# Patient Record
Sex: Male | Born: 1935 | Race: White | Hispanic: No | Smoking: Former smoker
Health system: Southern US, Community
[De-identification: ages and names within clinical notes are randomized; demographics above are authoritative.]

## PROBLEM LIST (undated history)

## (undated) DIAGNOSIS — C801 Malignant (primary) neoplasm, unspecified: Secondary | ICD-10-CM

## (undated) DIAGNOSIS — I00 Rheumatic fever without heart involvement: Secondary | ICD-10-CM

## (undated) DIAGNOSIS — H509 Unspecified strabismus: Secondary | ICD-10-CM

## (undated) HISTORY — PX: APPENDECTOMY: SHX54

## (undated) HISTORY — PX: CHOLECYSTECTOMY: SHX55

## (undated) HISTORY — DX: Rheumatic fever without heart involvement: I00

## (undated) HISTORY — PX: TOTAL HIP ARTHROPLASTY: SHX124

## (undated) HISTORY — PX: NASAL FRACTURE SURGERY: SHX718

## (undated) HISTORY — PX: LOBECTOMY: SHX5089

## (undated) HISTORY — PX: TONSILLECTOMY: SUR1361

## (undated) HISTORY — PX: OTHER SURGICAL HISTORY: SHX169

## (undated) HISTORY — DX: Unspecified strabismus: H50.9

---

## 1998-02-14 ENCOUNTER — Inpatient Hospital Stay (HOSPITAL_COMMUNITY): Admission: RE | Admit: 1998-02-14 | Discharge: 1998-02-17 | Payer: Self-pay

## 2003-03-23 ENCOUNTER — Ambulatory Visit (HOSPITAL_BASED_OUTPATIENT_CLINIC_OR_DEPARTMENT_OTHER): Admission: RE | Admit: 2003-03-23 | Discharge: 2003-03-23 | Payer: Self-pay | Admitting: *Deleted

## 2004-11-06 ENCOUNTER — Ambulatory Visit: Payer: Self-pay | Admitting: Internal Medicine

## 2004-11-22 ENCOUNTER — Ambulatory Visit: Payer: Self-pay | Admitting: Internal Medicine

## 2004-11-23 ENCOUNTER — Ambulatory Visit: Payer: Self-pay | Admitting: Internal Medicine

## 2005-02-19 ENCOUNTER — Ambulatory Visit: Payer: Self-pay | Admitting: Internal Medicine

## 2005-05-27 ENCOUNTER — Inpatient Hospital Stay (HOSPITAL_COMMUNITY): Admission: RE | Admit: 2005-05-27 | Discharge: 2005-05-30 | Payer: Self-pay | Admitting: Orthopaedic Surgery

## 2005-06-07 ENCOUNTER — Ambulatory Visit: Payer: Self-pay | Admitting: Internal Medicine

## 2005-06-21 ENCOUNTER — Ambulatory Visit: Payer: Self-pay | Admitting: Internal Medicine

## 2005-06-24 ENCOUNTER — Ambulatory Visit (HOSPITAL_COMMUNITY): Admission: RE | Admit: 2005-06-24 | Discharge: 2005-06-24 | Payer: Self-pay | Admitting: Internal Medicine

## 2005-06-27 ENCOUNTER — Ambulatory Visit (HOSPITAL_COMMUNITY): Admission: RE | Admit: 2005-06-27 | Discharge: 2005-06-27 | Payer: Self-pay | Admitting: Internal Medicine

## 2005-07-30 ENCOUNTER — Encounter (INDEPENDENT_AMBULATORY_CARE_PROVIDER_SITE_OTHER): Payer: Self-pay | Admitting: *Deleted

## 2005-07-30 ENCOUNTER — Inpatient Hospital Stay (HOSPITAL_COMMUNITY): Admission: RE | Admit: 2005-07-30 | Discharge: 2005-08-04 | Payer: Self-pay | Admitting: Thoracic Surgery

## 2005-08-01 ENCOUNTER — Ambulatory Visit: Payer: Self-pay | Admitting: Internal Medicine

## 2005-08-02 ENCOUNTER — Ambulatory Visit: Payer: Self-pay | Admitting: Internal Medicine

## 2005-08-13 ENCOUNTER — Encounter: Admission: RE | Admit: 2005-08-13 | Discharge: 2005-08-13 | Payer: Self-pay | Admitting: Thoracic Surgery

## 2005-08-28 ENCOUNTER — Encounter: Admission: RE | Admit: 2005-08-28 | Discharge: 2005-08-28 | Payer: Self-pay | Admitting: Thoracic Surgery

## 2005-09-25 ENCOUNTER — Ambulatory Visit: Payer: Self-pay | Admitting: Internal Medicine

## 2005-10-16 ENCOUNTER — Ambulatory Visit: Admission: RE | Admit: 2005-10-16 | Discharge: 2006-01-14 | Payer: Self-pay | Admitting: Radiation Oncology

## 2005-10-16 ENCOUNTER — Encounter: Admission: RE | Admit: 2005-10-16 | Discharge: 2005-10-16 | Payer: Self-pay | Admitting: Thoracic Surgery

## 2005-11-05 ENCOUNTER — Encounter (INDEPENDENT_AMBULATORY_CARE_PROVIDER_SITE_OTHER): Payer: Self-pay | Admitting: Specialist

## 2005-11-05 ENCOUNTER — Inpatient Hospital Stay (HOSPITAL_COMMUNITY): Admission: RE | Admit: 2005-11-05 | Discharge: 2005-11-09 | Payer: Self-pay | Admitting: Thoracic Surgery

## 2005-11-07 DIAGNOSIS — C3492 Malignant neoplasm of unspecified part of left bronchus or lung: Secondary | ICD-10-CM

## 2005-11-19 ENCOUNTER — Ambulatory Visit: Payer: Self-pay | Admitting: Internal Medicine

## 2005-11-19 ENCOUNTER — Encounter: Admission: RE | Admit: 2005-11-19 | Discharge: 2005-11-19 | Payer: Self-pay | Admitting: Thoracic Surgery

## 2005-12-11 ENCOUNTER — Encounter: Admission: RE | Admit: 2005-12-11 | Discharge: 2005-12-11 | Payer: Self-pay | Admitting: Family Medicine

## 2005-12-24 ENCOUNTER — Ambulatory Visit: Payer: Self-pay | Admitting: Internal Medicine

## 2006-02-19 ENCOUNTER — Encounter: Admission: RE | Admit: 2006-02-19 | Discharge: 2006-02-19 | Payer: Self-pay | Admitting: Thoracic Surgery

## 2006-02-27 ENCOUNTER — Ambulatory Visit: Payer: Self-pay | Admitting: Internal Medicine

## 2006-03-03 LAB — CBC WITH DIFFERENTIAL/PLATELET
BASO%: 0.1 % (ref 0.0–2.0)
Eosinophils Absolute: 0.3 10*3/uL (ref 0.0–0.5)
HCT: 40.7 % (ref 38.7–49.9)
HGB: 14.5 g/dL (ref 13.0–17.1)
LYMPH%: 6.8 % — ABNORMAL LOW (ref 14.0–48.0)
MCHC: 35.5 g/dL (ref 32.0–35.9)
MONO#: 1 10*3/uL — ABNORMAL HIGH (ref 0.1–0.9)
NEUT#: 9.2 10*3/uL — ABNORMAL HIGH (ref 1.5–6.5)
NEUT%: 81.3 % — ABNORMAL HIGH (ref 40.0–75.0)
Platelets: 323 10*3/uL (ref 145–400)
WBC: 11.3 10*3/uL — ABNORMAL HIGH (ref 4.0–10.0)
lymph#: 0.8 10*3/uL — ABNORMAL LOW (ref 0.9–3.3)

## 2006-03-03 LAB — COMPREHENSIVE METABOLIC PANEL
ALT: 15 U/L (ref 0–40)
CO2: 24 mEq/L (ref 19–32)
Calcium: 9.4 mg/dL (ref 8.4–10.5)
Chloride: 104 mEq/L (ref 96–112)
Creatinine, Ser: 1.04 mg/dL (ref 0.40–1.50)
Glucose, Bld: 108 mg/dL — ABNORMAL HIGH (ref 70–99)
Total Bilirubin: 0.6 mg/dL (ref 0.3–1.2)
Total Protein: 6.7 g/dL (ref 6.0–8.3)

## 2006-03-06 ENCOUNTER — Ambulatory Visit (HOSPITAL_COMMUNITY): Admission: RE | Admit: 2006-03-06 | Discharge: 2006-03-06 | Payer: Self-pay | Admitting: Internal Medicine

## 2006-03-26 ENCOUNTER — Ambulatory Visit: Payer: Self-pay | Admitting: Internal Medicine

## 2006-05-21 ENCOUNTER — Encounter: Admission: RE | Admit: 2006-05-21 | Discharge: 2006-05-21 | Payer: Self-pay | Admitting: Thoracic Surgery

## 2006-07-04 ENCOUNTER — Ambulatory Visit: Payer: Self-pay | Admitting: Internal Medicine

## 2006-07-31 ENCOUNTER — Ambulatory Visit: Payer: Self-pay | Admitting: Internal Medicine

## 2006-08-29 ENCOUNTER — Ambulatory Visit: Payer: Self-pay | Admitting: Internal Medicine

## 2006-09-03 LAB — CBC WITH DIFFERENTIAL/PLATELET
BASO%: 0.2 % (ref 0.0–2.0)
Basophils Absolute: 0 10*3/uL (ref 0.0–0.1)
HCT: 40.5 % (ref 38.7–49.9)
HGB: 14.2 g/dL (ref 13.0–17.1)
LYMPH%: 10.5 % — ABNORMAL LOW (ref 14.0–48.0)
MCHC: 35 g/dL (ref 32.0–35.9)
MONO#: 1.1 10*3/uL — ABNORMAL HIGH (ref 0.1–0.9)
NEUT%: 74.3 % (ref 40.0–75.0)
Platelets: 323 10*3/uL (ref 145–400)
WBC: 10.5 10*3/uL — ABNORMAL HIGH (ref 4.0–10.0)
lymph#: 1.1 10*3/uL (ref 0.9–3.3)

## 2006-09-03 LAB — COMPREHENSIVE METABOLIC PANEL
BUN: 19 mg/dL (ref 6–23)
CO2: 28 mEq/L (ref 19–32)
Calcium: 9.2 mg/dL (ref 8.4–10.5)
Chloride: 105 mEq/L (ref 96–112)
Creatinine, Ser: 1.06 mg/dL (ref 0.40–1.50)
Glucose, Bld: 95 mg/dL (ref 70–99)
Total Bilirubin: 0.6 mg/dL (ref 0.3–1.2)

## 2006-09-05 ENCOUNTER — Ambulatory Visit (HOSPITAL_COMMUNITY): Admission: RE | Admit: 2006-09-05 | Discharge: 2006-09-05 | Payer: Self-pay | Admitting: Internal Medicine

## 2006-10-22 ENCOUNTER — Ambulatory Visit: Payer: Self-pay | Admitting: Internal Medicine

## 2006-10-28 ENCOUNTER — Encounter: Admission: RE | Admit: 2006-10-28 | Discharge: 2006-10-28 | Payer: Self-pay | Admitting: Thoracic Surgery

## 2006-10-28 ENCOUNTER — Ambulatory Visit: Payer: Self-pay | Admitting: Thoracic Surgery

## 2007-01-23 ENCOUNTER — Ambulatory Visit: Payer: Self-pay | Admitting: Internal Medicine

## 2007-03-02 ENCOUNTER — Ambulatory Visit: Payer: Self-pay | Admitting: Internal Medicine

## 2007-03-04 LAB — CBC WITH DIFFERENTIAL/PLATELET
BASO%: 0.5 % (ref 0.0–2.0)
Eosinophils Absolute: 0.3 10*3/uL (ref 0.0–0.5)
HCT: 41.1 % (ref 38.7–49.9)
MCHC: 35.8 g/dL (ref 32.0–35.9)
MONO#: 0.9 10*3/uL (ref 0.1–0.9)
NEUT#: 8.6 10*3/uL — ABNORMAL HIGH (ref 1.5–6.5)
RBC: 4.56 10*6/uL (ref 4.20–5.71)
WBC: 11.2 10*3/uL — ABNORMAL HIGH (ref 4.0–10.0)
lymph#: 1.3 10*3/uL (ref 0.9–3.3)

## 2007-03-04 LAB — COMPREHENSIVE METABOLIC PANEL
Albumin: 4.2 g/dL (ref 3.5–5.2)
Alkaline Phosphatase: 103 U/L (ref 39–117)
BUN: 15 mg/dL (ref 6–23)
CO2: 26 mEq/L (ref 19–32)
Glucose, Bld: 114 mg/dL — ABNORMAL HIGH (ref 70–99)
Potassium: 4.2 mEq/L (ref 3.5–5.3)
Total Bilirubin: 0.5 mg/dL (ref 0.3–1.2)

## 2007-03-06 ENCOUNTER — Ambulatory Visit (HOSPITAL_COMMUNITY): Admission: RE | Admit: 2007-03-06 | Discharge: 2007-03-06 | Payer: Self-pay | Admitting: Internal Medicine

## 2007-04-22 ENCOUNTER — Ambulatory Visit: Payer: Self-pay | Admitting: Internal Medicine

## 2007-04-28 ENCOUNTER — Encounter: Admission: RE | Admit: 2007-04-28 | Discharge: 2007-04-28 | Payer: Self-pay | Admitting: Thoracic Surgery

## 2007-04-28 ENCOUNTER — Ambulatory Visit: Payer: Self-pay | Admitting: Thoracic Surgery

## 2007-08-03 ENCOUNTER — Ambulatory Visit: Payer: Self-pay | Admitting: Internal Medicine

## 2007-08-31 ENCOUNTER — Ambulatory Visit: Payer: Self-pay | Admitting: Internal Medicine

## 2007-09-02 LAB — COMPREHENSIVE METABOLIC PANEL
Albumin: 4.3 g/dL (ref 3.5–5.2)
BUN: 15 mg/dL (ref 6–23)
CO2: 25 mEq/L (ref 19–32)
Calcium: 9.5 mg/dL (ref 8.4–10.5)
Chloride: 104 mEq/L (ref 96–112)
Glucose, Bld: 97 mg/dL (ref 70–99)
Potassium: 4.7 mEq/L (ref 3.5–5.3)
Sodium: 140 mEq/L (ref 135–145)
Total Protein: 7.1 g/dL (ref 6.0–8.3)

## 2007-09-02 LAB — CBC WITH DIFFERENTIAL/PLATELET
Basophils Absolute: 0 10*3/uL (ref 0.0–0.1)
Eosinophils Absolute: 0.5 10*3/uL (ref 0.0–0.5)
HGB: 14.4 g/dL (ref 13.0–17.1)
MCV: 90.1 fL (ref 81.6–98.0)
MONO#: 0.7 10*3/uL (ref 0.1–0.9)
MONO%: 6.5 % (ref 0.0–13.0)
NEUT#: 8.6 10*3/uL — ABNORMAL HIGH (ref 1.5–6.5)
RBC: 4.52 10*6/uL (ref 4.20–5.71)
RDW: 12.8 % (ref 11.2–14.6)
WBC: 11.6 10*3/uL — ABNORMAL HIGH (ref 4.0–10.0)
lymph#: 1.7 10*3/uL (ref 0.9–3.3)

## 2007-09-04 ENCOUNTER — Ambulatory Visit (HOSPITAL_COMMUNITY): Admission: RE | Admit: 2007-09-04 | Discharge: 2007-09-04 | Payer: Self-pay | Admitting: Internal Medicine

## 2007-09-15 ENCOUNTER — Ambulatory Visit: Payer: Self-pay | Admitting: Thoracic Surgery

## 2007-09-19 DIAGNOSIS — J302 Other seasonal allergic rhinitis: Secondary | ICD-10-CM

## 2007-09-19 DIAGNOSIS — K439 Ventral hernia without obstruction or gangrene: Secondary | ICD-10-CM | POA: Insufficient documentation

## 2007-09-19 DIAGNOSIS — C3490 Malignant neoplasm of unspecified part of unspecified bronchus or lung: Secondary | ICD-10-CM | POA: Insufficient documentation

## 2007-09-19 DIAGNOSIS — K631 Perforation of intestine (nontraumatic): Secondary | ICD-10-CM

## 2007-09-19 DIAGNOSIS — H1045 Other chronic allergic conjunctivitis: Secondary | ICD-10-CM

## 2007-09-19 DIAGNOSIS — J3089 Other allergic rhinitis: Secondary | ICD-10-CM

## 2007-09-21 ENCOUNTER — Ambulatory Visit: Payer: Self-pay | Admitting: Internal Medicine

## 2007-11-11 ENCOUNTER — Ambulatory Visit: Payer: Self-pay | Admitting: Internal Medicine

## 2008-02-16 ENCOUNTER — Ambulatory Visit: Payer: Self-pay | Admitting: Internal Medicine

## 2008-03-01 ENCOUNTER — Ambulatory Visit: Payer: Self-pay | Admitting: Internal Medicine

## 2008-03-03 ENCOUNTER — Ambulatory Visit (HOSPITAL_COMMUNITY): Admission: RE | Admit: 2008-03-03 | Discharge: 2008-03-03 | Payer: Self-pay | Admitting: Internal Medicine

## 2008-03-03 LAB — COMPREHENSIVE METABOLIC PANEL
ALT: 22 U/L (ref 0–53)
AST: 30 U/L (ref 0–37)
BUN: 10 mg/dL (ref 6–23)
CO2: 27 mEq/L (ref 19–32)
Creatinine, Ser: 1.02 mg/dL (ref 0.40–1.50)
Total Bilirubin: 0.9 mg/dL (ref 0.3–1.2)

## 2008-03-03 LAB — CBC WITH DIFFERENTIAL/PLATELET
Basophils Absolute: 0 10*3/uL (ref 0.0–0.1)
EOS%: 4.8 % (ref 0.0–7.0)
HGB: 15.8 g/dL (ref 13.0–17.1)
MCH: 32.4 pg (ref 28.0–33.4)
MONO%: 9 % (ref 0.0–13.0)
NEUT#: 7.3 10*3/uL — ABNORMAL HIGH (ref 1.5–6.5)
RBC: 4.88 10*6/uL (ref 4.20–5.71)
RDW: 13.6 % (ref 11.2–14.6)
lymph#: 1.5 10*3/uL (ref 0.9–3.3)

## 2008-03-10 ENCOUNTER — Ambulatory Visit: Payer: Self-pay | Admitting: Thoracic Surgery

## 2008-06-01 ENCOUNTER — Ambulatory Visit: Payer: Self-pay | Admitting: Internal Medicine

## 2008-08-31 ENCOUNTER — Ambulatory Visit: Payer: Self-pay | Admitting: Internal Medicine

## 2008-09-02 ENCOUNTER — Ambulatory Visit (HOSPITAL_COMMUNITY): Admission: RE | Admit: 2008-09-02 | Discharge: 2008-09-02 | Payer: Self-pay | Admitting: Internal Medicine

## 2008-09-02 LAB — COMPREHENSIVE METABOLIC PANEL
AST: 33 U/L (ref 0–37)
Albumin: 4 g/dL (ref 3.5–5.2)
BUN: 14 mg/dL (ref 6–23)
Calcium: 9.3 mg/dL (ref 8.4–10.5)
Chloride: 103 mEq/L (ref 96–112)
Glucose, Bld: 115 mg/dL — ABNORMAL HIGH (ref 70–99)
Potassium: 3.9 mEq/L (ref 3.5–5.3)
Sodium: 136 mEq/L (ref 135–145)
Total Protein: 7 g/dL (ref 6.0–8.3)

## 2008-09-02 LAB — CBC WITH DIFFERENTIAL/PLATELET
Basophils Absolute: 0 10*3/uL (ref 0.0–0.1)
Eosinophils Absolute: 0.4 10*3/uL (ref 0.0–0.5)
HGB: 15.6 g/dL (ref 13.0–17.1)
NEUT#: 8.8 10*3/uL — ABNORMAL HIGH (ref 1.5–6.5)
RBC: 4.64 10*6/uL (ref 4.20–5.71)
RDW: 12.6 % (ref 11.2–14.6)
WBC: 11.4 10*3/uL — ABNORMAL HIGH (ref 4.0–10.0)
lymph#: 1.2 10*3/uL (ref 0.9–3.3)

## 2008-09-05 ENCOUNTER — Ambulatory Visit: Payer: Self-pay | Admitting: Internal Medicine

## 2008-09-07 ENCOUNTER — Ambulatory Visit: Payer: Self-pay | Admitting: Thoracic Surgery

## 2008-09-07 ENCOUNTER — Encounter: Payer: Self-pay | Admitting: Internal Medicine

## 2008-11-14 ENCOUNTER — Ambulatory Visit: Payer: Self-pay | Admitting: Internal Medicine

## 2008-11-25 ENCOUNTER — Ambulatory Visit: Payer: Self-pay | Admitting: Internal Medicine

## 2008-12-08 ENCOUNTER — Telehealth (INDEPENDENT_AMBULATORY_CARE_PROVIDER_SITE_OTHER): Payer: Self-pay | Admitting: *Deleted

## 2008-12-13 ENCOUNTER — Ambulatory Visit: Payer: Self-pay | Admitting: Internal Medicine

## 2008-12-19 ENCOUNTER — Ambulatory Visit: Payer: Self-pay | Admitting: Internal Medicine

## 2009-02-16 ENCOUNTER — Ambulatory Visit: Payer: Self-pay | Admitting: Internal Medicine

## 2009-02-20 ENCOUNTER — Ambulatory Visit (HOSPITAL_COMMUNITY): Admission: RE | Admit: 2009-02-20 | Discharge: 2009-02-20 | Payer: Self-pay | Admitting: Internal Medicine

## 2009-02-20 LAB — COMPREHENSIVE METABOLIC PANEL
Albumin: 3.8 g/dL (ref 3.5–5.2)
BUN: 13 mg/dL (ref 6–23)
CO2: 26 mEq/L (ref 19–32)
Calcium: 9.3 mg/dL (ref 8.4–10.5)
Chloride: 107 mEq/L (ref 96–112)
Glucose, Bld: 102 mg/dL — ABNORMAL HIGH (ref 70–99)
Potassium: 4.1 mEq/L (ref 3.5–5.3)

## 2009-02-20 LAB — CBC WITH DIFFERENTIAL/PLATELET
Basophils Absolute: 0 10*3/uL (ref 0.0–0.1)
Eosinophils Absolute: 0.3 10*3/uL (ref 0.0–0.5)
HGB: 14.8 g/dL (ref 13.0–17.1)
MCV: 93.9 fL (ref 79.3–98.0)
MONO#: 1.2 10*3/uL — ABNORMAL HIGH (ref 0.1–0.9)
MONO%: 11.3 % (ref 0.0–14.0)
NEUT#: 8 10*3/uL — ABNORMAL HIGH (ref 1.5–6.5)
RDW: 13 % (ref 11.0–14.6)
lymph#: 1.4 10*3/uL (ref 0.9–3.3)

## 2009-02-23 ENCOUNTER — Encounter: Payer: Self-pay | Admitting: Internal Medicine

## 2009-03-01 ENCOUNTER — Ambulatory Visit: Payer: Self-pay | Admitting: Thoracic Surgery

## 2009-03-24 ENCOUNTER — Ambulatory Visit: Payer: Self-pay | Admitting: Internal Medicine

## 2009-03-24 ENCOUNTER — Inpatient Hospital Stay (HOSPITAL_COMMUNITY): Admission: EM | Admit: 2009-03-24 | Discharge: 2009-03-28 | Payer: Self-pay | Admitting: Emergency Medicine

## 2009-03-27 ENCOUNTER — Telehealth (INDEPENDENT_AMBULATORY_CARE_PROVIDER_SITE_OTHER): Payer: Self-pay | Admitting: *Deleted

## 2009-03-27 ENCOUNTER — Encounter: Payer: Self-pay | Admitting: Internal Medicine

## 2009-03-28 ENCOUNTER — Ambulatory Visit: Payer: Self-pay | Admitting: Internal Medicine

## 2009-04-25 ENCOUNTER — Telehealth: Payer: Self-pay | Admitting: Internal Medicine

## 2009-05-09 ENCOUNTER — Telehealth (INDEPENDENT_AMBULATORY_CARE_PROVIDER_SITE_OTHER): Payer: Self-pay | Admitting: *Deleted

## 2009-07-19 ENCOUNTER — Ambulatory Visit: Payer: Self-pay | Admitting: Internal Medicine

## 2009-11-14 ENCOUNTER — Ambulatory Visit: Payer: Self-pay | Admitting: Internal Medicine

## 2009-11-22 ENCOUNTER — Ambulatory Visit: Payer: Self-pay | Admitting: Internal Medicine

## 2010-02-13 ENCOUNTER — Ambulatory Visit: Payer: Self-pay | Admitting: Internal Medicine

## 2010-02-14 ENCOUNTER — Ambulatory Visit (HOSPITAL_COMMUNITY): Admission: RE | Admit: 2010-02-14 | Discharge: 2010-02-14 | Payer: Self-pay | Admitting: Internal Medicine

## 2010-02-14 LAB — CBC WITH DIFFERENTIAL/PLATELET
BASO%: 0.3 % (ref 0.0–2.0)
EOS%: 2.9 % (ref 0.0–7.0)
HCT: 44.2 % (ref 38.4–49.9)
LYMPH%: 11.3 % — ABNORMAL LOW (ref 14.0–49.0)
MCH: 34.9 pg — ABNORMAL HIGH (ref 27.2–33.4)
MCHC: 35.5 g/dL (ref 32.0–36.0)
MCV: 98.1 fL — ABNORMAL HIGH (ref 79.3–98.0)
MONO#: 0.9 10*3/uL (ref 0.1–0.9)
RBC: 4.5 10*6/uL (ref 4.20–5.82)
RDW: 14 % (ref 11.0–14.6)
lymph#: 1.3 10*3/uL (ref 0.9–3.3)

## 2010-02-14 LAB — COMPREHENSIVE METABOLIC PANEL
Alkaline Phosphatase: 78 U/L (ref 39–117)
BUN: 13 mg/dL (ref 6–23)
Potassium: 4.1 mEq/L (ref 3.5–5.3)
Sodium: 140 mEq/L (ref 135–145)
Total Protein: 7.1 g/dL (ref 6.0–8.3)

## 2010-02-21 ENCOUNTER — Encounter: Payer: Self-pay | Admitting: Internal Medicine

## 2010-03-28 ENCOUNTER — Ambulatory Visit: Payer: Self-pay | Admitting: Internal Medicine

## 2010-07-16 ENCOUNTER — Ambulatory Visit: Payer: Self-pay | Admitting: Internal Medicine

## 2010-08-21 ENCOUNTER — Ambulatory Visit: Payer: Self-pay | Admitting: Internal Medicine

## 2010-08-23 ENCOUNTER — Ambulatory Visit (HOSPITAL_COMMUNITY)
Admission: RE | Admit: 2010-08-23 | Discharge: 2010-08-23 | Payer: Self-pay | Source: Home / Self Care | Attending: Internal Medicine | Admitting: Internal Medicine

## 2010-08-23 LAB — COMPREHENSIVE METABOLIC PANEL
Albumin: 4 g/dL (ref 3.5–5.2)
BUN: 12 mg/dL (ref 6–23)
CO2: 29 mEq/L (ref 19–32)
Chloride: 102 mEq/L (ref 96–112)
Creatinine, Ser: 1.07 mg/dL (ref 0.40–1.50)

## 2010-08-23 LAB — CBC WITH DIFFERENTIAL/PLATELET
Basophils Absolute: 0 10*3/uL (ref 0.0–0.1)
Eosinophils Absolute: 0.3 10*3/uL (ref 0.0–0.5)
HCT: 46.4 % (ref 38.4–49.9)
MCHC: 34.9 g/dL (ref 32.0–36.0)
MONO#: 1 10*3/uL — ABNORMAL HIGH (ref 0.1–0.9)
MONO%: 8.2 % (ref 0.0–14.0)
NEUT%: 82.4 % — ABNORMAL HIGH (ref 39.0–75.0)
RBC: 4.76 10*6/uL (ref 4.20–5.82)
RDW: 13.3 % (ref 11.0–14.6)
WBC: 12.2 10*3/uL — ABNORMAL HIGH (ref 4.0–10.3)

## 2010-08-27 ENCOUNTER — Encounter: Payer: Self-pay | Admitting: Internal Medicine

## 2010-10-06 ENCOUNTER — Encounter: Payer: Self-pay | Admitting: Thoracic Surgery

## 2010-10-06 ENCOUNTER — Other Ambulatory Visit: Payer: Self-pay | Admitting: Internal Medicine

## 2010-10-06 DIAGNOSIS — C3490 Malignant neoplasm of unspecified part of unspecified bronchus or lung: Secondary | ICD-10-CM

## 2010-10-07 ENCOUNTER — Encounter: Payer: Self-pay | Admitting: Thoracic Surgery

## 2010-10-16 NOTE — Assessment & Plan Note (Signed)
Summary: 12 months/apc   Primary Provider/Referring Provider:  W. Elkins  CC:  Yearly follow up visit-c/oforehead HA's x 2-3 weeks; congestion and and SOB.Marland Kitchen  History of Present Illness:  HISTORY OF PRESENTING ILLNESS:  This is a pleasant 77 year old Caucasian male who is status post right VATS, right thoracotomy, and right upper lobectomy on 07/30/2005.  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 Lymph nodes were negative for tumor.  The patient subsequently then underwent a left VATS and wedge resection of left upper lobe with seed implantation on 11/05/2005.  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  12-04-08- COPD, recurrent lung ca/ resections/ seeds, allergic rhinitis,  Following with onc- Dr. Shirline Frees. Gets CT every 6 months. Not very active but not bothered by dyspnea. little cough. Some upper airway rattle and postnasal drip. No blood. Had seasonal flu vax.  Had pneumovax 2000.  12/04/2009- COPD, recurrent lung Ca/ resections, seeds, allergic rhinitis Hosp last [place] with wide complex tachcardia. PFT-11/2008, mild restriction, mild obstruction, and mild reduction of DLCO, FEV1/FVC 0.67. Out in pollen yesterday and today complains of frontal headache. Has been sniffing and says headache intermittent x 2 weeks  Continues allergy vaccine at 1:10..  Current Medications (verified): 1)  Allergy Vaccine 1:10 Go (W-E) .... Was On 1:50. Incr To 1:10 Next Ord. 2)  Epipen 2-Pak 0.3 Mg/0.37ml (1:1000)  Devi (Epinephrine Hcl (Anaphylaxis)) .... For Severe Allergic Reaction 3)  Fexofenadine Hcl 180 Mg  Tabs (Fexofenadine Hcl) .Marland Kitchen.. 1 Daily As Needed For Allergy 4)  Advil 200 Mg Tabs (Ibuprofen) .... As Needed 5)  Proair Hfa 108 (90 Base) Mcg/act Aers (Albuterol Sulfate) .... 2 Puffs Four Times A Day As Needed  Allergies (verified): No Known Drug Allergies  Past  History:  Family History: Last updated: Dec 04, 2009 Father- died cardiac arrythmia Mother- died old age  Social History: Last updated: 09/19/2007 Patient states former smoker.  Widowed Truck driver Former Biomedical engineer  Risk Factors: Smoking Status: quit (09/19/2007)  Past Medical History: Rheumatic fever age 44 Tonsillectomy Cholecystectomy R elbow surgery Appendectomy Left total hip replacement 1992, revised 2006 colon resection x 2 for perforated colon-colostomy x 6 months RUL lobectomy for adenoca T2, N0, MX LUL wedge resection for adenoca with radioactive seeds Allergic Rhinitis strabismus  Past Surgical History: Bilateral VATs lung resecctions Nasal surgery Right arm left hip replaced twice colon resection- peritonits after perf. Temp colostomy  Family History: Father- died cardiac arrythmia Mother- died old age  Review of Systems      See HPI       The patient complains of headaches.  The patient denies anorexia, fever, weight loss, weight gain, vision loss, decreased hearing, hoarseness, chest pain, syncope, dyspnea on exertion, peripheral edema, prolonged cough, hemoptysis, abdominal pain, and severe indigestion/heartburn.    Vital Signs:  Patient profile:   77 year old male Weight:      212 pounds O2 Sat:      97 % on Room air Pulse rate:   65 / minute BP sitting:   140 / 80  (left arm) Cuff size:   large  Vitals Entered By: Reynaldo Minium CMA 12/04/09 9:07 AM)  O2 Flow:  Room air  Physical Exam  Additional Exam:  General: A/Ox3; pleasant and cooperative, NAD, overweight, room air sat 97% SKIN: no rash, lesions NODES: no lymphadenopathy HEENT: [place]/AT, right eye drifts up, Conjuctivae- clear, PERRLA, TM-WNL, Nose- thick, Throat- clear and wnl NECK: Supple w/  fair ROM, JVD- none, normal carotid impulses w/o bruits Thyroid- normal to palpation CHEST: few crackles bilaterally HEART: RRR, no m/g/r heard Abdomen- soft QIO:NGEX, nl pulses,  no edema  NEURO: Grossly intact to observation      Impression & Recommendations:  Problem # 1:  ALLERGIC RHINITIS (ICD-477.9) Headache consistent with nasal and sinus congestion. I doubt sinusitis. We will give neb and depo today. I have introduced Neti pot. If this is insufficient will use a nasal steroid. Update Epipen. His updated medication list for this problem includes:    Fexofenadine Hcl 180 Mg Tabs (Fexofenadine hcl) .Marland Kitchen... 1 daily as needed for allergy  Problem # 2:  ADENOCARCINOMA, LUNG, UPPER LOBE, LEFT (ICD-162.3) Hx of resection and seeds. Still following at [facility].  Medications Added to Medication List This Visit: 1)  Epipen 0.3 Mg/0.35ml Devi (Epinephrine) .... For severe allergic reaction  Other Orders: Est. Patient Level III (52841) Admin of Therapeutic Inj  intramuscular or subcutaneous (32440) Depo- Medrol 80mg  (J1040) Nebulizer Tx (10272)  Patient Instructions: 1)  Schedule return in one year, earlier if needed  2)  neb neo nasal 3)  depo  4)  Try the Neti pot otc to rinse your nasaql passages as needed. The instructions are in the box and its easy.. Ask the pharmacist if you have trouble finding it. 5)  Continue allegy shots. 6)  Script to update Epipen to keep on hand in case of a severe allergic reaction  to an allergy shot. Prescriptions: EPIPEN 0.3 MG/0.3ML DEVI (EPINEPHRINE) For severe allergic reaction  #1 x prn   Entered and Authorized by:   Waymon Budge MD   Signed by:   Waymon Budge MD on 11/14/2009   Method used:   Print then Give to Patient   RxID:   [id number]      Medication Administration  Injection # 1:    Medication: Depo- Medrol 80mg     Diagnosis: ALLERGIC RHINITIS (ICD-477.9)    Route: SQ    Site: RUOQ gluteus    Exp Date: 07/2012    Lot #: [id number]    Mfr: Teva    Patient tolerated injection without complications    Given by: Reynaldo Minium CMA (November 14, 2009 9:50 AM)  Medication # 1:    Medication: EMR  miscellaneous medications    Diagnosis: ALLERGIC RHINITIS (ICD-477.9)    Dose: 3drops    Route: intranasal    Exp Date: 01/2011    Lot #: [id number]    Mfr: Bayer    Comments: Neo-Synephrine    Patient tolerated medication without complications    Given by: Leonette Nutting  Orders Added: 1)  Est. Patient Level III [32951] 2)  Admin of Therapeutic Inj  intramuscular or subcutaneous [96372] 3)  Depo- Medrol 80mg  [J1040] 4)  Nebulizer Tx [88416]

## 2010-10-16 NOTE — Miscellaneous (Signed)
Summary: Injection Orders / [facility]    Injection Orders / [facility]    Imported By: Lennie Odor 02/13/2010 15:47:50  _____________________________________________________________________  External Attachment:    Type:   Image     Comment:   External Document

## 2010-10-18 NOTE — Letter (Signed)
Summary: [facility]  [facility]   Imported By: Sherian Rein 09/12/2010 07:21:33  _____________________________________________________________________  External Attachment:    Type:   Image     Comment:   External Document

## 2010-10-19 NOTE — Letter (Signed)
[facility]   Imported By: Sherian Rein 03/12/2010 14:20:12  _____________________________________________________________________  External Attachment:    Type:   Image     Comment:   External Document

## 2010-10-31 ENCOUNTER — Ambulatory Visit (INDEPENDENT_AMBULATORY_CARE_PROVIDER_SITE_OTHER): Payer: Medicare Other

## 2010-10-31 DIAGNOSIS — J301 Allergic rhinitis due to pollen: Secondary | ICD-10-CM

## 2010-11-14 ENCOUNTER — Encounter: Payer: Self-pay | Admitting: Internal Medicine

## 2010-11-14 ENCOUNTER — Ambulatory Visit (INDEPENDENT_AMBULATORY_CARE_PROVIDER_SITE_OTHER): Payer: Medicare Other | Admitting: Internal Medicine

## 2010-11-14 DIAGNOSIS — H1045 Other chronic allergic conjunctivitis: Secondary | ICD-10-CM

## 2010-11-14 DIAGNOSIS — J309 Allergic rhinitis, unspecified: Secondary | ICD-10-CM

## 2010-11-15 DIAGNOSIS — J449 Chronic obstructive pulmonary disease, unspecified: Secondary | ICD-10-CM | POA: Insufficient documentation

## 2010-11-20 ENCOUNTER — Telehealth (INDEPENDENT_AMBULATORY_CARE_PROVIDER_SITE_OTHER): Payer: Self-pay | Admitting: *Deleted

## 2010-11-22 NOTE — Assessment & Plan Note (Signed)
Summary: 1 YEAR RETURN   Primary Provider/Referring Provider:  W. Elkins  CC:  Follow up visit-allergies..  History of Present Illness: 11/16/2008- COPD, recurrent lung ca/ resections/ seeds, allergic rhinitis,  Following with onc- Dr. Shirline Frees. Gets CT every 6 months. Not very active but not bothered by dyspnea. little cough. Some upper airway rattle and postnasal drip. No blood. Had seasonal flu vax.  Had pneumovax 2000.  16-Nov-2009- COPD, recurrent lung Ca/ resections, seeds, allergic rhinitis Hosp last [place] with wide complex tachcardia. PFT-11/2008, mild restriction, mild obstruction, and mild reduction of DLCO, FEV1/FVC 0.67. Out in pollen yesterday and today complains of frontal headache. Has been sniffing and says headache intermittent x 2 weeks  Continues allergy vaccine at 1:10..  November 14, 2010- COPD, recurrent lung Ca/ resections, seeds, allergic rhinitis Nurse-CC: Follow up visit-allergies. He continues under good surveillance for long term f/u of his lung cancers by Drs Shirline Frees and Edwyna Shell, with notes reviewed, latest CT in December, 2011 stable, and 1 year f/u planned.  He got over a bad head cold with little chest involvement.  Denies cough or wheeze. He does notice some itch, sneeze and drainage and comments he lives near [place]. Allegra helps, but generic isn't quite as good.  Asks treatment for watery eyes.  Allergy vaccine GO 1:10 does help. Epipen refil.      Preventive Screening-Counseling & Management  Alcohol-Tobacco     Smoking Status: quit     Year Quit: 1979     Pack years: 20 years 2 packs daily  Current Medications (verified): 1)  Allergy Vaccine 1:10 Go (W-E) .... Was On 1:50. Incr To 1:10 Next Ord. 2)  Epipen 0.3 Mg/0.49ml Devi (Epinephrine) .... For Severe Allergic Reaction 3)  Fexofenadine Hcl 180 Mg  Tabs (Fexofenadine Hcl) .Marland Kitchen.. 1 Daily As Needed For Allergy 4)  Advil 200 Mg Tabs (Ibuprofen) .... As Needed 5)  Proair Hfa 108 (90 Base)  Mcg/act Aers (Albuterol Sulfate) .... 2 Puffs Four Times A Day As Needed  Allergies (verified): No Known Drug Allergies  Past History:  Past Medical History: Last updated: 11/16/09 Rheumatic fever age 7 Tonsillectomy Cholecystectomy R elbow surgery Appendectomy Left total hip replacement 1992, revised 2006 colon resection x 2 for perforated colon-colostomy x 6 months RUL lobectomy for adenoca T2, N0, MX LUL wedge resection for adenoca with radioactive seeds Allergic Rhinitis strabismus  Past Surgical History: Last updated: 16-Nov-2009 Bilateral VATs lung resecctions Nasal surgery Right arm left hip replaced twice colon resection- peritonits after perf. Temp colostomy  Family History: Last updated: 2009-11-16 Father- died cardiac arrythmia Mother- died old age  Social History: Last updated: 09/19/2007 Patient states former smoker.  Widowed Truck driver Former Biomedical engineer  Risk Factors: Smoking Status: quit (11/14/2010)  Review of Systems      See HPI       The patient complains of nasal congestion/difficulty breathing through nose and sneezing.  The patient denies shortness of breath with activity, shortness of breath at rest, productive cough, non-productive cough, coughing up blood, chest pain, irregular heartbeats, acid heartburn, indigestion, loss of appetite, weight change, abdominal pain, difficulty swallowing, sore throat, tooth/dental problems, headaches, ear ache, rash, change in color of mucus, and fever.    Vital Signs:  Patient profile:   77 year old male Height:      69 inches Weight:      204.38 pounds BMI:     30.29 O2 Sat:      96 % on Room air  Pulse rate:   99 / minute BP sitting:   136 / 86  (right arm) Cuff size:   regular  Vitals Entered By: Reynaldo Minium CMA (November 14, 2010 9:50 AM)  O2 Flow:  Room air CC: Follow up visit-allergies.   Physical Exam  Additional Exam:  General: A/Ox3; pleasant and cooperative, NAD,  overweight, room air sat 97% SKIN: no rash, lesions NODES: no lymphadenopathy HEENT: [place]/AT, right eye drifts up, Conjuctivae- clear, PERRLA, TM-WNL, Nose- thick, Throat- clear and wnl NECK: Supple w/ fair ROM, JVD- none, normal carotid impulses w/o bruits Thyroid- normal to palpation CHEST: few crackles bilaterally HEART: RRR, no m/g/r heard Abdomen- soft ZOX:WRUE, nl pulses, no edema  NEURO: Grossly intact to observation      Impression & Recommendations:  Problem # 1:  COPD (ICD-496) Good control. He remains physically active which certainly helps, and is not describing much of an asthma/ bronchitis pattern now.   Problem # 2:  ALLERGIC RHINITIS (ICD-477.9)  Problem # 3:  ALLERGIC RHINITIS (ICD-477.9)  Fair control. he believes allergy vaccine helps. Discussed options in antihistamines.  His updated medication list for this problem includes:    Fexofenadine Hcl 180 Mg Tabs (Fexofenadine hcl) .Marland Kitchen... 1 daily as needed for allergy  Orders: Est. Patient Level III (45409)  Problem # 4:  ALLERGIC CONJUNCTIVITIS (ICD-372.14)  We will let him try otc and prescription aallergy eyedrops- try patanol vs Visine AC or naphcon  Orders: Est. Patient Level III (81191)  Patient Instructions: 1)  Please schedule a follow-up appointment in 1 year. 2)  Continue allergy vaccine 3)  Refill Epipen in case of severe allergic reaction while o allergy vaccine 4)  For otc allergy eyedrops- Try otc Visine AC or Naphcon-A 5)  Compare with sample Patanol 1 drop each eye once daily if needed 6)  Sample Astepro - nasal antihistamine spray 7)      1-2 puffs each nostril up to twice daily as needed. 8)      Using Astepro in your nose might also help allergy in your eyes.  9)  Continue allergy vaccine Prescriptions: EPIPEN 0.3 MG/0.3ML DEVI (EPINEPHRINE) For severe allergic reaction  #1 x prn   Entered and Authorized by:   Waymon Budge MD   Signed by:   Waymon Budge MD on 11/14/2010   Method  used:   Print then Give to Patient   RxID:   [id number]

## 2010-11-27 NOTE — Progress Notes (Signed)
Summary: rx request  Phone Note Call from Patient Call back at Home Phone [phone]   Caller: Patient Call For: young Summary of Call: pt was seen last week. he was given a sample of nasal spray. requests rx be called in to [facility] as this is working well for pt.  Initial call taken by: Tivis Ringer, CNA,  November 20, 2010 9:17 AM  Follow-up for Phone Call        [facility] that rx for Astepro was sent to pharmacy. Abigail Miyamoto RN  November 20, 2010 11:35 AM     New/Updated Medications: ASTEPRO 0.15 % SOLN (AZELASTINE HCL) Use 1 to 2 puffs in each nostril two times a day as needed Prescriptions: ASTEPRO 0.15 % SOLN (AZELASTINE HCL) Use 1 to 2 puffs in each nostril two times a day as needed  #1 x 6   Entered by:   Abigail Miyamoto RN   Authorized by:   Waymon Budge MD   Signed by:   Abigail Miyamoto RN on 11/20/2010   Method used:   Electronically to        [facility]* (retail)       [address] [place], [place]  [address]       Ph: [phone] or [phone]       Fax: [phone]   RxID:   [id number]

## 2010-12-23 LAB — CROSSMATCH

## 2010-12-23 LAB — TSH: TSH: 3.135 u[IU]/mL (ref 0.350–4.500)

## 2010-12-23 LAB — BASIC METABOLIC PANEL
BUN: 27 mg/dL — ABNORMAL HIGH (ref 6–23)
CO2: 27 mEq/L (ref 19–32)
Calcium: 7.9 mg/dL — ABNORMAL LOW (ref 8.4–10.5)
Chloride: 108 mEq/L (ref 96–112)
Creatinine, Ser: 0.92 mg/dL (ref 0.4–1.5)
GFR calc Af Amer: >60 mL/min (ref >60)
GFR calc non Af Amer: >60 mL/min (ref >60)
Glucose, Bld: 100 mg/dL — ABNORMAL HIGH (ref 70–99)
Potassium: 4 mEq/L (ref 3.5–5.1)
Sodium: 142 mEq/L (ref 135–145)

## 2010-12-23 LAB — COMPREHENSIVE METABOLIC PANEL
ALT: 17 U/L (ref 0–53)
Alkaline Phosphatase: 48 U/L (ref 39–117)
BUN: 27 mg/dL — ABNORMAL HIGH (ref 6–23)
CO2: 28 mEq/L (ref 19–32)
CO2: 28 mEq/L (ref 19–32)
Calcium: 9.1 mg/dL (ref 8.4–10.5)
Chloride: 104 mEq/L (ref 96–112)
GFR calc non Af Amer: >60 mL/min (ref >60)
Glucose, Bld: 109 mg/dL — ABNORMAL HIGH (ref 70–99)
Glucose, Bld: 132 mg/dL — ABNORMAL HIGH (ref 70–99)
Potassium: 4.1 mEq/L (ref 3.5–5.1)
Sodium: 138 mEq/L (ref 135–145)
Sodium: 142 mEq/L (ref 135–145)

## 2010-12-23 LAB — DIFFERENTIAL
Basophils Absolute: 0.1 10*3/uL (ref 0.0–0.1)
Eosinophils Absolute: 0.2 10*3/uL (ref 0.0–0.7)
Lymphocytes Relative: 14 % (ref 12–46)
Lymphs Abs: 1.7 10*3/uL (ref 0.7–4.0)
Monocytes Absolute: 1.2 10*3/uL — ABNORMAL HIGH (ref 0.1–1.0)
Monocytes Relative: 10 % (ref 3–12)
Neutro Abs: 8.6 10*3/uL — ABNORMAL HIGH (ref 1.7–7.7)
Neutrophils Relative %: 73 % (ref 43–77)

## 2010-12-23 LAB — CBC
HCT: 21.7 % — ABNORMAL LOW (ref 39.0–52.0)
HCT: 27.3 % — ABNORMAL LOW (ref 39.0–52.0)
HCT: 37.8 % — ABNORMAL LOW (ref 39.0–52.0)
Hemoglobin: 10.4 g/dL — ABNORMAL LOW (ref 13.0–17.0)
Hemoglobin: 13.4 g/dL (ref 13.0–17.0)
Hemoglobin: 7.8 g/dL — CL (ref 13.0–17.0)
Hemoglobin: 9.6 g/dL — ABNORMAL LOW (ref 13.0–17.0)
MCHC: 35.1 g/dL (ref 30.0–36.0)
MCHC: 35.5 g/dL (ref 30.0–36.0)
MCHC: 35.8 g/dL (ref 30.0–36.0)
MCV: 97.4 fL (ref 78.0–100.0)
MCV: 97.7 fL (ref 78.0–100.0)
Platelets: 212 10*3/uL (ref 150–400)
Platelets: 215 10*3/uL (ref 150–400)
Platelets: 299 10*3/uL (ref 150–400)
RBC: 2.23 MIL/uL — ABNORMAL LOW (ref 4.22–5.81)
RBC: 3.11 MIL/uL — ABNORMAL LOW (ref 4.22–5.81)
RDW: 12.9 % (ref 11.5–15.5)
RDW: 16.1 % — ABNORMAL HIGH (ref 11.5–15.5)
RDW: 16.8 % — ABNORMAL HIGH (ref 11.5–15.5)
WBC: 11 10*3/uL — ABNORMAL HIGH (ref 4.0–10.5)
WBC: 11.8 10*3/uL — ABNORMAL HIGH (ref 4.0–10.5)
WBC: 9.1 10*3/uL (ref 4.0–10.5)

## 2010-12-23 LAB — CALCIUM: Calcium: 7.3 mg/dL — ABNORMAL LOW (ref 8.4–10.5)

## 2010-12-23 LAB — CARDIAC PANEL(CRET KIN+CKTOT+MB+TROPI)
CK, MB: 4.5 ng/mL — ABNORMAL HIGH (ref 0.3–4.0)
Troponin I: 0.02 ng/mL (ref 0.00–0.06)

## 2010-12-23 LAB — PROTIME-INR: Prothrombin Time: 14.5 seconds (ref 11.6–15.2)

## 2010-12-23 LAB — HEMOGLOBIN AND HEMATOCRIT, BLOOD
HCT: 25.3 % — ABNORMAL LOW (ref 39.0–52.0)
HCT: 34.9 % — ABNORMAL LOW (ref 39.0–52.0)
Hemoglobin: 12.5 g/dL — ABNORMAL LOW (ref 13.0–17.0)
Hemoglobin: 9 g/dL — ABNORMAL LOW (ref 13.0–17.0)

## 2010-12-23 LAB — PREPARE RBC (CROSSMATCH)

## 2011-01-29 NOTE — Op Note (Signed)
NAMEDOAK, MAH                 ACCOUNT NO.:  [id number]   MEDICAL RECORD NO.:  [id number]          PATIENT TYPE:  INP   LOCATION:  5532                         FACILITY:  [facility]   PHYSICIAN:  Petra Kuba, M.D.    DATE OF BIRTH:  Jan 28, 1931   DATE OF PROCEDURE:  03/25/2009  DATE OF DISCHARGE:                               OPERATIVE REPORT   PROCEDURE:  Esophagogastroduodenoscopy.   INDICATION:  GI bleeding, want to rule out upper source in a patient on  nonsteroidals.  Consent was signed after risks, benefits, methods,  options thoroughly discussed prior to any sedation x2.   MEDICINES USED:  1. Fentanyl 50 mcg.  2. Versed 5 mg.   PROCEDURE:  The videoendoscope was inserted by direct vision.  He did  have a tiny hiatal hernia.  Scope passed into the stomach.  No blood was  seen and advanced to the antrum through the pylorus.  The pylorus was  deformed, but no active ulceration was seen.  It looked like he probably  had some scarring from previous ulcerations, but there was no stricture  and we easily advanced into a normal duodenal bulb and around the C-loop  to a normal second portion of the duodenum.  No blood was seen distally.  Scope was withdrawn back to the bulb and a good look there ruled out  abnormalities in all location.  Scope was withdrawn back to the stomach  and retroflexed.  Cardia, fundus, angularis, lesser and greater curve  were all normal on retroflex straight.  Straight visualization of the  stomach did not reveal any additional findings.  Scope was reinserted  one more time into the duodenum again without abnormalities.  Stomach  was reevaluated one more time just to make sure.  Air was suctioned.  Scope was slowly withdrawn.  Again, a good look at the esophagus was  normal.  Quick look at the vocal cords were normal on withdrawal.  Scope  was removed.  The patient tolerated the procedure well.  There was no  obvious immediate complication.   ENDOSCOPIC  DIAGNOSES:  1. Tiny hiatal hernia.  2. 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  3. Otherwise normal esophagogastroduodenoscopy without any signs of      bleeding.   PLAN:  Flex sig/colonoscopy tomorrow at 8:15.  Continue clear liquids.  We will generally prep this afternoon.  Continue to monitor H and H and  signs of bleeding, might proceed sooner p.r.n. signs of increased  bleeding, might even consider nuclear bleeding scan and suggest using  pump inhibitors p.r.n. and cutting back on his Advil and Aleve.          ______________________________  Petra Kuba, M.D.    MEM/MEDQ  D:  03/25/2009  T:  03/26/2009  Job:  [id number]   cc:   Hospitalist Service

## 2011-01-29 NOTE — Assessment & Plan Note (Signed)
OFFICE VISIT   Kyle Bauer, Kyle Bauer  DOB:  04-27-1931                                        March 10, 2008  CHART #:  [id number]   The patient came today.  He has had a CT scan that showed no evidence of  recurrence of his cancer.  He is going to get another CT scan in 6  months.  He has been followed by Dr. Arbutus Ped and myself.  I will  continue to follow him for another 6 months and then probably refer him  to Dr. Arbutus Ped.  His blood pressure is 151/84, pulse 84, respirations  18, and sats were 98%.  Overall, he is doing well and his seeds look  like they are in good position.   Ines Bloomer, M.D.  Electronically Signed   DPB/MEDQ  D:  03/10/2008  T:  03/10/2008  Job:  [id number]

## 2011-01-29 NOTE — Consult Note (Signed)
Kyle Bauer, Kyle Bauer NO.:  [id number]   MEDICAL RECORD NO.:  [id number]          PATIENT TYPE:  INP   LOCATION:  5532                         FACILITY:  [facility]   PHYSICIAN:  Pricilla Riffle, MD, FACCDATE OF BIRTH:  01/22/1931   DATE OF CONSULTATION:  03/27/2009  DATE OF DISCHARGE:                                 CONSULTATION   PRIMARY CARDIOLOGIST:  Pricilla Riffle, MD, FACC (new)   REASON FOR CONSULTATION:  Wide complex tachycardia.   HISTORY OF PRESENT ILLNESS:  Kyle Bauer is a 77 year old Caucasian male  with no known history of coronary artery disease but risk factors  including his age, sedentary lifestyle, obesity, and 25 years remote 21-  pack-year smoking history, presenting with 10 beats of wide complex  tachycardia in the setting of anemia secondary to a GI bleed.  The  patient reports indigestion, nausea, and self-induced vomiting x1 on  March 23, 2009, two days before admission.  He felt a little better after  vomiting, but on March 24, 2009, he noted frank blood and maroon stools in  the toilet.  He was concerned as he has a history of diverticular  rupture and is status post colon resection in 1999.  At the emergency  department, he was found to have a hemoglobin of 13.4 with stable vital  signs and denied any chest pain or shortness of breath.  Since  admission, his hemoglobin dropped to 7.8 and he required 2 units of  packed red blood cells.  He has thus far had a negative workup for his  GI bleed including a normal EGD and colonoscopy.  The patient is now  asymptomatic with stable vital signs and no acute changes on his EKG.  The patient was also unaware that he had any abnormal rhythm on  telemetry, no palpitations.  He denies presyncope, dyspnea on exertion,  orthopnea, PND, or any recurrent symptoms since admission.  He denies  any previous cardiac workup.   PAST MEDICAL HISTORY:  1. History of bilateral non-small-cell lung cancer, November 2006.  2. 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   PAST SURGICAL HISTORY:  1. History of colon resection/colostomy placement secondary to      perforation in 1999, complications with wound dehiscence and      peritonitis with eventual colostomy takedown and reanastomosis.  2. History of right ulnar nerve decompression in 2004.  3. Left hip replacement in 2006.  4. Right upper lobe lobectomy and thoracotomy in November 2006.  5. Left upper lobe wedge resection with radiation and seed implant,      February 2007.   SOCIAL HISTORY:  The patient lives in [place] alone as he is widowed.  He is retired with a remote 50-pack-year smoking history, quitting 25  years ago.  EtOH:  He has a history of heavy EtOH use, now drinking only  1-3 drinks per day most days of the week.  He denies any illicit drug  use.  He takes vitamin C.  He has been eating a heart healthy diet for  many years and  does no regular exercise recently.   FAMILY HISTORY:  Noncontributory secondary to the patient's advanced  age.   REVIEW OF SYSTEMS:  The patient describes indigestion/epigastric  abdominal pain, nausea, vomiting, and melena as described in HPI.  All  other systems reviewed and are negative.   CODE STATUS:  Full.   ALLERGIES:  NKDA.   MEDICATIONS:  Home medications include allergy injections and Allegra  180 mg p.o. daily.  Inpatient, he is receiving Protonix 40 mg p.o. daily  and normal saline at 100 mL an hour as well as p.r.n. medications.   PHYSICAL EXAMINATION:  VITAL SIGNS:  Temperature 98.2 degrees  Fahrenheit, systolic blood pressure in the last 24 hours has ranged from  98-108, diastolic pressure 53-66, pulse 82, respiration rate 20, O2  saturation 99% on 2 L by nasal cannula, vital signs stable.  GENERAL:  The patient is alert and oriented x3, in no apparent distress.  He is able to move and speak easily without respiratory distress.  HEENT:  His head is normocephalic  and atraumatic.  Pupils are not equal  secondary to right eye with slight deviation; however, both pupils  reactive to light and extraocular muscles are intact, both eyes.  Nares  patent without discharge.  Dentition, upper and lower dentures.  Oropharynx without erythema or exudates.  NECK:  Supple without lymphadenopathy.  No thyromegaly, no JVD, and no  bruits.  HEART:  Rate regular with audible S1, S2.  No clicks, rubs, murmurs, or  gallops.  Pulses are 2+ and equal in both upper and lower extremities  bilaterally.  LUNGS:  Clear to auscultation bilaterally.  SKIN:  No rashes or lesions or petechiae.  ABDOMEN:  Soft, nontender, and nondistended.  Normal abdominal bowel  sounds.  No rebound or guarding.  No hepatosplenomegaly.  No pulsations.  The patient is obese.  Approximately 15-cm midline scar from xiphoid  process to approximately pubic symphysis.  EXTREMITIES:  No clubbing, cyanosis, or edema.  MUSCULOSKELETAL:  No joint deformity or effusion.  No spinal or CVA  tenderness.  NEUROLOGIC:  Cranial nerves II-XII are grossly intact.  Strength 5/5 in  all extremities and axial groups.  Normal sensation throughout and  normal cerebellar function.   RADIOLOGY:  A 2-D echo is pending.  No chest x-ray is available.  EKG  completed on March 25, 2009, shows normal sinus rhythm with no acute ST-T  wave changes.  No significant Q waves.  Normal axis.  No evidence of  hypertrophy.  PR 162, QRS 84, and QTc 460.  No prior tracing for  comparison.   LABORATORY DATA:  WBC 8.7, HGB 9.6, HCT 27.3, and PLT count 177.  Sodium  142, potassium 4.0, chloride 109, CO2 27, BUN 8, creatinine 0.91, and  glucose 100.  TSH 3.135.  Magnesium 2.6 and calcium 7.3.  PT 13.7 and  INR 1.0.  First set of cardiac markers were positive for CK 138 over CK-  MB of 3.3, but troponin I negative at 0.02.   ASSESSMENT AND PLAN:  Kyle Bauer is a 77 year old Caucasian male with  history described above presenting with  wide complex tachycardia (10  beats) in the setting of anemia secondary to gastrointestinal bleed with  a negative workup thus far, negative troponin I x1, no EKG changes,  asymptomatic with 2-D echo results pending.   Wide complex tachycardia.  Magnesium and potassium are okay.  Question  if WCT is ventricular tachycardia.  We will follow up with echo  results  and decide if the patient needs to complete a treadmill stress Myoview  inpatient versus outpatient.  The patient could tolerate low-dose beta-  blockade but would hold off on medication changes and further testing  planning until 2-D echo results are available.   Thank you for the consult.   NOTE:  Attending is yet to review assessment and plan.  Please see  addendum for any changes.      Jarrett Ables, [facility]      Pricilla Riffle, MD, [facility]  Electronically Signed    MS/MEDQ  D:  03/27/2009  T:  03/28/2009  Job:  [id number]

## 2011-01-29 NOTE — Letter (Signed)
March 01, 2009   Lajuana Matte, MD  [phone]  [place], [place] [address]   Re:  Kyle Bauer, Kyle Bauer                 DOB:  1931-02-24   Dear Arbutus Ped,   I saw the patient back today and for the first time he had got a CT  scan, which was completely negative.  It is now over 3 years since we  did his last surgery.  He is doing well overall.  He does not have the  CT scan scheduled over years, so I will let you follow him from now on.  I will be happy to see him if he has any future problems.  His blood  pressure is 171/91, pulse 82, respirations 18, and saturations were 94%.  Lungs were clear to auscultation and percussion.   Ines Bloomer, M.D.  Electronically Signed   DPB/MEDQ  D:  03/01/2009  T:  03/02/2009  Job:  [id number]

## 2011-01-29 NOTE — Assessment & Plan Note (Signed)
OFFICE VISIT   Kyle Bauer, Kyle Bauer  DOB:  1931/06/23                                        September 15, 2007  CHART #:  [id number]   [name] came for follow-up today.  His CT scan showed no evidence of  recurrence.  He is now 18 months since he had surgery and seed  implantation.  His blood pressure is 147/78, pulse 84, respirations 18,  sats were 94%.  Will see him back again in 6 months with another CT  scan.   Ines Bloomer, M.D.  Electronically Signed   DPB/MEDQ  D:  09/15/2007  T:  09/15/2007  Job:  [id number]

## 2011-01-29 NOTE — Assessment & Plan Note (Signed)
OFFICE VISIT   THOMS, BARTHELEMY  DOB:  [birth year]                                        September 07, 2008  CHART #:  [id number]   HISTORY OF PRESENTING ILLNESS:  This is a pleasant 77 year old Caucasian  male who is status post right VATS, right thoracotomy, and right upper  lobectomy on 07/30/2005.  Pathology showed adenocarcinoma,  bronchioloalveolar with microscopic focus of the sinoadenocarcinoma.  Lymph nodes were negative for tumor.  The patient subsequently then  underwent a left VATS and wedge resection of left upper lobe with seed  implantation on 11/05/2005.  Pathology revealed invasive moderately  differentiated adenocarcinoma with bronchioloalveolar features.  Again  lymph nodes were negative for carcinoma.  The patient has been  undergoing periodic surveillance regarding the aforementioned.  He was  last seen in the office on 03/10/2008.  CAT scan of the chest at that  time showed no evidence of recurrence.  Most recent CAT scan done on  09/02/2008 again showed no evidence of recurrence and stable  postoperative changes.  The patient has no complaints at this time.   PHYSICAL EXAMINATION:  GENERAL:  This is a pleasant 77 year old  Caucasian male who is in no acute distress who is alert, oriented, and  cooperative.  CARDIOVASCULAR:  Regular rate and rhythm.  S1 and S2  without murmurs, gallops, or rubs.  PULMONARY:  Clear to auscultation bilaterally.  No rales, wheezes, or  rhonchi.  EXTREMITIES:  No cyanosis, clubbing, or edema.   IMPRESSION AND PLAN:  1. Adenocarcinoma, bronchioloalveolar of the right upper lobe.  2. Invasive moderately differentiated adenocarcinoma with      bronchioloalveolar of the left      upper lobe.  The patient has an appointment to see Dr. Arbutus Ped in 6      months with a CT of the chest.  She will then return for continued      surveillance by Dr. Edwyna Shell shortly thereafter.   Ines Bloomer, M.D.  Electronically Signed   DZ/MEDQ  D:  09/07/2008  T:  09/07/2008  Job:  [id number]   cc:   Lajuana Matte, MD

## 2011-01-29 NOTE — Assessment & Plan Note (Signed)
OFFICE VISIT   [name], [name]  DOB:  08-13-1931                                        April 28, 2007  CHART #:  [id number]   [name] came today.  His blood pressure is 140/81, pulse 85,  respirations 18, sats of 96%.  Lungs are clear to auscultation and  percussion.  His incision is well-healed.  His chest x-ray shows the  seed implantation in the left upper lobe.  He is feeling well overall.  He has had a CT scan 6 weeks ago which was also negative.  Plan to see  him back again in 4 months after his next CT scan.   Ines Bloomer, M.D.  Electronically Signed   DPB/MEDQ  D:  04/28/2007  T:  04/30/2007  Job:  [id number]

## 2011-01-29 NOTE — Discharge Summary (Signed)
Kyle Bauer, Kyle Bauer                 ACCOUNT NO.:  [id number]   MEDICAL RECORD NO.:  [id number]          PATIENT TYPE:  INP   LOCATION:  5532                         FACILITY:  [facility]   PHYSICIAN:  Beckey Rutter, MD  DATE OF BIRTH:  04/07/1931   DATE OF ADMISSION:  03/24/2009  DATE OF DISCHARGE:  03/28/2009                               DISCHARGE SUMMARY   PRIMARY CARE PHYSICIAN:  Windle Guard, MD   CHIEF COMPLAINT:  Rectal bleeding.   HOSPITAL PROCEDURES:  Two-D echo result is pending.  The patient does  not want to wait for the test results as per the Cardiology note because  he is not willing to pursue any further cardiac workup.   The patient had upper endoscopy impression is showing deformed pylorus,  no evidence of bleeding.   The patient had colonoscopy the impression is no evidence of bleeding.  The plan is to consider capsule endoscopy as an outpatient.   The patient was given the phone number of Dr. Ewing Schlein of Gastroenterology  to follow up with.   For the wide-complex tachycardia Cardiology consultation was obtained.  At this time no further testing will be done.   Acute blood loss anemia.  The patient does not require blood  transfusions and his recent hemoglobin is 9.6.  The patient is stable  for discharge.   DISCHARGE DIAGNOSES:  1. Gastrointestinal bleeding etiology unclear, status post upper and      lower endoscopy.  2. Right complex tachycardia at 10 beats.  The patient was advised to      follow up with [facility].  3. History of colon resection and colostomy.  4. Left upper lobe wedge resection/lobectomy/thoracotomy November 2006      and February 2007.   DISCHARGE MEDICATIONS:  The patient will be discharged with Lopressor 25  mg p.o. b.i.d. and Prilosec 20 mg p.o. daily.   Discharge plan and follow ups discussed with the patient.  He is aware  and agreeable to those plans.      Beckey Rutter, MD  Electronically Signed     EME/MEDQ  D:  03/28/2009  T:  03/29/2009  Job:  [id number]

## 2011-01-29 NOTE — Op Note (Signed)
NAMEGARIN, MATA                 ACCOUNT NO.:  [id number]   MEDICAL RECORD NO.:  [id number]          PATIENT TYPE:  INP   LOCATION:  5532                         FACILITY:  [facility]   PHYSICIAN:  Petra Kuba, M.D.    DATE OF BIRTH:  1931-02-17   DATE OF PROCEDURE:  03/26/2009  DATE OF DISCHARGE:                               OPERATIVE REPORT   PROCEDURE:  Colonoscopy.   INDICATIONS:  GI bleeding, nondiagnostic endo yesterday.  Consent was  signed after risks, benefits, methods, options thoroughly discussed  multiple times during this hospital stay.   MEDICINES USED:  1. Fentanyl 62.5.  2. Versed 6.   PROCEDURE:  Rectal inspection is pertinent for external hemorrhoids,  small.  Digital exam was negative.  The video pediatric colonoscope was  inserted and easily advanced around the colon to the cecum.  This did  not require any position changes or any abdominal pressure.  No signs of  bleeding was seen on insertion or any adverse lesion seen.  Anastomosis  was seen in the distal sigmoid.  The cecum was identified by the  appendiceal orifice and the ileocecal valve.  The scope was inserted  short ways in the terminal ileum, which was normal.  Further  documentation was obtained.  No blood was seen coming from the above.  The scope was slowly withdrawn.  He did have a moderate amount of colon  remaining from his surgery but on slow withdrawal through the colon, the  cecum, ascending, transverse, and remaining left side were all normal  without signs of bleeding diverticula, polyps, or AVNs.  The anastomosis  was a colon, end-to-colon site.  We did advance to the end of the  anastomosis which was normal.  The scope was further withdrawn back to  the rectum.  Anorectal pull-through and retroflexion confirmed some  small hemorrhoids.  Scope was drained and readvanced towards up the left  side of the colon to about 50 cm.  Air and water were suctioned.  The  scope was removed.  The  patient tolerated the procedure well.  There was  no obvious immediate complication, not mentioned above.  The prep was  adequate.  There was some liquid dark brown stool which did require  washing and suctioning.   ENDOSCOPIC DIAGNOSES:  1. Small internal and external hemorrhoids.  2. Sigmoid anastomosis end-to-side.  3. Otherwise within normal limits to the terminal ileum without any      blood seen or any adverse lesions.   PLAN:  Consider capsule endoscopy or a CAT scan just to rule out any  significant lesions.  We will advance diet in the meantime.  Follow H  and H.  Try to minimize aspirin and nonsteroidals as an outpatient if  possible, happy to see him back p.r.n.           ______________________________  Petra Kuba, M.D.    MEM/MEDQ  D:  03/26/2009  T:  03/27/2009  Job:  [id number]

## 2011-01-29 NOTE — Consult Note (Signed)
NAMEMAGNUM, LUNDE                 ACCOUNT NO.:  [id number]   MEDICAL RECORD NO.:  [id number]          PATIENT TYPE:  INP   LOCATION:  5532                         FACILITY:  [facility]   PHYSICIAN:  Petra Kuba, M.D.    DATE OF BIRTH:  04-Sep-1931   DATE OF CONSULTATION:  03/25/2009  DATE OF DISCHARGE:                                 CONSULTATION   HISTORY:  The patient seen at request of the hospital team for GI  bleeding.  It has been mostly bright and dark red, it  started yesterday  at home.  He has not had any previous bleeding before and he is not  aware of any GI procedures that he has had.  He has had some mild upper  tract symptoms, which Tums has helped, but not had any bowel problems  since his significant diverticular surgery that included an ostomy and a  redo, which was in 1999.  He has no other complaints.   PAST MEDICAL HISTORY:  Pertinent for lung cancer x2 with the colonic  surgery as mentioned above and a history of some allergies.   His only medicine is Allegra and allergy shots.   FAMILY HISTORY:  Negative for an GI issues.   SOCIAL HISTORY:  He does not smoke, but does drink.  He does use some  Advil as above.   REVIEW OF SYSTEMS:  Negative except above.   ALLERGIES:  To no known medicines.   PHYSICAL EXAMINATION:  GENERAL:  No acute distress, lying comfortably in  the bed.  VITAL SIGNS:  Stable afebrile.  He did have some dark or maroon blood in  the commode.  No clots.   ASSESSMENT:  Gastrointestinal bleeding and the patient is on Advil.   PLAN:  The risks, benefits, methods of an endoscopy and possibly a  colonoscopy, even though he states he only has about foot of colon were  discussed.  He will go ahead and proceed with an endoscopy later today  to rule out any upper tract lesions with further workup and plans  pending those findings and he agrees with the plan.           ______________________________  Petra Kuba, M.D.     MEM/MEDQ  D:   03/25/2009  T:  03/25/2009  Job:  [id number]

## 2011-01-29 NOTE — H&P (Signed)
NAMEMARCELUS, DUBBERLY                 ACCOUNT NO.:  [id number]   MEDICAL RECORD NO.:  [id number]          PATIENT TYPE:  INP   LOCATION:  5532                         FACILITY:  [facility]   PHYSICIAN:  Della Goo, M.D. DATE OF BIRTH:  30-Jun-1931   DATE OF ADMISSION:  03/24/2009  DATE OF DISCHARGE:                              HISTORY & PHYSICAL   PRIMARY CARE PHYSICIAN:  Windle Guard, M.D.   CHIEF COMPLAINT:  Rectal bleeding.   HISTORY OF PRESENT ILLNESS:  This is a 77 year old male who presents to  the emergency department secondary to complaints of four episode of  maroon colored rectal bleeding which started this a.m.  The patient  reports having crampy lower abdominal discomfort and reports going to  the restroom and passing maroon colored stool.  The patient denies  having any nausea or vomiting.  He denies having any weakness or  lightheadedness or chest pain or palpitations.  The patient reports  having two further episodes during the day and after the last episode,  went to the emergency department at about 5:00 p.m..   The patient was seen and evaluated in the emergency department. He was  found to have a hemoglobin level of 13.4 on the initial evaluation. He  was referred for medical admission.  The patient denies taking any blood  thinners or increased aspirin or NSAIDS.   PAST MEDICAL HISTORY:  1. History of bilateral non-small-cell lung cancer diagnosed in      November 2006.  2. History of perforated viscus probable diverticular rupture and      1999.   PAST SURGICAL HISTORY:  1. History of colon resection status post colostomy placement      secondary to perforation in 1999 then complications with wound      dehiscence and peritonitis and eventual colostomy takedown and re-      anastomosis.  2. History of a right ulnar nerve decompression 2004.  3. Left hip replacement 2006.  4. Right upper lobe lobectomy and thoracotomy in November 2006.  5. Left upper lobe  wedge resection with radiation seed implant and      February 2007.   MEDICATIONS:  At this time include allergy injections and Allegra 180 mg  one p.o. daily   ALLERGIES:  NO KNOWN DRUG ALLERGIES.   SOCIAL HISTORY:  The patient is a nonsmoker.  He does report drinking  alcohol.  He states he drinks less than he had in the past and reports  drinking one red wine daily and two beers.   FAMILY HISTORY:  Noncontributory secondary to the patient's age.   REVIEW OF SYSTEMS:  Pertinent's are mentioned above.  All other organ  systems are negative.   PHYSICAL EXAMINATION:  GENERAL:  This is a 77 year old elderly, well-  nourished, well-developed male in no visible discomfort or acute  distress.  VITAL SIGNS:  Temperature 98.5, blood pressure 141/75, heart rate 97,  respirations 16, O2 sat's 95-96%.  HEENT: Examination normocephalic, atraumatic.  There is no scleral  icterus.  Pupils equally round reactive to light.  Extraocular movements  are intact  except the patient does have a lazy right eye.  Nares are  patent bilaterally.  Oropharynx is clear.  He is edentulous with  dentures on both levels.  NECK:  Supple, full range of motion.  No thyromegaly, adenopathy,  jugular venous distention.  CARDIOVASCULAR:  Regular rate and rhythm.  No murmurs, gallops or rubs.  LUNGS:  Clear to auscultation bilaterally.  ABDOMEN:  Positive surgical midline scar from previous colon resection  and left lower quadrant surgical scar consistent with colostomy  placement in the past.  Positive bowel sounds, soft, nontender,  nondistended.  Obese.  No hepatosplenomegaly.  No rebound no guarding.  EXTREMITIES:  Without cyanosis, clubbing or edema.  NEUROLOGIC:  The patient is alert and oriented x3.  Cranial nerves are  intact.  Motor and sensory function also intact.  There are no focal  deficits on examination.   LABORATORY STUDIES:  White blood cell count 11.8, hemoglobin 13.4,  hematocrit 37.8, MCV  97.4, platelets 299,000, neutrophils 73%.  Lymphocytes 14%.  Sodium 142, potassium 4.0, chloride 104, carbon  dioxide 28, BUN 27, creatinine 1.07 and glucose 132, albumin 3.7, AST  30, ALT 27, pro time of 14.5 and INR 1.1.   ASSESSMENT:  A 77 year old male being admitted with:  1. Rectal bleeding.  2. Abdominal pain.  3. Stress leukocytosis.  4. History of bilateral lung cancer and reported remission at this      time.   PLAN:  The patient will be admitted to telemetry area.  The patient will  be placed on clear liquids and IV fluids and serial H&H's will be  performed x48 hours to evaluate for possible decreases in his hemoglobin  level.  A GI consultation will be placed for further evaluation of the  patient's rectal bleeding and 2 units of packed red blood cells have  been ordered and placed on hold in the event of a needed transfusion.  The patient will be placed on IV Protonix therapy q.12 hours and SCD's  have been ordered for DVT prophylaxis.      Della Goo, M.D.  Electronically Signed     HJ/MEDQ  D:  03/24/2009  T:  03/24/2009  Job:  [id number]

## 2011-02-01 NOTE — H&P (Signed)
NAMEMARCANTHONY, [name]                 ACCOUNT NO.:  [id number]   MEDICAL RECORD NO.:  [id number]           PATIENT TYPE:   LOCATION:                                 FACILITY:   PHYSICIAN:  Ines Bloomer, M.D.      DATE OF BIRTH:   DATE OF ADMISSION:  11/05/2005  DATE OF DISCHARGE:                                HISTORY & PHYSICAL   CHIEF COMPLAINT:  Left lung mass.   HISTORY OF PRESENT ILLNESS:  This 77 year old patient was found to have  bilateral lung lesions and had a PET scan that was positive on each side.  He had been evaluated for knee surgery by Dr. Ophelia Charter when he was found to  have these lesions.  His pulmonary function tests showed an FVC of 3.22, an  FEV1 of 2.42.  He has been previously treated with allergies by Dr. Fannie Knee as well as Dr. Jeannetta Nap.  He has had no hemoptysis, fever, chills,  excessive sputum, or weight loss.  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  The left upper lobe lesion had an SUV of 8.3 and a follow-  up CT scan done approximately two weeks ago showed no evidence of  recurrence.  He has also been seen by Dr. Arbutus Ped for the oncology service  and he is being admitted for wedge resection, node sampling, and seed  implantation.  His initial hospital course was negative.   MEDICATIONS:  Allegra for allergies.  He was on Percocet and then Lorcet  Plus for pain medication.   No drug allergies.   PAST MEDICAL HISTORY:  He had a perforated colon with a colostomy and  several operations and ventral hernia repair by Dr. Orson Slick 1999.   SOCIAL HISTORY:  He is widowed.  Has four children.  Quit smoking 20 years  ago.  Does not drink alcohol on a regular basis.  He is retired.   REVIEW OF SYSTEMS:  He is 183 pounds.  He is 5 feet 9 inches.  Weight has  essentially been stable.  CARDIAC:  There is no angina or atrial  fibrillation.  PULMONARY:  See history of  present illness.  GI:  There is no  melena, peptic ulcer disease.  See past medical history.  GU:  No dysuria,  frequent urination, or kidney stones.  VASCULAR:  No TIAs, claudication, or  DVT.  NEUROLOGIC:  No headaches, blackouts, or seizures.  ORTHOPEDIC:  He  has chronic arthritis and joint pain and, as mentioned, see history of  present illness.  He has been evaluated for knee surgery by Dr. Ophelia Charter.  PSYCHIATRIC:  No psychiatric illness.  HEENT:  No change in his eyesight or  hearing.  HEMATOLOGICAL:  No anemia or problems with bleeding.   PHYSICAL EXAMINATION:  GENERAL:  He is a well-developed Caucasian male in no  acute distress.  VITAL SIGNS:  Blood pressure 140/80, pulse 96, respirations 18, saturations  96%.  HEENT:  Head is atraumatic.  Eyes:  Pupils are equal, round, and reactive to  light and accommodation.  Extraocular movements are normal.  Ears:  Tympanic  membranes are intact.  Nose:  There is no septal deviation.  Throat is  without lesion.  NECK:  Supple with no thyromegaly.  No supraclavicular or axillary  adenopathy.  No carotid bruits.  CHEST:  Clear to auscultation, percussion.  There is a right thoracotomy  scar.  HEART:  Regular sinus rhythm.  No murmurs.  ABDOMEN:  Multiple surgical scars.  Bowel sounds are normal.  There is no  hepatosplenomegaly.  EXTREMITIES:  Pulses are 2+.  There is no clubbing or edema.  NEUROLOGIC:  He is oriented x3.  Cranial nerves II-XII are intact.  Sensory  and motor are intact.  SKIN:  Without lesion.   IMPRESSION:  1.  Status post right upper lobectomy for stage IB non-small cell lung      cancer.  2.  History of perforated viscus status post colostomy.  3.  Left upper lobe lesion.  4.  Chronic obstructive pulmonary disease.   PLAN:  Left VATS, wedge resection left upper lobe with seed implantation.           ______________________________  Ines Bloomer, M.D.     DPB/MEDQ  D:  10/30/2005  T:  10/30/2005  Job:   [id number]

## 2011-02-01 NOTE — Op Note (Signed)
[name], Kyle Bauer                 ACCOUNT NO.:  [id number]   MEDICAL RECORD NO.:  [id number]          PATIENT TYPE:  INP   LOCATION:  2550                         FACILITY:  [facility]   PHYSICIAN:  Ines Bloomer, M.D. DATE OF BIRTH:  [birth year]   DATE OF PROCEDURE:  11/05/2005  DATE OF DISCHARGE:                                 OPERATIVE REPORT   PREOPERATIVE DIAGNOSIS:  Nonsmall cell lung cancer, right upper lobe; left  upper lobe nodule.   POSTOPERATIVE DIAGNOSIS:   OPERATION PERFORMED:  Left video assisted thoracoscopic surgery, wedge  resection left upper lobe with seed implantation.   SURGEON:  Ines Bloomer, M.D.   ASSISTANT:  Pecola Leisure, PA  Artist Pais. Kathrynn Running, M.D.   ANESTHESIA:  General.   INDICATIONS FOR PROCEDURE:  This 77 year old patient had had a right upper  lobectomy for bronchoalveolar adenocarcinoma.  At the time, he was found to  have a left upper lobe nodule that was hot on PET with an SUV  of 8.6.  It  was decided to do a wedge resection of this to conserve as much lung as  possible.   DESCRIPTION OF PROCEDURE:  He was turned to the left lateral thoracotomy  position, dual lumen tube was inserted.  Two trocar sites were made in the  anterior and posterior axillary line at the seventh intercostal space and  the anterior one in the midaxillary line at the seventh intercostal space.  Two trocars were inserted.  A 0 degree scope was inserted and the lesion was  seen in the anterior segment of the left upper lobe.  A 5 cm, maybe 6 cm  incision was made over the 4th intercostal space anteriorly, splitting the  serratus.  This was anterior to the latissimus dorsi muscle.  A small  retractor was inserted to keep the tissues out of the interspace and then  through the interspace, we grasped the lesion with a Kaiser ring forceps and  then coming in from the anterior trocar site, the lesion was resected with  four applications of the EZ-45 stapler.  It  was then placed in a bag and  sent for frozen section.  It was adenocarcinoma with good margins.  After  this had been done, the scope was used to look for nodes and at the anterior  hilum we biopsied __________ node, 11L and 10L node.  Then an On-Q catheter  was inserted subpleurally in the usual fashion and a Marcaine block done in  the usual fashion.  Since the pathology came back adenocarcinoma, the seeds  were prepared by Dr. Kathrynn Running and then we sutured them in place using 5  Prolene sutures, 4-0 Prolene sutures, one at the superior portion of the  staple line, one in the inferior portion of the staple line, one right where  the cancer was and two lateral sutures.  The three along the staple line  were tied down and the  two lateral ones were clipped with clips to allow lung expansion.  The lung  was then expanded and the lesions looked good.  Chest  was closed with one  pericostal of #1 Vicryl in the muscle layer, 2-0 Vicryl in the subcutaneous  tissue and Dermabond for the skin.  The patient was then transferred to the  recovery room in stable condition.           ______________________________  Ines Bloomer, M.D.     DPB/MEDQ  D:  11/05/2005  T:  11/05/2005  Job:  [id number]   cc:   Artist Pais Kathrynn Running, M.D.  Fax: [phone]

## 2011-02-01 NOTE — Op Note (Signed)
NAME:  Kyle Bauer, Kyle Bauer                 ACCOUNT NO.:  [id number]   MEDICAL RECORD NO.:  [id number]          PATIENT TYPE:  INP   LOCATION:  3310                         FACILITY:  [facility]   PHYSICIAN:  Ines Bloomer, M.D. DATE OF BIRTH:  1930-09-29   DATE OF PROCEDURE:  07/30/2005  DATE OF DISCHARGE:                                 OPERATIVE REPORT   PREOPERATIVE DIAGNOSIS:  Right upper lobe and left upper lobe lesion.   POSTOPERATIVE DIAGNOSIS:  Right upper lobe and left upper lobe lesion.   OPERATION PERFORMED:  Right video assisted thoracoscopic surgery, right  thoracotomy, right upper lobectomy.   SURGEON:  Ines Bloomer, M.D.   FIRST ASSISTANT:  Jerold Coombe, P.A.-C.   This patient was found to have right upper lobe and left upper lobe lesion  with positive PET scans with an SUV of 5 on the right and 8.6 on the left.  The right lesion was larger and cavitary and he was brought to the operating  room for excision of this.  He underwent general anesthesia, he was turned  to the right lateral thoracotomy position, he was prepped and draped in the  usual sterile fashion.  Two trocar sites were made in the anterior and  posterior axillary line at the seventh intercostal space and two trocars  were inserted.  The 0 degree scope was inserted.  The lesion was seen in the  apical and posterior segment of the right upper lobe.  A posterolateral  thoracotomy was made over the fifth intercostal space.  The latissimus was  partially divided, the serratus was reflected anteriorly, the fifth  intercostal space was entered.  Two Tuffiers were inserted and dissection  was started posteriorly dissecting down through posterior mediastinum.  The  superior portion of the fissure was divided with clips.  This exposed the  posterior branch of the upper lobe.  Several 10R and 11R nodes were  dissected free, these were real small in nature, and then the posterior  branch of the lesion was  excised with the autosuture 30 white roticulator.  Attention was turned superiorly and the apical posterior branch was  dissected out as well as the superior pulmonary branch and this was stapled  and divided with the autosuture stapler, then the apical posterior branch  was divided with the autosuture stapler.  Another 10R node was dissected  free and the fissure was divided with the Eschelon-60 stapler and EZ-45  stapler.  The bronchus was divided with the EZ-45 stapler.  The right upper  lobe was removed.  The frozen section revealed adenocarcinoma,  bronchoalveolar type.  The inferior pulmonary ligament was taken down with  electrocautery.  Two chest tubes were brought in through the trocar sites.  A Marcaine block was done in the usual  fashion.  The On-Q catheter was placed in the usual fashion subpleurally.  The chest was closed with four pericostals, #1 Vicryl in the muscle layer, 2-  0 Vicryl in the subcutaneous tissue, and Ethicon skin clips.  The patient  tolerated the procedure well and was returned to  the recovery room in stable  condition.           ______________________________  Ines Bloomer, M.D.     DPB/MEDQ  D:  07/30/2005  T:  07/30/2005  Job:  [id number]   cc:   Joni Fears D. Maple Hudson, M.D.  [facility] Dept  [address]  [place]  [place] [address]

## 2011-02-01 NOTE — Discharge Summary (Signed)
Kyle Bauer, Kyle Bauer                 ACCOUNT NO.:  [id number]   MEDICAL RECORD NO.:  [id number]          PATIENT TYPE:  INP   LOCATION:  2022                         FACILITY:  [facility]   PHYSICIAN:  Ines Bloomer, M.D. DATE OF BIRTH:  [birth year]   DATE OF ADMISSION:  07/30/2005  DATE OF DISCHARGE:  08/03/2005                                 DISCHARGE SUMMARY   ADMISSION DIAGNOSIS:  Bilateral upper lobe lung lesions.   SECONDARY DIAGNOSES:  1.  Bilateral upper lobe lung lesions status post right upper lobectomy with      lymph node dissection.  Pathology positive for bronchoalveolar      adenocarcinoma (T2, N0, MX).  2.  Postoperative hyperglycemia, improved.  3.  History of colon resection x2 for perforated colon with colostomy x6      months.  4.  History of tonsillectomy and adenoidectomy.  5.  History of appendectomy.  6.  Cholecystectomy.  7.  Right elbow surgery.  8.  Left total hip replacement in 1992 requiring revision in September of      2006.  9.  History of ventral hernia repair.  10. Rheumatic fever at age 80.  65. No known drug allergies.  12. History of tobacco use but quit 20 years ago.   PROCEDURE:  July 30, 2005, right video assisted thoracoscopic surgery,  right thoracotomy, right upper lobectomy and lymph node dissection by Ines Bloomer, M.D.   BRIEF HISTORY:  Kyle Bauer is a 77 year old male with long history of smoking  but quit 20 years ago.  He was being evaluated for knee surgery by Dr. Ophelia Charter  and was found to have an abnormal chest x-ray which showed a cavitary right  upper lobe lesion and a 1 cm left upper lobe lesion.  PET scan was done  which was positive, __________ of 8.3 in the left upper lobe and 5.0 in the  right upper lobe.  There was no adenopathy.  Pulmonary function test showed  a FVC of 3.22 and FEV1 of 2.42.  He has been previously treated for  allergies by Dr. Jetty Duhamel as well as Dr. Jeannetta Nap.  He denied hemoptysis,  fever,  chills, excessive sputum or weight loss.  Due to the finding of  bilateral lung lesions, he was referred to Dr. Edwyna Shell for consideration of  surgical resection versus lobectomy.   CONSULTATION:  Lajuana Matte, M.D., oncology.   HOSPITAL COURSE:  On July 30, 2005, Kyle Bauer was electively admitted to  [facility]. [facility] and did undergo her right upper lobectomy as  reported above.  There were no known intraoperative complications and he was  extubated neurologically intact.  After short stay in the recovery unit, he  was transferred to stepdown unit 3300 with two right chest tubes.  A  morphine PCA pump then on-cue system for pain management.  At the time of  this dictation, he has had a relatively uneventful postoperative course.   On postoperative day #1, he remained stable.  Pleur-Evac drainage system did  show a small 1/7 air leak  and a chest tube continued to suction.   On postoperative day #2, he continued with a small air leak but more  intermittent.  His posterior chest tube was discontinued as well as his on-  cue device.  By the time  his pathology results were back showing  adenocarcinoma, bronchoalveolar, nonmucinous with sclerosis with microscopic  focus of __________ adenocarcinoma focally involving visceral pleura (T2,  N0, MX).  Subsequently an oncology consult was requested and he was seen by  Dr. Velora Heckler [facility] physician's assistant, with plans for further work-  up on outpatient basis on August 22, 2006.  On postoperative day #3, his  chest tube showed no air leak.  A chest x-ray remained stable and chest tube  was discontinued without incident with follow-up chest x-ray showing no  pneumothorax, stable cardiomegaly and pulmonary vascular congestion.  Mild  chronic interstitial lung disease and right lateral pleural thickening or  fluid.  Following removal of his chest tubes, his morphine PCA was  discontinued but he was continued on oral  oxycodone and Tylox.  He remained  hemodynamically stable and afebrile with last vital signs showing  temperature 97.8, blood pressure 135/65, heart rate around 90 and in sinus  rhythm and oxygen saturation 94% on room air.  He was voiding without  difficulty following Foley catheter removal.  He was tolerating a regular  diet although this had been changed to a carbohydrate modified diet for mild  postoperative hyperglycemia with blood sugar at 144.  His follow-up blood  sugar, however, was normal at 103.  His other labs also remained stable,  although he did require treatment for mild hypokalemia with potassium of 3.4  with follow-up potassium normal at 3.8.  Other labs included white blood  cell count of 12.1, hemoglobin 13.1, hematocrit 36.7, platelet count 233.  Sodium 137, BUN 7, creatinine 0.8.  Liver function test normal with AST of  37, ALT 20, alkaline phosphatase 83, total bilirubin 1.1.  Albumin was  decreased at 2.8 with preoperative albumin normal at 3.8.  His incisions  were also healing well without signs of infection and he was able to  ambulate without difficulty.  If his follow-up chest x-ray remains stable  and continues to make good progress, it is anticipated that he will be ready  to be discharged home on August 03, 2005, or August 04, 2005.   DISCHARGE MEDICATIONS:  1.  Allegra one tablet daily at his home dose.  2.  Vitamin C 500 mg daily.  3.  Weekly allergy injections per his preoperative regimen.  4.  Tylox one to two tablets p.o. q.4h. p.r.n. pain.   DISCHARGE INSTRUCTIONS:  He is instructed to resume his preoperative diet.  He is to increase his activity slowly but should avoid driving or heavy  lifting for the next two weeks.  He was encouraged to continue daily walking  and breathing exercises.  He may shower and clean his incisions gently with  mild soap and water.  He should notify the [facility] office if he develops fever greater than 101, redness or  drainage from his incision sites or increased  shortness of breath.   FOLLOW UP:  1.  He is to follow up with Dr. Edwyna Shell at the [facility] office on August 13, 2005, at 4:50 p.m. with a chest x-ray at [facility] at 3:50 p.m.      He is to bring the chest x-ray film with him to his  appointment with Dr.      Edwyna Shell.  2.  He is to follow up with Lajuana Matte, M.D., at the [facility]      on August 22, 2005, at 11:30 a.m.      Kyle Bauer, P.A.    ______________________________  Ines Bloomer, M.D.    AWZ/MEDQ  D:  08/02/2005  T:  08/03/2005  Job:  [id number]   cc:   Lajuana Matte, MD  Fax: [phone]   Clinton D. Maple Hudson, M.D.  [facility] Dept  [address]  [place]  [place] [address]   Windle Guard, M.D.  Fax: [phone]   Veverly Fells. Ophelia Charter, M.D.  Fax: [phone]

## 2011-02-01 NOTE — Op Note (Signed)
Kyle Bauer, Kyle Bauer                 ACCOUNT NO.:  [id number]   MEDICAL RECORD NO.:  [id number]          PATIENT TYPE:  INP   LOCATION:  2854                         FACILITY:  [facility]   PHYSICIAN:  Mark C. Ophelia Charter, M.D.    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   PROCEDURE:  Left total hip arthroplasty revision (poly and ball exchange,  capsulectomy).   SURGEON:  Mark C. Ophelia Charter, M.D.   ASSISTANT:  Lynford Citizen, RNFA   ANESTHESIA:  General.   BRIEF HISTORY:  This 77 year old male 15 years ago had total hip  arthroplasty done with esoteric polyethylene wear and now with a broken wire  liner locking mechanism.  He has had some pain in the hip and x-rays  demonstrate widening of the canal at the distal tip of the femoral stem  without obvious subsidence.   DESCRIPTION OF PROCEDURE:  After induction of general anesthesia, the  patient received preoperative vancomycin and was placed in the lateral  position with prepping of the hip with DuraPrep.  Impervious stockinette,  Coban, and the usual total hip sheets and drapes were applied.  The old skin  incision was outlined with a skin marker.  Betadine and Vi-Drape were  applied x 2 sealing the operative site.   The old incision was opened, subcutaneous tissues sharply dissected with the  Bovie electrocautery.  The tensor fascia opening was opened removing the old  sutures.  The posterior capsule and bursa was resected.  There were some  rice bodies with minimal amount of fluid present.  The capsule was  thickened.  The cup was loose inside the shell with finger pressure.  The  hip was dislocated, the capsule was excised as necessary for  mobilization.  A sharp Rogelia Rohrer was placed anteriorly and the trunnion was moved superior  anterior.  Thick chunks of fibrous tissue was resected exposing the rim.  The osteotome was used to remove the cup which had mild loosening and then a  second was placed in and the cup was completely popped loose.  The broken  ring wire was removed.  The cup was removed.  There was eccentric wear.  It  was 52 with 26 ball, it was elected to proceed with changing to a 32 ball  for better stability.  It was a plus 0 neck and once small screw holes were  debrided of fibrous tissues from the polyethylene wear and loosening, there  was no obvious fluid back behind the cup.  The cup shell itself was secure  which was a dual geometry cup.  The new poly was popped in.  Trial ball of  plus 0 giving excellent stability.  The permanent 32 mm ball plus 0 was  popped onto the head after cleaning the trunnion, impacted, tested, and  then  reduced.  Identical findings of stability.  The tensor fascia was closed  with 0 Tycron.  Copious irrigation had been  used prior to changing the inserts after removal of the broken liner and  ball and then repeat irrigation prior to closure.  0 Vicryl in the gluteus  maximus fascia, 2-0 Vicryl in the subcutaneous tissue, skin staple closure.  Postop dressing and knee immobilizer.  Instrument count and needle counts  were correct.      Mark C. Ophelia Charter, M.D.  Electronically Signed     MCY/MEDQ  D:  05/27/2005  T:  05/27/2005  Job:  [id number]

## 2011-02-01 NOTE — Consult Note (Signed)
Kyle Bauer, Kyle Bauer                 ACCOUNT NO.:  [id number]   MEDICAL RECORD NO.:  [id number]          PATIENT TYPE:  INP   LOCATION:  2022                         FACILITY:  [facility]   PHYSICIAN:  Lajuana Matte, MD  DATE OF BIRTH:  12-Nov-1930   DATE OF CONSULTATION:  08/01/2005  DATE OF DISCHARGE:  08/04/2005                                   CONSULTATION   REASON FOR CONSULTATION:  Lung cancer.   REFERRING PHYSICIAN:  Dr. Edwyna Bauer   HISTORY OF PRESENT ILLNESS:  Mr. Kyle Bauer is a pleasant 77 year old prior  smoker, male, found to have a cavitary right upper lobe lesion and a 1 cm  left upper lobe lesion incidentally as he was being evaluated for knee  surgery by Dr. Ophelia Charter.  A follow-up CT of the chest and PET scan were  positive with an SUV of 5 in the right upper lobe nodule and an SUV of 8.3  in the smaller left upper lobe nodule.  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  The tumor involves the visceral pleura.  No direct tumor  extension.  No vascular invasion.  The five lymph nodes were all negative.  He is T1 N0 MX.  The lymph nodes examined were two level 12, one level 11R,  one level 10R, one level 10R #2.  Maximum tumor size is 2.2 cm.  The patient  will be scheduled for wedge resection of the left upper lobe nodule in three  to four months, currently, he is doing well postoperatively and is expected  to be discharged in a few days.  We were asked to see the patient while in  the hospital with further recommendations.   PAST MEDICAL HISTORY:  1.  Prior colon perforation in 1999.  2.  History of rheumatic fever at age 37.  3.  Osteoarthritis.   PAST SURGICAL HISTORY:  1.  Status post right VATS, right upper lobectomy July 30, 2005.  2.  Status  post perforated colon repair with colostomy 1999.  3.  Status post ventral hernia repair, Dr. Orson Bauer, remote.  4.  Status post left total hip arthroplasty revision May 27, 2005.  5.  Status post right elbow decompression March 23, 2003.  6.  Status post appendectomy 1948.  7.  Status post cholecystectomy 1998.   ALLERGIES:  NKDA.   CURRENT MEDICATIONS:  1.  Proventil nebulizer q.6h.  2.  Vitamin C 500 mg daily.  3.  Dulcolax daily.  4.  Zinacef 1.5 g q.12h.  5.  Claritin daily.  6.  Morphine, Percocet, Benadryl, Darvocet, Dalmane, Ultram, Reglan, and      Narcan p.r.n.   REVIEW OF SYSTEMS:  Essentially unremarkable from the oncology standpoint.  He denies any night sweats or fatigue.  No weight loss or anorexia.  No  vision changes despite chronic disconjugated eyesight.  No productive cough.  No chest pain or  abdominal pain.  No dysuria or hematuria.  No blood in the  stools.  No dysesthesias.   FAMILY HISTORY:  Negative for cancer.  Positive for cardiac disease.   SOCIAL HISTORY:  The patient is widowed.  He has four children.  He is  retired.  He quit tobacco use 20 years ago.  Until then he smoked two packs  a day of cigarettes for at least 20 years.  He drinks alcohol on social  occasions.  He lives in [place].  Last flu vaccine November 2006.   PHYSICAL EXAMINATION:  GENERAL:  This is a well-developed 77 year old white  male in no acute distress.  Alert and oriented x3.  VITAL SIGNS:  Blood pressure 140/60, pulse 99, respirations 20, temperature  99 degrees, pulse oximetry 98% on room air, weight 185 pounds, height 59  inches.  HEENT:  Normocephalic, atraumatic.  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  No thrush.  No lesions.  NECK:  Supple.  No cervical or supraclavicular masses.  LUNGS:  Decreased breath sounds on the right, otherwise with no rhonchi or  rales.  No axillary masses.  CARDIOVASCULAR:  Regular rate and rhythm without murmurs, rubs, or  gallops.  ABDOMEN:  Mildly distended, protuberant, nontender, slightly decreased bowel  sounds x4.  No palpable spleen or liver.  GENITOURINARY:  Deferred.  RECTAL:  Deferred.  EXTREMITIES:  No clubbing or cyanosis.  No edema.  SKIN:  Other than a well healed area over incision status post right upper  lobectomy the rest of the skin is intact with no other lesion or petechial  rashes visible.  NEUROLOGIC:  Nonfocal.   LABORATORIES:  Hemoglobin 12.9, hematocrit 36.6, white count 15, platelets  253, MCV 94.6.  PT 12.9, PTT 30, INR 1.  Sodium 134, potassium 3.4, BUN 6,  creatinine 1, glucose 144, total bilirubin 1.1, alkaline phosphatase 83, AST  37, ALT 20, total protein 5.4, albumin 2.8, calcium 8.3.   RADIOLOGICAL STUDIES:  PET scan shows the left upper lobe small nodule to be  9.9 x 12.7 mm, SUV 8.3, the right upper lobe shows large cavitary lesion  with peripheral soft tissue attentuation with SUV of 5.  Liver and spleen  negative.  Normal uptake within the retroperitoneum or mesentery.  No  inguinal or pelvic masses.  CT of the chest on May 28, 2005 shows a  left upper lobe mass between 10.1 x 11.3 mm nodule, right upper lobe  measuring 3.4 cm.  No lytic or sclerotic lesions.   ASSESSMENT/PLAN:  Dr Arbutus Ped has seen and evaluated the patient and the  chart has been reviewed.  This is a very pleasant 77 year old male with  recently diagnosed stage IB T2 N0 M0 right upper lobe nonsmall cell lung  carcinoma, adenocarcinoma with bronchoalveolar features.  The patient still  has very hypermetabolic left upper lobe nodule, questionable for a second  primary lung cancer versus metastatic focus.  Agree with resection of the  second nodule in three to four months.  Dr. Arbutus Ped may three cycles of  neoadjuvant chemotherapy with platinum based regimen in four weeks after the  current surgery and before the next surgery for the remaining of the left upper lobe nodule.  This may be  helpful in controlling disease, especially  of the second left upper lobe nodule if this were  metastatic.  Dr. Arbutus Ped will arrange a follow-up appointment with him at  the [facility] in two weeks.  We will check a CT  of the brain.  Thank you very much for allowing [place] the opportunity to participate in the  care of Mr. Mcmanaway.      Marlowe Kays, P.A.      Lajuana Matte, MD  Electronically Signed    SW/MEDQ  D:  08/06/2005  T:  08/06/2005  Job:  [id number]   cc:   Lebron Conners, M.D.  [address]  [place]  [place] [address]

## 2011-02-01 NOTE — Discharge Summary (Signed)
NAME:  Kyle Bauer, Kyle Bauer                 ACCOUNT NO.:  [id number]   MEDICAL RECORD NO.:  [id number]          PATIENT TYPE:  INP   LOCATION:  5039                         FACILITY:  [facility]   PHYSICIAN:  Mark C. Ophelia Charter, M.D.    DATE OF BIRTH:  26-Aug-1931   DATE OF ADMISSION:  05/27/2005  DATE OF DISCHARGE:  05/30/2005                                 DISCHARGE SUMMARY   FINAL DIAGNOSIS:  Left total hip arthroplasty, eccentric __poly wear  with  broken polyethylene liner and migration.   ADDITIONAL DIAGNOSES:  1.  Coronary artery disease.  2.  Long-term use of anticoagulants.   HISTORY OF PRESENT ILLNESS:  This 77 year old male had a total hip  arthroplasty 15 years ago and was followed up in the clinic with increased  pain and a broken poly liner with lateral migration.  He is admitted for  poly and ball revision and he did not  have any evidence of acetabular or  femoral loosening.  There was some mild osteolysis along the edges.   HOSPITAL COURSE:  The patient was admitted after routine preoperative labs  and was taken to the operating room and underwent polyethylene and ball  exchange, removing the old worn, broken liner and replacing it with a new  liner.  Postoperatively, he was up, full weightbearing with physical  therapy.  CT scan of his chest was obtained due to chest x-ray showing right  mid lung field cavitary lesion. This showed a new left lung field speculated  lesion and pulmonary consultation was obtained with plan for followup CT  scan versus PET scan versus biopsy.  He was discharged to followup with Dr.  Fannie Knee next week.  He was taking Coumadin as well as Tylox and Coumadin  was for anti-DVT prophylaxis.   CONDITION ON DISCHARGE:  Satisfactory.  X-ray showed reduced hip obtained in  the recovery room.      Mark C. Ophelia Charter, M.D.  Electronically Signed     MCY/MEDQ  D:  08/21/2005  T:  08/22/2005  Job:  [id number]   cc:   Joni Fears D. Maple Hudson, M.D.  [facility]  [address]  [place]  [place] [address]

## 2011-02-01 NOTE — Assessment & Plan Note (Signed)
[facility]                               PULMONARY OFFICE NOTE   JACEION, ADAY                        MRN:          [id number]  DATE:07/31/2006                            DOB:          22-Jan-1931    PULMONARY/ALLERGY FOLLOWUP.   PROBLEMS:  1. Allergic rhinitis.  2. Allergic conjunctivitis.  3. Bilateral lung nodules/adenocarcinomas.   HISTORY:  One-year followup.  Tuberculosis skin test had been negative.  PET  scan had shown nodules in both upper lobes and ultimately Dr. Edwyna Shell  resected these, finding bronchoalveolar adenocarcinomas on the left.  In  addition to resection he had radioactive seeds planted.  He has had more  residual post thoracotomy pain on the left side, but is getting around.  He  feels he needs to lose some weight, but he denies significant cough, any  blood or fever.  He stayed on his allergy shots through all of this and  wishes to continue.  We discussed issues of administration outside of a  medical office, anaphylaxis and epinephrine, and made sure he had a current  epinephrine injector with discussion.   MEDICATIONS:  1. Allegra 180 mg.  2. Allergy injection weekly.  3. EpiPen available p.r.n.   OBJECTIVE:  VITAL SIGNS:  Weight 200 pounds, BP 156/76, pulse 73, room air  saturation 96%.  GENERAL:  He is a little overweight, seems comfortable and alert, and a bit  hard of hearing.  LUNGS:  Lung sounds were surprisingly clear bilaterally, diminished end  expiratory phase slow, but without increased work of breathing or any cough.  LYMPH:  I found no adenopathy at the neck or axillae.  HEART:  Heart sounds were regular without murmur.  EXTREMITIES:  There was no cyanosis, clubbing or edema.   IMPRESSION:  1. Status post bilateral upper lobe video-assisted thoracoscopy resections      of adenocarcinoma nodules and radioactive seed implants on the left.  2. Asthma.  3. Allergic rhinitis.   PLAN:  1. Refill  EpiPen.  2. Discussed asthma management, not requiring intervention currently.  3. Schedule return in 1 year, but earlier p.r.n.     Clinton D. Maple Hudson, MD, Tonny Bollman, FACP  Electronically Signed    CDY/MedQ  DD: 07/31/2006  DT: 08/01/2006  Job #: [id number]   cc:   Windle Guard, M.D.  Lajuana Matte, MD  Ines Bloomer, M.D.

## 2011-02-01 NOTE — Op Note (Signed)
NAME:  Kyle Bauer, Kyle Bauer                           ACCOUNT NO.:  [id number]   MEDICAL RECORD NO.:  [id number]                   PATIENT TYPE:  AMB   LOCATION:  DSC                                  FACILITY:  [facility]   PHYSICIAN:  Lowell Bouton, M.D.      DATE OF BIRTH:  01/27/1931   DATE OF PROCEDURE:  03/23/2003  DATE OF DISCHARGE:                                 OPERATIVE REPORT   PREOPERATIVE DIAGNOSIS:  Ulnar nerve compression, right elbow.   POSTOPERATIVE DIAGNOSIS:  Ulnar nerve compression, right elbow.   PROCEDURE:  Anterior subcutaneous transposition ulnar nerve, right elbow.   SURGEON:  Lowell Bouton, M.D.   ANESTHESIA:  General.   FINDINGS:  The patient had evidence of compression both proximal and distal  to the medial epicondyle.   DESCRIPTION OF PROCEDURE:  Under general anesthesia with a tourniquet on the  right arm the right arm was prepped and draped in the usual fashion. After  exsanguinating the limb the tourniquet was inflated to 250 mmHg.   A curved incision was made just posterior  to the medial epicondyle and the  right elbow. Sharp dissection was carried through the subcutaneous tissues  and bleeding was coagulated with electrocautery. Blunt dissection was  carried through the subcutaneous tissue  down to the ulnar nerve which was  identified  just proximal to the medial epicondyle.   The nerve was then dissected out proximally about 8 cm above  the medial  epicondyle releasing fascia and any constriction over  the nerve. The nerve  was then traced distally beyond the medial epicondyle and care was taken to  protect the branch to the SVU tendon. The nerve was traced down to the  entrance to the fascia and the fascia of the SVU was released.   The nerve was then circumferentially released and a sharp knife was used to  elevate the subcutaneous tissues off the volar aspect of the flexor muscle  mass. The nerve was transposed anterior to  the medial epicondyle and the  elbow was placed through full flexion and extension. The nerve tracked  appropriately without any compression.   The subcutaneous flap was then closed with a 4-0 Vicryl in the fat down to  the medial epicondyle. This allowed a tunneling of the nerve without any  compression anterior to the epicondyle. The wound was irrigated copiously  with saline. Another Vicryl was placed to keep the nerve anterior and then  the subcutaneous tissue was closed over a vessel loop drain with4-0 Vicryl  and the skin was closed with a 3-0 subcuticular Prolene. Steri-Strips were  applied.  Then 0.5% Marcaine was inserted in the skin for pain control. Sterile  dressings were applied and a posterior  elbow splint with the elbow at 90  degrees.   The patient tolerated the procedure well. He went to the recovery room awake  and stable in good condition.  Lowell Bouton, M.D.    EMM/MEDQ  D:  03/23/2003  T:  03/23/2003  Job:  [id number]

## 2011-02-01 NOTE — H&P (Signed)
NAME:  Kyle Bauer, Kyle Bauer                 ACCOUNT NO.:  [id number]   MEDICAL RECORD NO.:  [id number]          PATIENT TYPE:  INP   LOCATION:  NA                           FACILITY:  [facility]   PHYSICIAN:  Ines Bloomer, M.D. DATE OF BIRTH:  Feb 18, 1931   DATE OF ADMISSION:  07/30/2005  DATE OF DISCHARGE:                                HISTORY & PHYSICAL   CHIEF COMPLAINT:  Bilateral lung masses.   This 77 year old patient had a long history of smoking but quit 20 years  ago. He was being evaluated for knee surgery by Dr. Ophelia Charter and was found to  have an abnormal chest x-ray which showed a cavitary right upper lobe lesion  and 1 cm left upper lobe lesion. PET scan was done which was positive both  of them with an issue of 8.3 in the left upper lobe and 5.0 in the right  upper lobe. There was no adenopathy. Pulmonary function tests showed a SVC  of 3.22 and FEV1 of 2.42. He has been previously treated for allergies by  Dr. Jetty Duhamel as well as Dr. __________. He has no hemoptysis, fever,  chills, excessive sputum, and no weight loss.   PAST MEDICAL HISTORY:  He had a perforated colon in 1999 treated with a  colostomy, several operations, and ventral hernia repair by Dr. Lebron Conners. Chest x-rays were negative at that time. No other medical problems.   MEDICATIONS:  Allegra daily.   ALLERGIES:  He has no allergies.   FAMILY HISTORY:  Positive in that his father had cardiac disease but  negative for cancer.   SOCIAL HISTORY:  He is widowed and has four children. Quit smoking 20 years  ago. Does not drink alcohol on a regular basis. He is retired.   REVIEW OF SYSTEMS:  He is 185 pounds and is 5 foot 9. Weight has been  stable. CARDIAC:  He has no angina, atrial fibrillation, or true  arrhythmias. PULMONARY:  As in history of present illness. GI:  No melena,  peptic ulcer disease, reflux, hiatal hernia, dysphagia. See past medical  history. GU:  No kidney disease, dysuria, or kidney  stones. VASCULAR:  No  TIAs, claudication, or DVT. NEUROLOGICAL:  No headaches, blackout, or  seizures. ORTHOPEDIC:  He has chronic arthritis and joint pain. As  mentioned, he is being evaluated by Dr. Ophelia Charter for possible knee surgery.  PSYCHIATRIC:  Denies a history of psychiatric illnesses. ENT:  No changes in  eye sight or hearing. HEMATOLOGICAL:  No anemia.   PHYSICAL EXAMINATION:  GENERAL:  He is a well-developed Caucasian male in no  acute distress.  VITAL SIGNS:  Blood pressure is 152/80, pulse 85, respirations 18.  Saturations is 97%.  HEENT:  Head is atraumatic. Eyes show pupils are equal and reactive to light  and accommodation. Extraocular movements are normal. Ears show tympanic  membranes are intact. Nose shows there is no septal deviation. Throat is  without lesions.  NECK:  Supple with no thyromegaly, supraclavicular, or axillary adenopathy.  No carotid bruits.  CHEST:  Clear to auscultation and  percussion.  HEART:  Regular sinus rhythm. No murmurs.  ABDOMEN:  Multiple surgical scars. Bowel sounds were normal. There is no  hepatosplenomegaly.  EXTREMITIES:  Pulses are 2+. There is no clubbing or edema.  NEUROLOGICAL:  He is oriented x3. Cranial nerves II through XII intact.  Sensory and motor are intact.   IMPRESSION:  1.  Bilateral upper lobe lesions.  2.  History of perforated viscus.   PLAN:  Right VATs, right upper lobectomy, followed by wedge resection in the  left upper lobe as a staged procedure.           ______________________________  Ines Bloomer, M.D.     DPB/MEDQ  D:  07/29/2005  T:  07/29/2005  Job:  [id number]

## 2011-02-01 NOTE — Discharge Summary (Signed)
Kyle Bauer, Kyle Bauer                 ACCOUNT NO.:  [id number]   MEDICAL RECORD NO.:  [id number]          PATIENT TYPE:  INP   LOCATION:  2037                         FACILITY:  [facility]   PHYSICIAN:  Ines Bloomer, M.D. DATE OF BIRTH:  [birth year]   DATE OF ADMISSION:  11/05/2005  DATE OF DISCHARGE:  11/09/2005                                 DISCHARGE SUMMARY   PRIMARY ADMITTING DIAGNOSIS:  Left lung mass.   ADDITIONAL/DISCHARGE DIAGNOSES:  1.  Moderately differentiated invasive adenocarcinoma, (T1 N0 MX)  2.  Allergic rhinitis.  3.  History of perforated colon, status post colonoscopy.  4.  History of right upper lobectomy for stage Ib non-small cell lung      cancer.  5.  Chronic obstructive pulmonary disease.  6.  Postoperative tachycardia.  7.  History of tobacco abuse in the past.   PROCEDURES PERFORMED:  1.  Left VATS.  2.  Left upper lobe wedge resection, with radioactive seed implantation and      lymph node dissection.   HISTORY:  The patient is a 77 year old male who originally had been  evaluated by Dr. Ophelia Charter for knee surgery. On preoperative chest x-ray he was  found to have bilateral lung lesions. A PET scan was then performed, which  was positive on both sides. He subsequently underwent a right upper  lobectomy on July 30, 2005 by Dr. Edwyna Shell, and was found to have  bronchoalveolar carcinoma, stage T2 N0 MX. The left upper lobe lesion had an  SUV on PET scan of 8.3. He has been seen and followed by Dr. Arbutus Ped from  oncology.  A repeat CT scan recently showed no change in the size of the  left upper lobe lung lesion. He returned for follow-up recently with Dr.  Edwyna Shell, and it was felt that he should undergo a VATS wedge resection at  this time. Also, it was felt that he would benefit from radioactive seed  implantation with the assistance of Dr. Kathrynn Running. Dr. Edwyna Shell discussed the  risks, benefits and alternatives of the procedure with the patient, and he  agreed  to proceed with surgery.   HOSPITAL COURSE:  Mr. Goodwyn was admitted to [facility] November 05, 2005. He  was taken to the operating room and underwent a left upper lobe wedge  resection. He tolerated the procedure well and was transferred to the step-  down unit in stable condition. Postoperatively he has done well. His chest  tubes have been removed in a stepwise fashion,  and his follow-up chest x-  rays have remained stable with no pneumothorax. He has been ambulating in  the halls without problem. He is tolerating a regular diet. His surgical  incision sites were all healing well. His only postoperative difficulty has  been mild tachycardia, with heart rate sustained initially in the 100-120's;  sinus tachycardia. His blood pressure was also elevated, with systolic's  running 140-160 and diastolic's of 80-90. He was started on Lopressor 25 mg  b.i.d., and since that time his blood pressure and his heart rate have been  well-controlled. He has  now been transferred to the Floor.   His labs were stable, with hemoglobin of 12.9, hematocrit 35.7, white count  11.6, platelets 206.  Sodium 134, potassium 4.6, BUN 11, creatinine 0.8.   His final pathology was positive for moderately differentiated invasive  adenocarcinoma, with bronchioalveolar features (T1 N0). It is Dr. Scheryl Darter  opinion that if he remained stable over the next 24 hours and no acute  changes occur in the interim, he will be ready for discharge home on  November 09, 2005.   DISCHARGE MEDICATIONS:  1.  Tylox 1-2 q.4 h p.r.n. for pain.  2.  Lopressor 25 mg b.i.d.  3.  Allegra one q.d.  4.  Vitamin C one q.d.   DISCHARGE INSTRUCTIONS:  He is asked to refrain from driving, heavy lifting  or strenuous activity. He may continue to ambulate daily and use his  incentive spirometer. He may shower daily and clean his incisions with soap  and water.   DISCHARGE FOLLOWUP:  He will see Dr. Edwyna Shell back in the office in 1 week   with a chest x-ray. He will follow up as directed with Dr. Kathrynn Running and  Dr. Arbutus Ped. He will call our office in the interim if he experiences any  problems or has questions.      Coral Ceo, P.A.    ______________________________  Ines Bloomer, M.D.    GC/MEDQ  D:  11/08/2005  T:  11/08/2005  Job:  [id number]   cc:   Windle Guard, M.D.  Fax: [phone]   Lajuana Matte, MD  Fax: [phone]   Artist Pais. Kathrynn Running, M.D.  Fax: [phone]

## 2011-02-19 ENCOUNTER — Ambulatory Visit (INDEPENDENT_AMBULATORY_CARE_PROVIDER_SITE_OTHER): Payer: Medicare Other

## 2011-02-19 DIAGNOSIS — J309 Allergic rhinitis, unspecified: Secondary | ICD-10-CM

## 2011-06-11 ENCOUNTER — Ambulatory Visit (INDEPENDENT_AMBULATORY_CARE_PROVIDER_SITE_OTHER): Payer: Medicare Other

## 2011-06-11 DIAGNOSIS — J309 Allergic rhinitis, unspecified: Secondary | ICD-10-CM

## 2011-08-26 ENCOUNTER — Other Ambulatory Visit: Payer: Self-pay | Admitting: Internal Medicine

## 2011-08-26 ENCOUNTER — Ambulatory Visit (HOSPITAL_COMMUNITY)
Admission: RE | Admit: 2011-08-26 | Discharge: 2011-08-26 | Disposition: A | Discharge status: Home / Self Care | Payer: Medicare Other | Source: Ambulatory Visit | Attending: Internal Medicine | Admitting: Internal Medicine

## 2011-08-26 ENCOUNTER — Other Ambulatory Visit (HOSPITAL_BASED_OUTPATIENT_CLINIC_OR_DEPARTMENT_OTHER): Payer: Medicare Other

## 2011-08-26 DIAGNOSIS — Z902 Acquired absence of lung [part of]: Secondary | ICD-10-CM | POA: Insufficient documentation

## 2011-08-26 DIAGNOSIS — K7689 Other specified diseases of liver: Secondary | ICD-10-CM | POA: Insufficient documentation

## 2011-08-26 DIAGNOSIS — R059 Cough, unspecified: Secondary | ICD-10-CM | POA: Insufficient documentation

## 2011-08-26 DIAGNOSIS — R0602 Shortness of breath: Secondary | ICD-10-CM | POA: Insufficient documentation

## 2011-08-26 DIAGNOSIS — C3410 Malignant neoplasm of upper lobe, unspecified bronchus or lung: Secondary | ICD-10-CM

## 2011-08-26 DIAGNOSIS — C3490 Malignant neoplasm of unspecified part of unspecified bronchus or lung: Secondary | ICD-10-CM | POA: Insufficient documentation

## 2011-08-26 DIAGNOSIS — R05 Cough: Secondary | ICD-10-CM | POA: Insufficient documentation

## 2011-08-26 DIAGNOSIS — Z9089 Acquired absence of other organs: Secondary | ICD-10-CM | POA: Insufficient documentation

## 2011-08-26 LAB — CBC WITH DIFFERENTIAL/PLATELET
Basophils Absolute: 0 10*3/uL (ref 0.0–0.1)
Eosinophils Absolute: 0.3 10*3/uL (ref 0.0–0.5)
HGB: 16.1 g/dL (ref 13.0–17.1)
MONO%: 9 % (ref 0.0–14.0)
NEUT#: 9 10*3/uL — ABNORMAL HIGH (ref 1.5–6.5)
RBC: 4.59 10*6/uL (ref 4.20–5.82)
RDW: 12.9 % (ref 11.0–14.6)
WBC: 11.3 10*3/uL — ABNORMAL HIGH (ref 4.0–10.3)
lymph#: 1 10*3/uL (ref 0.9–3.3)

## 2011-08-26 LAB — CMP (CANCER CENTER ONLY)
AST: 31 U/L (ref 11–38)
Albumin: 3.9 g/dL (ref 3.3–5.5)
Alkaline Phosphatase: 87 U/L — ABNORMAL HIGH (ref 26–84)
BUN, Bld: 14 mg/dL (ref 7–22)
Calcium: 9.2 mg/dL (ref 8.0–10.3)
Chloride: 102 mEq/L (ref 98–108)
Glucose, Bld: 109 mg/dL (ref 73–118)
Potassium: 4.3 mEq/L (ref 3.3–4.7)
Sodium: 142 mEq/L (ref 128–145)
Total Protein: 6.9 g/dL (ref 6.4–8.1)

## 2011-08-26 MED ORDER — IOHEXOL 300 MG/ML  SOLN
80.0000 mL | Freq: Once | INTRAMUSCULAR | Status: AC | PRN
  Administered 2011-08-26: 80 mL via INTRAVENOUS

## 2011-08-28 ENCOUNTER — Telehealth: Payer: Self-pay | Admitting: Internal Medicine

## 2011-08-28 ENCOUNTER — Ambulatory Visit (HOSPITAL_BASED_OUTPATIENT_CLINIC_OR_DEPARTMENT_OTHER): Payer: Medicare Other | Admitting: Internal Medicine

## 2011-08-28 DIAGNOSIS — C3490 Malignant neoplasm of unspecified part of unspecified bronchus or lung: Secondary | ICD-10-CM

## 2011-08-28 DIAGNOSIS — C3430 Malignant neoplasm of lower lobe, unspecified bronchus or lung: Secondary | ICD-10-CM

## 2011-08-28 DIAGNOSIS — C3410 Malignant neoplasm of upper lobe, unspecified bronchus or lung: Secondary | ICD-10-CM

## 2011-08-28 NOTE — Progress Notes (Signed)
[facility] OFFICE PROGRESS NOTE  Kaleen Mask, MD [address] [place] [address]  DIAGNOSIS:   1. Stage IB (T2N0M0) right upper lobe non-small cell lung cancer consistent with adenocarcinoma with bronchoalveolar features, diagnosed in November 2006. 2. 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  PRIOR THERAPY:  1. Status post right upper lobectomy in November 2006. 2. Status post left upper lobe wedge resection with seed implants on November 07, 2005, under the care of Dr Edwyna Shell.  CURRENT THERAPY: Observation.  INTERVAL HISTORY: Kyle Bauer 77 y.o. male returns to the clinic today for annual followup visit. The patient is feeling fine today and he denied having any specific complaints. He has no chest pain, no shortness breath, no cough, no hemoptysis, no nausea or vomiting, no weight loss or night sweats. He has repeat CT scan of the chest performed on 08/17/2011 and he is here for evaluation and discussion of his scan results.    ALLERGIES:  NKDA  MEDICATIONS:  No current outpatient prescriptions on file.    REVIEW OF SYSTEMS:  A comprehensive review of systems was negative.   PHYSICAL EXAMINATION: General appearance: alert, cooperative and no distress Head: Normocephalic, without obvious abnormality, atraumatic Neck: no adenopathy Lymph nodes: Cervical, supraclavicular, and axillary nodes normal. Resp: clear to auscultation bilaterally Cardio: regular rate and rhythm, S1, S2 normal, no murmur, click, rub or gallop GI: soft, non-tender; bowel sounds normal; no masses,  no organomegaly Extremities: extremities normal, atraumatic, no cyanosis or edema Neurologic: Alert and oriented X 3, normal strength and tone. Normal symmetric reflexes. Normal coordination and gait  ECOG PERFORMANCE STATUS: 1 -  Symptomatic but completely ambulatory  Blood pressure 147/80, pulse 82, temperature 97 F (36.1 C), temperature source Oral, height 5\' 10"  (1.778 m), weight 201 lb (91.173 kg).  LABORATORY DATA: Lab Results  Component Value Date   WBC 11.3* 08/26/2011   HGB 16.1 08/26/2011   HCT 45.0 08/26/2011   MCV 98.1* 08/26/2011   PLT 229 08/26/2011      Chemistry      Component Value Date/Time   NA 142 08/26/2011 1108   NA 139 08/23/2010 1029   K 4.3 08/26/2011 1108   K 4.3 08/23/2010 1029   CL 102 08/26/2011 1108   CL 102 08/23/2010 1029   CO2 27 08/26/2011 1108   CO2 29 08/23/2010 1029   BUN 14 08/26/2011 1108   BUN 12 08/23/2010 1029   CREATININE 0.9 08/26/2011 1108   CREATININE 1.07 08/23/2010 1029      Component Value Date/Time   CALCIUM 9.2 08/26/2011 1108   CALCIUM 9.3 08/23/2010 1029   ALKPHOS 87* 08/26/2011 1108   ALKPHOS 83 08/23/2010 1029   AST 31 08/26/2011 1108   AST 32 08/23/2010 1029   ALT 26 08/23/2010 1029   BILITOT 1.20 08/26/2011 1108   BILITOT 0.9 08/23/2010 1029       RADIOGRAPHIC STUDIES: Ct Chest W Contrast  08/26/2011  *RADIOLOGY REPORT*  Clinical Data: Lung cancer status post right upper lobectomy 2006 and left brachytherapy 2007, restaging, chronic cough and shortness of breath  CT CHEST WITH CONTRAST  Technique:  Multidetector CT imaging of the chest was performed following the standard protocol during bolus administration of intravenous contrast.  Contrast: 80mL OMNIPAQUE IOHEXOL 300 MG/ML IV SOLN  Comparison: 08/23/2010  Findings: Evidence of right upper lobectomy again noted without evidence for mass  lesion at the resection margin.  Left upper lobe brachytherapy clips noted.  Heart size is normal.  No pericardial or pleural effusion.  Stable right perihilar 0.8 cm lymph node with punctate internal calcification image 34.  No new lymphadenopathy.  Incomplete imaging of the upper abdomen demonstrates normal- appearing adrenal glands. Cholecystectomy clips noted.   Hepatic steatosis again noted.  Atherosclerotic aortic calcification noted without aneurysm.  Patchy nodular areas of ground-glass airspace opacity in the right lower lobe are stable.  No new nodule or mass lesion is identified. No acute osseous abnormality or lytic/sclerotic osseous lesion. Probable hemangiomata at the T6 and T8 level as evidenced by coarsened trabecular markings.  Focal Paget's disease less likely.  IMPRESSION: Stable post-treatment findings as above without CT evidence for intrathoracic recurrence/local metastasis.  Hepatic steatosis.  Original Report Authenticated By: Harrel Lemon, M.D.    ASSESSMENT: This is a very pleasant 77 years old white male with bilateral non-small cell lung cancer status post surgical resection. The patient has been observation since February 2007 with no evidence for disease recurrence. I discussed the scan results with the patient.  PLAN: I recommended for him continuous observation for now. I was him back for followup visit in one year with repeat CT scan of the chest. The patient was advised to call me immediately she has any concerning symptoms in the interval.   All questions were answered. The patient knows to call the clinic with any problems, questions or concerns. We can certainly see the patient much sooner if necessary.

## 2011-08-28 NOTE — Telephone Encounter (Signed)
gve the pt his dec 2013 appt calendar along with the ct scan appt

## 2011-10-16 ENCOUNTER — Ambulatory Visit (INDEPENDENT_AMBULATORY_CARE_PROVIDER_SITE_OTHER): Payer: Medicare Other

## 2011-10-16 DIAGNOSIS — J309 Allergic rhinitis, unspecified: Secondary | ICD-10-CM

## 2011-11-08 ENCOUNTER — Encounter: Payer: Self-pay | Admitting: Internal Medicine

## 2011-11-11 ENCOUNTER — Encounter: Payer: Self-pay | Admitting: Internal Medicine

## 2011-11-11 ENCOUNTER — Ambulatory Visit (INDEPENDENT_AMBULATORY_CARE_PROVIDER_SITE_OTHER): Payer: Medicare Other | Admitting: Internal Medicine

## 2011-11-11 VITALS — BP 130/72 | HR 76 | Ht 69.5 in | Wt 203.0 lb

## 2011-11-11 DIAGNOSIS — J309 Allergic rhinitis, unspecified: Secondary | ICD-10-CM

## 2011-11-11 DIAGNOSIS — J449 Chronic obstructive pulmonary disease, unspecified: Secondary | ICD-10-CM

## 2011-11-11 MED ORDER — EPINEPHRINE 0.3 MG/0.3ML IJ DEVI: 0.3000 mg | Freq: Once | INTRAMUSCULAR | Status: AC

## 2011-11-11 NOTE — Progress Notes (Signed)
11/11/11- 80 yoM former smoker followed for COPD, allergic rhinitis, complicated by hx bilateral upper lobe adenoca lung LOV- 11/14/2010 IMPRESSION: CT 08/26/11-images reviewed with him by CDY Stable post-treatment findings as above without CT evidence for  intrathoracic recurrence/local metastasis.  Hepatic steatosis.  Original Report Authenticated By: Harrel Lemon, M.D.  He considers allergy vaccine okay and helpful. He is also taking Allegra. Continues annual cancer Center followup with no recurrence. Says his breathing is good for now. In December he had a bad cold with productive cough which is resolved. Never needs his rescue inhaler.  ROS-see HPI Constitutional:   No-   weight loss, night sweats, fevers, chills, fatigue, lassitude. HEENT:   No-  headaches, difficulty swallowing, tooth/dental problems, sore throat,       No-  sneezing, itching, ear ache, nasal congestion, post nasal drip,  CV:  No-   chest pain, orthopnea, PND, swelling in lower extremities, anasarca,  dizziness, palpitations Resp: No-   shortness of breath with exertion or at rest.              No-   productive cough,  No non-productive cough,  No- coughing up of blood.              No-   change in color of mucus.  No- wheezing.   Skin: No-   rash or lesions. GI:  No-   heartburn, indigestion, abdominal pain, nausea, vomiting, diarrhea,                 change in bowel habits, loss of appetite GU: MS:  No-   joint pain or swelling.  No- decreased range of motion.  No- back pain. Neuro-     nothing unusual Psych:  No- change in mood or affect. No depression or anxiety.  No memory loss.  OBJ- Physical Exam General- Alert, Oriented, Affect-appropriate, Distress- none acute Skin- rash-none, lesions- none, excoriation- none Lymphadenopathy- none Head- atraumatic            Eyes- Gross vision intact, PERRLA, conjunctivae and secretions clear, right eye drifts up            Ears- Hearing, canals-normal  Nose- Clear, no-Septal dev, mucus, polyps, erosion, perforation             Throat- Mallampati III , mucosa clear , drainage- none, tonsils- atrophic Neck- flexible , trachea midline, no stridor , thyroid nl, carotid no bruit Chest - symmetrical excursion , unlabored           Heart/CV- RRR , no murmur , no gallop  , no rub, nl s1 s2                           - JVD- none , edema- none, stasis changes- none, varices- none           Lung- clear to P&A, wheeze- none, cough- none , dullness-none, rub- none           Chest wall-  Abd- Br/ Gen/ Rectal- Not done, not indicated Extrem- cyanosis- none, clubbing, none, atrophy- none, strength- nl Neuro- grossly intact to observation

## 2011-11-11 NOTE — Patient Instructions (Signed)
Please call as needed  Continue allergy vaccine, and use allegra and saline nasal rinse as needed

## 2011-11-15 NOTE — Assessment & Plan Note (Signed)
Controlled with treatment.

## 2011-11-15 NOTE — Assessment & Plan Note (Signed)
He is satisfied that allergy vaccine is working for him.

## 2012-01-22 ENCOUNTER — Ambulatory Visit (INDEPENDENT_AMBULATORY_CARE_PROVIDER_SITE_OTHER): Payer: Medicare Other

## 2012-01-22 DIAGNOSIS — J309 Allergic rhinitis, unspecified: Secondary | ICD-10-CM

## 2012-05-04 ENCOUNTER — Ambulatory Visit (INDEPENDENT_AMBULATORY_CARE_PROVIDER_SITE_OTHER): Payer: Medicare Other

## 2012-05-04 DIAGNOSIS — J309 Allergic rhinitis, unspecified: Secondary | ICD-10-CM

## 2012-08-24 ENCOUNTER — Other Ambulatory Visit (HOSPITAL_BASED_OUTPATIENT_CLINIC_OR_DEPARTMENT_OTHER): Payer: Medicare Other | Admitting: Lab

## 2012-08-24 ENCOUNTER — Ambulatory Visit (HOSPITAL_COMMUNITY)
Admission: RE | Admit: 2012-08-24 | Discharge: 2012-08-24 | Disposition: A | Discharge status: Home / Self Care | Payer: Medicare Other | Source: Ambulatory Visit | Attending: Internal Medicine | Admitting: Internal Medicine

## 2012-08-24 DIAGNOSIS — C3410 Malignant neoplasm of upper lobe, unspecified bronchus or lung: Secondary | ICD-10-CM

## 2012-08-24 DIAGNOSIS — C3430 Malignant neoplasm of lower lobe, unspecified bronchus or lung: Secondary | ICD-10-CM

## 2012-08-24 DIAGNOSIS — C3490 Malignant neoplasm of unspecified part of unspecified bronchus or lung: Secondary | ICD-10-CM

## 2012-08-24 DIAGNOSIS — K7689 Other specified diseases of liver: Secondary | ICD-10-CM | POA: Insufficient documentation

## 2012-08-24 LAB — CBC WITH DIFFERENTIAL/PLATELET
Basophils Absolute: 0.1 10*3/uL (ref 0.0–0.1)
Eosinophils Absolute: 0.3 10*3/uL (ref 0.0–0.5)
LYMPH%: 9.4 % — ABNORMAL LOW (ref 14.0–49.0)
MCV: 97.6 fL (ref 79.3–98.0)
MONO%: 9.7 % (ref 0.0–14.0)
NEUT#: 9.3 10*3/uL — ABNORMAL HIGH (ref 1.5–6.5)
NEUT%: 77.8 % — ABNORMAL HIGH (ref 39.0–75.0)
Platelets: 259 10*3/uL (ref 140–400)
RBC: 4.8 10*6/uL (ref 4.20–5.82)

## 2012-08-24 LAB — COMPREHENSIVE METABOLIC PANEL (CC13)
AST: 27 U/L (ref 5–34)
Albumin: 3.7 g/dL (ref 3.5–5.0)
Alkaline Phosphatase: 116 U/L (ref 40–150)
BUN: 12 mg/dL (ref 7.0–26.0)
Calcium: 9.9 mg/dL (ref 8.4–10.4)
Chloride: 101 mEq/L (ref 98–107)
Glucose: 118 mg/dl — ABNORMAL HIGH (ref 70–99)
Potassium: 4.3 mEq/L (ref 3.5–5.1)
Sodium: 139 mEq/L (ref 136–145)
Total Protein: 7.3 g/dL (ref 6.4–8.3)

## 2012-08-25 ENCOUNTER — Ambulatory Visit (INDEPENDENT_AMBULATORY_CARE_PROVIDER_SITE_OTHER): Payer: Medicare Other

## 2012-08-25 DIAGNOSIS — J309 Allergic rhinitis, unspecified: Secondary | ICD-10-CM

## 2012-08-27 ENCOUNTER — Ambulatory Visit (HOSPITAL_BASED_OUTPATIENT_CLINIC_OR_DEPARTMENT_OTHER): Payer: Medicare Other | Admitting: Internal Medicine

## 2012-08-27 ENCOUNTER — Telehealth: Payer: Self-pay | Admitting: Internal Medicine

## 2012-08-27 VITALS — BP 149/83 | HR 75 | Temp 96.9°F | Resp 20 | Ht 69.5 in | Wt 202.6 lb

## 2012-08-27 DIAGNOSIS — Z85118 Personal history of other malignant neoplasm of bronchus and lung: Secondary | ICD-10-CM

## 2012-08-27 DIAGNOSIS — C3410 Malignant neoplasm of upper lobe, unspecified bronchus or lung: Secondary | ICD-10-CM

## 2012-08-27 NOTE — Patient Instructions (Signed)
Your scan showed no evidence for disease recurrence. Followup in one year with repeat CT scan of the chest

## 2012-08-27 NOTE — Progress Notes (Signed)
[facility] [phone]   Fax:([phone]) [phone]  OFFICE PROGRESS NOTE  Kaleen Mask, MD [address] [place] [address]  DIAGNOSIS:  1. Stage IB (T2N0M0) right upper lobe non-small cell lung cancer consistent with adenocarcinoma with bronchoalveolar features, diagnosed in November 2006. 2. 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  PRIOR THERAPY:  1. Status post right upper lobectomy in November 2006. 2. Status post left upper lobe wedge resection with seed implants on November 07, 2005, under the care of Dr Edwyna Shell.  CURRENT THERAPY: Observation.  INTERVAL HISTORY: Kyle Bauer 78 y.o. male returns to the clinic today for annual followup visit. The patient is doing fine with no specific complaints today except for mild shortness of breath with exertion. He denied having any significant chest pain, cough or hemoptysis. He denied having any significant weight loss or night sweats. The patient had repeat CT scan of the chest performed recently and he is here for evaluation and discussion of his scan results.  MEDICAL HISTORY: Past Medical History  Diagnosis Date  . Rheumatic fever age 78  . Allergic rhinitis   . Strabismus     ALLERGIES:   has no allergies on file.  MEDICATIONS:  Current Outpatient Prescriptions  Medication Sig Dispense Refill  . Azelastine HCl 0.15 % SOLN Place 1 puff into the nose 2 (two) times daily as needed.      . fexofenadine (ALLEGRA) 180 MG tablet Take 180 mg by mouth daily.      Marland Kitchen ibuprofen (ADVIL,MOTRIN) 200 MG tablet Take 200 mg by mouth every 6 (six) hours as needed.      . NON FORMULARY Allergy Vaccine 1:10 GO (w-e)      . EPINEPHrine (EPIPEN) 0.3 mg/0.3 mL DEVI Inject 0.3 mLs (0.3 mg total) into the muscle once. For severe allergic reaction  1 Device  prn     SURGICAL HISTORY:  Past Surgical History  Procedure Date  . Tonsillectomy   . Cholecystectomy   . Right elbow surgery   . Appendectomy   . Total hip arthroplasty     Left  . Colon resection x 2     for perforated colon-colostomy x 6 months  . Lobectomy     RUL-for adenoca T2,N0,MX  . Lul wedge resection     for adenoca with radioactive seeds  . Bilatteral vat's lung resections   . Nasal fracture surgery   . Right arm     REVIEW OF SYSTEMS:  A comprehensive review of systems was negative except for: Respiratory: positive for dyspnea on exertion   PHYSICAL EXAMINATION: General appearance: alert, cooperative and no distress Head: Normocephalic, without obvious abnormality, atraumatic Neck: no adenopathy Resp: clear to auscultation bilaterally Cardio: regular rate and rhythm, S1, S2 normal, no murmur, click, rub or gallop GI: soft, non-tender; bowel sounds normal; no masses,  no organomegaly Extremities: extremities normal, atraumatic, no cyanosis or edema  ECOG PERFORMANCE STATUS: 1 - Symptomatic but completely ambulatory  Blood pressure 149/83, pulse 75, temperature 96.9 F (36.1 C), temperature source Oral, resp. rate 20, height 5' 9.5" (1.765 m), weight 202 lb 9.6 oz (91.899 kg).  LABORATORY DATA: Lab Results  Component Value Date   WBC 12.0* 08/24/2012   HGB 16.3 08/24/2012   HCT 46.8 08/24/2012   MCV 97.6 08/24/2012   PLT 259 08/24/2012      Chemistry  Component Value Date/Time   NA 139 08/24/2012 0954   NA 142 08/26/2011 1108   NA 139 08/23/2010 1029   K 4.3 08/24/2012 0954   K 4.3 08/26/2011 1108   K 4.3 08/23/2010 1029   CL 101 08/24/2012 0954   CL 102 08/26/2011 1108   CL 102 08/23/2010 1029   CO2 28 08/24/2012 0954   CO2 27 08/26/2011 1108   CO2 29 08/23/2010 1029   BUN 12.0 08/24/2012 0954   BUN 14 08/26/2011 1108   BUN 12 08/23/2010 1029   CREATININE 1.0 08/24/2012 0954   CREATININE 0.9 08/26/2011 1108   CREATININE 1.07 08/23/2010 1029       Component Value Date/Time   CALCIUM 9.9 08/24/2012 0954   CALCIUM 9.2 08/26/2011 1108   CALCIUM 9.3 08/23/2010 1029   ALKPHOS 116 08/24/2012 0954   ALKPHOS 87* 08/26/2011 1108   ALKPHOS 83 08/23/2010 1029   AST 27 08/24/2012 0954   AST 31 08/26/2011 1108   AST 32 08/23/2010 1029   ALT 25 08/24/2012 0954   ALT 26 08/23/2010 1029   BILITOT 1.14 08/24/2012 0954   BILITOT 1.20 08/26/2011 1108   BILITOT 0.9 08/23/2010 1029       RADIOGRAPHIC STUDIES: Ct Chest Wo Contrast  08/24/2012  *RADIOLOGY REPORT*  Clinical Data: History of lung cancer.  Restaging scan.  CT CHEST WITHOUT CONTRAST  Technique:  Multidetector CT imaging of the chest was performed following the standard protocol without IV contrast.  Comparison: Chest CT 08/26/2011.  Findings:  Mediastinum: Heart size is normal. There is no significant pericardial fluid, thickening or pericardial calcification. There is atherosclerosis of the thoracic aorta, the great vessels of the mediastinum and the coronary arteries, including calcified atherosclerotic plaque in the left anterior descending and right coronary arteries. Mild dilatation of the pulmonic trunk (3.9 cm in diameter). No pathologically enlarged mediastinal or hilar lymph nodes. Please note that accurate exclusion of hilar adenopathy is limited on noncontrast CT scans.  Lungs/Pleura: Status post right upper lobectomy.  Status post brachytherapy bead placement in the apex of the left hemithorax anteriorly.  There are a few scattered peripheral pulmonary nodules in the lateral aspect of the right middle lobe measuring 2-3 mm, best demonstrated on image 30 of series 7 nonspecific.  No other larger more suspicious appearing pulmonary nodules or masses are otherwise identified.  No acute consolidative airspace disease. Small amount of pleural thickening posteriorly in the right hemithorax is unchanged.  Upper Abdomen: Status post cholecystectomy.  Diffuse decreased attenuation is compatible with  hepatic steatosis.  Atherosclerosis.  Musculoskeletal: There are no aggressive appearing lytic or blastic lesions noted in the visualized portions of the skeleton.  IMPRESSION: 1.  Postoperative and post therapeutic changes as above, without evidence to suggest local recurrence of disease or new metastatic disease in the thorax. 2.  There is a small cluster of 2-3 mm nodules in the periphery of the right middle lobe which are strongly favored to be related to benign disease (likely mucoid impaction within terminal bronchioles).  Attention on the 1-year follow-up CT scan may be useful to confirm their stability or resolution. 3.  Dilatation of the pulmonic trunk (3.9 cm in diameter), which may suggest pulmonary arterial hypertension. 4.  Hepatic steatosis. 5.  Status post cholecystectomy.   Original Report Authenticated By: Trudie Reed, M.D.     ASSESSMENT: This is a very pleasant 78 years old white male with history of stage IB non-small cell lung cancer diagnosed in November of  2006 as well as a stage IA non-small cell lung cancer diagnosed in February of 2007 status post right upper lobectomy followed by left upper lobe wedge resection. The patient has been observation since February of 2007 was no evidence for disease recurrence.  PLAN: I discussed the scan results with the patient today. I recommended for him to continue on observation for now with repeat CT scan of the chest without contrast in one year.  He was advised to call me immediately if he has any concerning symptoms in the interval.  All questions were answered. The patient knows to call the clinic with any problems, questions or concerns. We can certainly see the patient much sooner if necessary.

## 2012-08-27 NOTE — Telephone Encounter (Signed)
appts made and printed for pt pt aware that he will get a letter for his scan     Kyle Bauer

## 2012-11-10 ENCOUNTER — Encounter: Payer: Self-pay | Admitting: Internal Medicine

## 2012-11-10 ENCOUNTER — Ambulatory Visit (INDEPENDENT_AMBULATORY_CARE_PROVIDER_SITE_OTHER): Payer: Medicare Other | Admitting: Internal Medicine

## 2012-11-10 VITALS — BP 134/80 | HR 86 | Ht 69.5 in | Wt 203.8 lb

## 2012-11-10 DIAGNOSIS — J3089 Other allergic rhinitis: Secondary | ICD-10-CM

## 2012-11-10 DIAGNOSIS — J449 Chronic obstructive pulmonary disease, unspecified: Secondary | ICD-10-CM

## 2012-11-10 DIAGNOSIS — J309 Allergic rhinitis, unspecified: Secondary | ICD-10-CM

## 2012-11-10 MED ORDER — AZELASTINE-FLUTICASONE 137-50 MCG/ACT NA SUSP: 1.0000 | Freq: Every day | NASAL | Status: DC

## 2012-11-10 NOTE — Patient Instructions (Addendum)
Sample Dymista nasal spray   1-2 puffs each nostril once daily at bedtime  We can continue allergy vaccine 1:10 GO  Please call as needed

## 2012-11-10 NOTE — Progress Notes (Signed)
11/11/11- 80 yoM former smoker followed for COPD, allergic rhinitis, complicated by hx bilateral upper lobe adenoca lung LOV- 11/14/2010 IMPRESSION: CT 08/26/11-images reviewed with him by CDY Stable post-treatment findings as above without CT evidence for  intrathoracic recurrence/local metastasis.  Hepatic steatosis.  Original Report Authenticated By: Harrel Lemon, M.D.  He considers allergy vaccine okay and helpful. He is also taking Allegra. Continues annual cancer Center followup with no recurrence. Says his breathing is good for now. In December he had a bad cold with productive cough which is resolved. Never needs his rescue inhaler.  11/10/12- 81 yoM former smoker followed for COPD, allergic rhinitis, complicated by hx bilateral upper lobe adenoca lung FOLLOWS FOR: still on allergy vaccine 1:10 GO; couple weeks ago felt full in head.  Increased nasal congestion after cleaning his home with a lot of house dust exposure yesterday. He continues to feel allergy vaccine helps him but notices more postnasal drainage. CT chest 08/24/12 IMPRESSION:  1. Postoperative and post therapeutic changes as above, without  evidence to suggest local recurrence of disease or new metastatic  disease in the thorax.  2. There is a small cluster of 2-3 mm nodules in the periphery of  the right middle lobe which are strongly favored to be related to  benign disease (likely mucoid impaction within terminal  bronchioles). Attention on the 1-year follow-up CT scan may be  useful to confirm their stability or resolution.  3. Dilatation of the pulmonic trunk (3.9 cm in diameter), which  may suggest pulmonary arterial hypertension.  4. Hepatic steatosis.  5. Status post cholecystectomy.  Original Report Authenticated By: Trudie Reed, M.D.   ROS-see HPI Constitutional:   No-   weight loss, night sweats, fevers, chills, fatigue, lassitude. HEENT:   No-  headaches, difficulty swallowing, tooth/dental  problems, sore throat,       No-  sneezing, itching, ear ache, +nasal congestion, +post nasal drip,  CV:  No-   chest pain, orthopnea, PND, swelling in lower extremities, anasarca,  dizziness, palpitations Resp: No-   shortness of breath with exertion or at rest.              No-   productive cough,  No non-productive cough,  No- coughing up of blood.              No-   change in color of mucus.  No- wheezing.   Skin: No-   rash or lesions. GI:  No-   heartburn, indigestion, abdominal pain, nausea, vomiting,  GU: MS:  No-   joint pain or swelling.   Neuro-     nothing unusual Psych:  No- change in mood or affect. No depression or anxiety.  No memory loss.  OBJ- Physical Exam General- Alert, Oriented, Affect-appropriate, Distress- none acute Skin- rash-none, lesions- none, excoriation- none Lymphadenopathy- none Head- atraumatic            Eyes- Gross vision intact, PERRLA, conjunctivae and secretions clear, right eye drifts up( old blow-out fx)            Ears- Hearing, canals-normal            Nose- +turbinate edema, no-Septal dev, mucus, polyps, erosion, perforation             Throat- Mallampati III , mucosa clear , drainage- none, tonsils- atrophic Neck- flexible , trachea midline, no stridor , thyroid nl, carotid no bruit Chest - symmetrical excursion , unlabored  Heart/CV- RRR , no murmur , no gallop  , no rub, nl s1 s2                           - JVD- none , edema- none, stasis changes- none, varices- none           Lung- clear to P&A, wheeze- none, cough- none , dullness-none, rub- none           Chest wall-  Abd- Br/ Gen/ Rectal- Not done, not indicated Extrem- cyanosis- none, clubbing, none, atrophy- none, strength- nl Neuro- grossly intact to observation

## 2012-11-12 NOTE — Assessment & Plan Note (Signed)
Good control without recent exacerbation.

## 2012-11-12 NOTE — Assessment & Plan Note (Signed)
He can continue allergy vaccine 1:10 GO. Plan-try adding sample Dymista nasal spray as discussed.

## 2012-12-22 ENCOUNTER — Ambulatory Visit (INDEPENDENT_AMBULATORY_CARE_PROVIDER_SITE_OTHER): Payer: Medicare Other

## 2012-12-22 DIAGNOSIS — J309 Allergic rhinitis, unspecified: Secondary | ICD-10-CM

## 2013-08-18 ENCOUNTER — Ambulatory Visit (INDEPENDENT_AMBULATORY_CARE_PROVIDER_SITE_OTHER): Payer: Medicare Other

## 2013-08-18 DIAGNOSIS — J309 Allergic rhinitis, unspecified: Secondary | ICD-10-CM

## 2013-08-23 ENCOUNTER — Other Ambulatory Visit (HOSPITAL_BASED_OUTPATIENT_CLINIC_OR_DEPARTMENT_OTHER): Payer: Medicare Other | Admitting: Lab

## 2013-08-23 ENCOUNTER — Ambulatory Visit (HOSPITAL_COMMUNITY)
Admission: RE | Admit: 2013-08-23 | Discharge: 2013-08-23 | Disposition: A | Discharge status: Home / Self Care | Payer: Medicare Other | Source: Ambulatory Visit | Attending: Internal Medicine | Admitting: Internal Medicine

## 2013-08-23 DIAGNOSIS — C3410 Malignant neoplasm of upper lobe, unspecified bronchus or lung: Secondary | ICD-10-CM

## 2013-08-23 DIAGNOSIS — C3490 Malignant neoplasm of unspecified part of unspecified bronchus or lung: Secondary | ICD-10-CM | POA: Insufficient documentation

## 2013-08-23 LAB — CBC WITH DIFFERENTIAL/PLATELET
BASO%: 0.5 % (ref 0.0–2.0)
EOS%: 4 % (ref 0.0–7.0)
HGB: 16.2 g/dL (ref 13.0–17.1)
MCH: 35 pg — ABNORMAL HIGH (ref 27.2–33.4)
MCV: 98.6 fL — ABNORMAL HIGH (ref 79.3–98.0)
MONO%: 8.3 % (ref 0.0–14.0)
RBC: 4.62 10*6/uL (ref 4.20–5.82)
RDW: 13.4 % (ref 11.0–14.6)
lymph#: 1.1 10*3/uL (ref 0.9–3.3)

## 2013-08-23 LAB — COMPREHENSIVE METABOLIC PANEL (CC13)
ALT: 23 U/L (ref 0–55)
AST: 29 U/L (ref 5–34)
Albumin: 3.7 g/dL (ref 3.5–5.0)
Alkaline Phosphatase: 91 U/L (ref 40–150)
Calcium: 9.8 mg/dL (ref 8.4–10.4)
Chloride: 105 mEq/L (ref 98–109)
Potassium: 4.2 mEq/L (ref 3.5–5.1)
Sodium: 139 mEq/L (ref 136–145)
Total Protein: 6.9 g/dL (ref 6.4–8.3)

## 2013-08-24 ENCOUNTER — Other Ambulatory Visit (HOSPITAL_COMMUNITY): Payer: Medicare Other

## 2013-08-26 ENCOUNTER — Telehealth: Payer: Self-pay | Admitting: Internal Medicine

## 2013-08-26 ENCOUNTER — Encounter: Payer: Self-pay | Admitting: Internal Medicine

## 2013-08-26 ENCOUNTER — Ambulatory Visit (HOSPITAL_BASED_OUTPATIENT_CLINIC_OR_DEPARTMENT_OTHER): Payer: Medicare Other | Admitting: Internal Medicine

## 2013-08-26 VITALS — BP 159/83 | HR 66 | Temp 97.6°F | Resp 18 | Ht 69.0 in | Wt 205.6 lb

## 2013-08-26 DIAGNOSIS — Z85118 Personal history of other malignant neoplasm of bronchus and lung: Secondary | ICD-10-CM

## 2013-08-26 DIAGNOSIS — C3490 Malignant neoplasm of unspecified part of unspecified bronchus or lung: Secondary | ICD-10-CM

## 2013-08-26 NOTE — Telephone Encounter (Signed)
Gave pt appt for lab and Md on March 2015 °

## 2013-08-26 NOTE — Progress Notes (Signed)
[facility] Telephone:(336) [phone]   Fax:([phone]  OFFICE PROGRESS NOTE  Kaleen Mask, MD [address] [place] [address]  DIAGNOSIS:  1. Stage IB (T2N0M0) right upper lobe non-small cell lung cancer consistent with adenocarcinoma with bronchoalveolar features, diagnosed in November 2006. 2. 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  PRIOR THERAPY:  1. Status post right upper lobectomy in November 2006. 2. Status post left upper lobe wedge resection with seed implants on November 07, 2005, under the care of Dr Edwyna Shell.  CURRENT THERAPY: Observation.  INTERVAL HISTORY: Kyle Bauer 79 y.o. male returns to the clinic today for annual followup visit. The patient is doing fine with no specific complaints today except for mild shortness of breath with exertion. He denied having any significant chest pain, cough or hemoptysis. He denied having any significant weight loss or night sweats. The patient had repeat CT scan of the chest performed recently and he is here for evaluation and discussion of his scan results.  MEDICAL HISTORY: Past Medical History  Diagnosis Date  . Rheumatic fever age 25  . Allergic rhinitis   . Strabismus     ALLERGIES:  has No Known Allergies.  MEDICATIONS:  Current Outpatient Prescriptions  Medication Sig Dispense Refill  . fexofenadine (ALLEGRA) 180 MG tablet Take 180 mg by mouth daily.      Marland Kitchen ibuprofen (ADVIL,MOTRIN) 200 MG tablet Take 200 mg by mouth every 6 (six) hours as needed.      . NON FORMULARY Allergy Vaccine 1:10 GO (w-e)       No current facility-administered medications for this visit.    SURGICAL HISTORY:  Past Surgical History  Procedure Laterality Date  . Tonsillectomy    . Cholecystectomy    . Right elbow surgery    . Appendectomy    . Total  hip arthroplasty      Left  . Colon resection x 2      for perforated colon-colostomy x 6 months  . Lobectomy      RUL-for adenoca T2,N0,MX  . Lul wedge resection      for adenoca with radioactive seeds  . Bilatteral vat's lung resections    . Nasal fracture surgery    . Right arm      REVIEW OF SYSTEMS:  A comprehensive review of systems was negative except for: Respiratory: positive for dyspnea on exertion   PHYSICAL EXAMINATION: General appearance: alert, cooperative and no distress Head: Normocephalic, without obvious abnormality, atraumatic Neck: no adenopathy Resp: clear to auscultation bilaterally Cardio: regular rate and rhythm, S1, S2 normal, no murmur, click, rub or gallop GI: soft, non-tender; bowel sounds normal; no masses,  no organomegaly Extremities: extremities normal, atraumatic, no cyanosis or edema  ECOG PERFORMANCE STATUS: 1 - Symptomatic but completely ambulatory  Blood pressure 159/83, pulse 66, temperature 97.6 F (36.4 C), temperature source Oral, resp. rate 18, height 5\' 9"  (1.753 m), weight 205 lb 9.6 oz (93.26 kg), SpO2 96.00%.  LABORATORY DATA: Lab Results  Component Value Date   WBC 12.1* 08/23/2013   HGB 16.2 08/23/2013   HCT 45.5 08/23/2013   MCV 98.6* 08/23/2013   PLT 231 08/23/2013      Chemistry      Component Value Date/Time   NA 139 08/23/2013 0923   NA 142 08/26/2011 1108   NA 139 08/23/2010 1029   K  4.2 08/23/2013 0923   K 4.3 08/26/2011 1108   K 4.3 08/23/2010 1029   CL 101 08/24/2012 0954   CL 102 08/26/2011 1108   CL 102 08/23/2010 1029   CO2 25 08/23/2013 0923   CO2 27 08/26/2011 1108   CO2 29 08/23/2010 1029   BUN 12.0 08/23/2013 0923   BUN 14 08/26/2011 1108   BUN 12 08/23/2010 1029   CREATININE 0.9 08/23/2013 0923   CREATININE 0.9 08/26/2011 1108   CREATININE 1.07 08/23/2010 1029      Component Value Date/Time   CALCIUM 9.8 08/23/2013 0923   CALCIUM 9.2 08/26/2011 1108   CALCIUM 9.3 08/23/2010 1029   ALKPHOS 91 08/23/2013 0923     ALKPHOS 87* 08/26/2011 1108   ALKPHOS 83 08/23/2010 1029   AST 29 08/23/2013 0923   AST 31 08/26/2011 1108   AST 32 08/23/2010 1029   ALT 23 08/23/2013 0923   ALT 25 08/26/2011 1108   ALT 26 08/23/2010 1029   BILITOT 1.22* 08/23/2013 0923   BILITOT 1.20 08/26/2011 1108   BILITOT 0.9 08/23/2010 1029       RADIOGRAPHIC STUDIES: Ct Chest Wo Contrast  08/23/2013   CLINICAL DATA:  Followup left upper lobe lung carcinoma.  EXAM: CT CHEST WITHOUT CONTRAST  TECHNIQUE: Multidetector CT imaging of the chest was performed following the standard protocol without IV contrast.  COMPARISON:  08/24/2012 and 08/26/2011  FINDINGS: Postop changes in the right hemi thorax are stable. Brachytherapy seeds in the anterior left upper lobe and associated scarring are also stable. A few tiny indeterminate pulmonary nodules in the right middle lobe remain stable. A 12 mm ground-glass nodule in the left lung apex on image 16 shows mild progression in size since previous 2 exams, and is suspicious for adenocarcinoma in situ.  No evidence of pleural or pericardial effusion. No evidence of hilar or mediastinal lymphadenopathy. No adenopathy seen elsewhere within the thorax.  Both adrenal glands remain normal in appearance. No suspicious bone lesions identified.1  IMPRESSION: Slowly enlarging 12 mm ground-glass nodule in the left lung apex, suspicious for low-grade adenocarcinoma in situ. Consider surgical evaluation versus continued followup by CT.  No evidence of thoracic lymphadenopathy or other signs of metastatic disease.   Electronically Signed   By: Myles Rosenthal M.D.   On: 08/23/2013 13:22     ASSESSMENT AND PLAN: This is a very pleasant 79 years old white male with history of stage IB non-small cell lung cancer diagnosed in November of 2006 as well as a stage IA non-small cell lung cancer diagnosed in February of 2007 status post right upper lobectomy followed by left upper lobe wedge resection. The patient has been  observation since February of 2007 with no evidence for disease recurrence. I discussed the scan results with the patient today and it showed enlarging 1.2 CM groundglass nodule in the left lung apex. I recommended for him to continue on observation for now with repeat CT scan of the chest without contrast in 3 months for evaluation of this opacity.  He was advised to call me immediately if he has any concerning symptoms in the interval.  All questions were answered. The patient knows to call the clinic with any problems, questions or concerns. We can certainly see the patient much sooner if necessary.

## 2013-08-27 NOTE — Patient Instructions (Addendum)
Followup visit in 3 months with repeat CT scan of the chest. 

## 2013-11-10 ENCOUNTER — Ambulatory Visit (INDEPENDENT_AMBULATORY_CARE_PROVIDER_SITE_OTHER): Payer: Medicare Other | Admitting: Internal Medicine

## 2013-11-10 ENCOUNTER — Encounter: Payer: Self-pay | Admitting: Internal Medicine

## 2013-11-10 VITALS — BP 122/76 | HR 107 | Ht 69.5 in | Wt 197.8 lb

## 2013-11-10 DIAGNOSIS — J3089 Other allergic rhinitis: Secondary | ICD-10-CM

## 2013-11-10 DIAGNOSIS — J4489 Other specified chronic obstructive pulmonary disease: Secondary | ICD-10-CM

## 2013-11-10 DIAGNOSIS — J449 Chronic obstructive pulmonary disease, unspecified: Secondary | ICD-10-CM

## 2013-11-10 DIAGNOSIS — J309 Allergic rhinitis, unspecified: Secondary | ICD-10-CM

## 2013-11-10 DIAGNOSIS — J302 Other seasonal allergic rhinitis: Secondary | ICD-10-CM

## 2013-11-10 DIAGNOSIS — C3410 Malignant neoplasm of upper lobe, unspecified bronchus or lung: Secondary | ICD-10-CM

## 2013-11-10 NOTE — Patient Instructions (Signed)
Order - schedule PFT    Dx COPD  We can continue allergy vaccine 1:10 GO  Please call as needed

## 2013-11-10 NOTE — Progress Notes (Signed)
11/11/11- 80 yoM former smoker followed for COPD, allergic rhinitis, complicated by hx bilateral upper lobe adenoca lung LOV- 11/14/2010 IMPRESSION: CT 08/26/11-images reviewed with him by CD[facility] Stable post-treatment findings as above without CT evidence for  intrathoracic recurrence/local metastasis.  Hepatic steatosis.  Original Report Authenticated By: Arline Asp, M.D.  He considers allergy vaccine okay and helpful. He is also taking Allegra. Continues annual cancer Center followup with no recurrence. Says his breathing is good for now. In December he had a bad cold with productive cough which is resolved. Never needs his rescue inhaler.  11/10/12- 81 yoM former smoker followed for COPD, allergic rhinitis, complicated by hx bilateral upper lobe adenoca lung FOLLOWS FOR: still on allergy vaccine 1:10 GO; couple weeks ago felt full in head.  Increased nasal congestion after cleaning his home with a lot of house dust exposure yesterday. He continues to feel allergy vaccine helps him but notices more postnasal drainage. CT chest 08/24/12 IMPRESSION:  1. Postoperative and post therapeutic changes as above, without  evidence to suggest local recurrence of disease or new metastatic  disease in the thorax.  2. There is a small cluster of 2-3 mm nodules in the periphery of  the right middle lobe which are strongly favored to be related to  benign disease (likely mucoid impaction within terminal  bronchioles). Attention on the 1-year follow-up CT scan may be  useful to confirm their stability or resolution.  3. Dilatation of the pulmonic trunk (3.9 cm in diameter), which  may suggest pulmonary arterial hypertension.  4. Hepatic steatosis.  5. Status post cholecystectomy.  Original Report Authenticated By: Vinnie Langton, M.D.  11/10/13- 82 yoM former smoker followed for COPD, allergic rhinitis, complicated by hx bilateral upper lobe adenoca lung, Lung nodule FOLLOWS FOR: still on Allergy  vaccine 1:10 GO; doing well with it. He says he is better off with allergy shots when without. Now resolving a minor incidental cold. Being followed by oncology for another lung nodule-left upper lobe. Had CT scan and being referred to thoracic surgery CT 08/23/13 IMPRESSION:  Slowly enlarging 12 mm ground-glass nodule in the left lung apex,  suspicious for low-grade adenocarcinoma in situ. Consider surgical  evaluation versus continued followup by CT.  No evidence of thoracic lymphadenopathy or other signs of metastatic  disease.  Electronically Signed  By: Earle Gell M.D.  On: 08/23/2013 13:22  ROS-see HPI Constitutional:   No-   weight loss, night sweats, fevers, chills, fatigue, lassitude. HEENT:   No-  headaches, difficulty swallowing, tooth/dental problems, sore throat,       No-  sneezing, itching, ear ache, +nasal congestion, +post nasal drip,  CV:  No-   chest pain, orthopnea, PND, swelling in lower extremities, anasarca,  dizziness, palpitations Resp: No-   shortness of breath with exertion or at rest.              No-   productive cough,  No non-productive cough,  No- coughing up of blood.              No-   change in color of mucus.  No- wheezing.   Skin: No-   rash or lesions. GI:  No-   heartburn, indigestion, abdominal pain, nausea, vomiting,  GU: MS:  No-   joint pain or swelling.   Neuro-     nothing unusual Psych:  No- change in mood or affect. No depression or anxiety.  No memory loss.  OBJ- Physical Exam General- Alert, Oriented, Affect-appropriate,  Distress- none acute Skin- rash-none, lesions- none, excoriation- none Lymphadenopathy- none Head- atraumatic            Eyes- Gross vision intact, PERRLA, conjunctivae and secretions clear, right eye drifts                 up( old blow-out fx)            Ears- Hearing, canals-normal            Nose- +turbinate edema, no-Septal dev, mucus, polyps, erosion, perforation             Throat- Mallampati III , mucosa  clear , drainage- none, tonsils- atrophic Neck- flexible , trachea midline, no stridor , thyroid nl, carotid no bruit Chest - symmetrical excursion , unlabored           Heart/CV- RRR , no murmur , no gallop  , no rub, nl s1 s2                           - JVD- none , edema- none, stasis changes- none, varices- none           Lung- clear to P&A, wheeze- none, cough- none , dullness-none, rub- none           Chest wall-  Abd- Br/ Gen/ Rectal- Not done, not indicated Extrem- cyanosis- none, clubbing, none, atrophy- none, strength- nl Neuro- grossly intact to observation

## 2013-11-15 ENCOUNTER — Ambulatory Visit (HOSPITAL_COMMUNITY)
Admission: RE | Admit: 2013-11-15 | Discharge: 2013-11-15 | Disposition: A | Discharge status: Home / Self Care | Payer: Medicare Other | Source: Ambulatory Visit | Attending: Internal Medicine | Admitting: Internal Medicine

## 2013-11-15 DIAGNOSIS — Z87891 Personal history of nicotine dependence: Secondary | ICD-10-CM | POA: Insufficient documentation

## 2013-11-15 DIAGNOSIS — J449 Chronic obstructive pulmonary disease, unspecified: Secondary | ICD-10-CM | POA: Insufficient documentation

## 2013-11-15 DIAGNOSIS — R0989 Other specified symptoms and signs involving the circulatory and respiratory systems: Secondary | ICD-10-CM | POA: Insufficient documentation

## 2013-11-15 DIAGNOSIS — R942 Abnormal results of pulmonary function studies: Secondary | ICD-10-CM | POA: Insufficient documentation

## 2013-11-15 DIAGNOSIS — R059 Cough, unspecified: Secondary | ICD-10-CM | POA: Insufficient documentation

## 2013-11-15 DIAGNOSIS — R05 Cough: Secondary | ICD-10-CM | POA: Insufficient documentation

## 2013-11-15 DIAGNOSIS — R0609 Other forms of dyspnea: Secondary | ICD-10-CM | POA: Insufficient documentation

## 2013-11-15 DIAGNOSIS — J4489 Other specified chronic obstructive pulmonary disease: Secondary | ICD-10-CM | POA: Insufficient documentation

## 2013-11-15 LAB — PULMONARY FUNCTION TEST
DL/VA % pred: 91 %
DL/VA: 4.09 ml/min/mmHg/L
DLCO COR % PRED: 49 %
DLCO UNC: 15.62 ml/min/mmHg
DLCO cor: 14.99 ml/min/mmHg
DLCO unc % pred: 51 %
FEF 25-75 PRE: 0.98 L/s
FEF 25-75 Post: 1.73 L/sec
FEF2575-%Change-Post: 75 %
FEF2575-%PRED-PRE: 53 %
FEF2575-%Pred-Post: 94 %
FEV1-%Change-Post: 13 %
FEV1-%PRED-PRE: 64 %
FEV1-%Pred-Post: 73 %
FEV1-Post: 1.96 L
FEV1-Pre: 1.73 L
FEV1FVC-%Change-Post: 5 %
FEV1FVC-%Pred-Pre: 97 %
FEV6-%CHANGE-POST: 9 %
FEV6-%PRED-PRE: 70 %
FEV6-%Pred-Post: 76 %
FEV6-POST: 2.69 L
FEV6-Pre: 2.47 L
FEV6FVC-%CHANGE-POST: 1 %
FEV6FVC-%PRED-POST: 107 %
FEV6FVC-%Pred-Pre: 105 %
FVC-%Change-Post: 7 %
FVC-%Pred-Post: 71 %
FVC-%Pred-Pre: 66 %
FVC-POST: 2.69 L
FVC-PRE: 2.51 L
POST FEV6/FVC RATIO: 100 %
Post FEV1/FVC ratio: 73 %
Pre FEV1/FVC ratio: 69 %
Pre FEV6/FVC Ratio: 98 %
RV % pred: 72 %
RV: 1.86 L
TLC % PRED: 67 %
TLC: 4.57 L

## 2013-11-15 MED ORDER — ALBUTEROL SULFATE (2.5 MG/3ML) 0.083% IN NEBU
2.5000 mg | INHALATION_SOLUTION | Freq: Once | RESPIRATORY_TRACT | Status: AC
  Administered 2013-11-15: 2.5 mg via RESPIRATORY_TRACT

## 2013-11-23 ENCOUNTER — Ambulatory Visit (HOSPITAL_COMMUNITY)
Admission: RE | Admit: 2013-11-23 | Discharge: 2013-11-23 | Disposition: A | Discharge status: Home / Self Care | Payer: Medicare Other | Source: Ambulatory Visit | Attending: Internal Medicine | Admitting: Internal Medicine

## 2013-11-23 ENCOUNTER — Other Ambulatory Visit (HOSPITAL_BASED_OUTPATIENT_CLINIC_OR_DEPARTMENT_OTHER): Payer: Medicare Other

## 2013-11-23 ENCOUNTER — Encounter (HOSPITAL_COMMUNITY): Payer: Self-pay

## 2013-11-23 DIAGNOSIS — R0602 Shortness of breath: Secondary | ICD-10-CM | POA: Insufficient documentation

## 2013-11-23 DIAGNOSIS — R05 Cough: Secondary | ICD-10-CM | POA: Insufficient documentation

## 2013-11-23 DIAGNOSIS — Z85118 Personal history of other malignant neoplasm of bronchus and lung: Secondary | ICD-10-CM

## 2013-11-23 DIAGNOSIS — C3490 Malignant neoplasm of unspecified part of unspecified bronchus or lung: Secondary | ICD-10-CM | POA: Insufficient documentation

## 2013-11-23 DIAGNOSIS — Z902 Acquired absence of lung [part of]: Secondary | ICD-10-CM | POA: Insufficient documentation

## 2013-11-23 DIAGNOSIS — R059 Cough, unspecified: Secondary | ICD-10-CM | POA: Insufficient documentation

## 2013-11-23 HISTORY — DX: Malignant (primary) neoplasm, unspecified: C80.1

## 2013-11-23 LAB — COMPREHENSIVE METABOLIC PANEL (CC13)
ALT: 22 U/L (ref 0–55)
ANION GAP: 10 meq/L (ref 3–11)
AST: 28 U/L (ref 5–34)
Albumin: 3.8 g/dL (ref 3.5–5.0)
Alkaline Phosphatase: 97 U/L (ref 40–150)
BILIRUBIN TOTAL: 1.25 mg/dL — AB (ref 0.20–1.20)
BUN: 13.7 mg/dL (ref 7.0–26.0)
CO2: 27 mEq/L (ref 22–29)
CREATININE: 1.1 mg/dL (ref 0.7–1.3)
Calcium: 9.7 mg/dL (ref 8.4–10.4)
Chloride: 103 mEq/L (ref 98–109)
GLUCOSE: 119 mg/dL (ref 70–140)
Potassium: 4.3 mEq/L (ref 3.5–5.1)
SODIUM: 140 meq/L (ref 136–145)
Total Protein: 7.1 g/dL (ref 6.4–8.3)

## 2013-11-23 LAB — CBC WITH DIFFERENTIAL/PLATELET
BASO%: 0.7 % (ref 0.0–2.0)
Basophils Absolute: 0.1 10*3/uL (ref 0.0–0.1)
EOS ABS: 0.4 10*3/uL (ref 0.0–0.5)
EOS%: 2.6 % (ref 0.0–7.0)
HEMATOCRIT: 48.7 % (ref 38.4–49.9)
HGB: 16.8 g/dL (ref 13.0–17.1)
LYMPH%: 10.9 % — AB (ref 14.0–49.0)
MCH: 33.9 pg — ABNORMAL HIGH (ref 27.2–33.4)
MCHC: 34.6 g/dL (ref 32.0–36.0)
MCV: 98.1 fL — AB (ref 79.3–98.0)
MONO#: 1.2 10*3/uL — ABNORMAL HIGH (ref 0.1–0.9)
MONO%: 8.8 % (ref 0.0–14.0)
NEUT#: 10.8 10*3/uL — ABNORMAL HIGH (ref 1.5–6.5)
NEUT%: 77 % — ABNORMAL HIGH (ref 39.0–75.0)
Platelets: 244 10*3/uL (ref 140–400)
RBC: 4.96 10*6/uL (ref 4.20–5.82)
RDW: 13.5 % (ref 11.0–14.6)
WBC: 14.1 10*3/uL — ABNORMAL HIGH (ref 4.0–10.3)
lymph#: 1.5 10*3/uL (ref 0.9–3.3)

## 2013-11-23 MED ORDER — IOHEXOL 300 MG/ML  SOLN
80.0000 mL | Freq: Once | INTRAMUSCULAR | Status: AC | PRN
  Administered 2013-11-23: 80 mL via INTRAVENOUS

## 2013-11-24 ENCOUNTER — Encounter: Payer: Self-pay | Admitting: Internal Medicine

## 2013-11-24 ENCOUNTER — Ambulatory Visit (HOSPITAL_BASED_OUTPATIENT_CLINIC_OR_DEPARTMENT_OTHER): Payer: Medicare Other | Admitting: Internal Medicine

## 2013-11-24 VITALS — BP 150/72 | HR 89 | Temp 98.4°F | Resp 18 | Ht 69.0 in | Wt 199.4 lb

## 2013-11-24 DIAGNOSIS — C3410 Malignant neoplasm of upper lobe, unspecified bronchus or lung: Secondary | ICD-10-CM

## 2013-11-24 NOTE — Progress Notes (Signed)
[facility] Telephone:([phone]   Fax:([phone]  OFFICE PROGRESS NOTE  Leonard Downing, MD [place] [address]  DIAGNOSIS:  1. Stage IB (T2N0M0) right upper lobe non-small cell lung cancer consistent with adenocarcinoma with bronchoalveolar features, diagnosed in November 2006. 2. 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  PRIOR THERAPY:  1. Status post right upper lobectomy in November 2006. 2. Status post left upper lobe wedge resection with seed implants on November 07, 2005, under the care of Dr Arlyce Dice.  CURRENT THERAPY: Observation.  INTERVAL HISTORY: Kyle Bauer 80 y.o. male returns to the clinic today for annual followup visit. The patient is doing fine with no specific complaints today except for shortness of breath with exertion. He denied having any significant chest pain, cough or hemoptysis. He denied having any significant weight loss or night sweats. He denied having any significant nausea or vomiting. The patient had repeat CT scan of the chest performed recently and he is here for evaluation and discussion of his scan results.  MEDICAL HISTORY: Past Medical History  Diagnosis Date  . Rheumatic fever age 67  . Allergic rhinitis   . Strabismus   . Cancer     lung ca dx'd 2006    ALLERGIES:  has No Known Allergies.  MEDICATIONS:  Current Outpatient Prescriptions  Medication Sig Dispense Refill  . fexofenadine (ALLEGRA) 180 MG tablet Take 180 mg by mouth daily.      Marland Kitchen ibuprofen (ADVIL,MOTRIN) 200 MG tablet Take 200 mg by mouth every 6 (six) hours as needed.      . NON FORMULARY Allergy Vaccine 1:10 GO (w-e)       No current facility-administered medications for this visit.    SURGICAL HISTORY:  Past Surgical History  Procedure Laterality Date  .  Tonsillectomy    . Cholecystectomy    . Right elbow surgery    . Appendectomy    . Total hip arthroplasty      Left  . Colon resection x 2      for perforated colon-colostomy x 6 months  . Lobectomy      RUL-for adenoca T2,N0,MX  . Lul wedge resection      for adenoca with radioactive seeds  . Bilatteral vat's lung resections    . Nasal fracture surgery    . Right arm      REVIEW OF SYSTEMS:  A comprehensive review of systems was negative except for: Respiratory: positive for dyspnea on exertion   PHYSICAL EXAMINATION: General appearance: alert, cooperative and no distress Head: Normocephalic, without obvious abnormality, atraumatic Neck: no adenopathy Resp: clear to auscultation bilaterally Cardio: regular rate and rhythm, S1, S2 normal, no murmur, click, rub or gallop GI: soft, non-tender; bowel sounds normal; no masses,  no organomegaly Extremities: extremities normal, atraumatic, no cyanosis or edema  ECOG PERFORMANCE STATUS: 1 - Symptomatic but completely ambulatory  Blood pressure 150/72, pulse 89, temperature 98.4 F (36.9 C), temperature source Oral, resp. rate 18, height 5\' 9"  (1.753 m), weight 199 lb 6.4 oz (90.447 kg), SpO2 95.00%.  LABORATORY DATA: Lab Results  Component Value Date   WBC 14.1* 11/23/2013   HGB 16.8 11/23/2013   HCT 48.7 11/23/2013   MCV 98.1* 11/23/2013   PLT 244 11/23/2013      Chemistry      Component Value Date/Time   NA  140 11/23/2013 0940   NA 142 08/26/2011 1108   NA 139 08/23/2010 1029   K 4.3 11/23/2013 0940   K 4.3 08/26/2011 1108   K 4.3 08/23/2010 1029   CL 101 08/24/2012 0954   CL 102 08/26/2011 1108   CL 102 08/23/2010 1029   CO2 27 11/23/2013 0940   CO2 27 08/26/2011 1108   CO2 29 08/23/2010 1029   BUN 13.7 11/23/2013 0940   BUN 14 08/26/2011 1108   BUN 12 08/23/2010 1029   CREATININE 1.1 11/23/2013 0940   CREATININE 0.9 08/26/2011 1108   CREATININE 1.07 08/23/2010 1029      Component Value Date/Time   CALCIUM 9.7 11/23/2013  0940   CALCIUM 9.2 08/26/2011 1108   CALCIUM 9.3 08/23/2010 1029   ALKPHOS 97 11/23/2013 0940   ALKPHOS 87* 08/26/2011 1108   ALKPHOS 83 08/23/2010 1029   AST 28 11/23/2013 0940   AST 31 08/26/2011 1108   AST 32 08/23/2010 1029   ALT 22 11/23/2013 0940   ALT 25 08/26/2011 1108   ALT 26 08/23/2010 1029   BILITOT 1.25* 11/23/2013 0940   BILITOT 1.20 08/26/2011 1108   BILITOT 0.9 08/23/2010 1029       RADIOGRAPHIC STUDIES: Ct Chest W Contrast  11/23/2013   CLINICAL DATA Right lung cancer diagnosed 2006. Left lung cancer diagnosed 2007. Status post bilateral lung resection with XRT seed implants. Cough, shortness of breath.  EXAM CT CHEST WITH CONTRAST  TECHNIQUE Multidetector CT imaging of the chest was performed during intravenous contrast administration.  CONTRAST 95mL OMNIPAQUE IOHEXOL 300 MG/ML  SOLN  COMPARISON 08/23/2013  FINDINGS Prior right upper lobectomy with associated volume loss.  Prior left upper lobe wedge resection with brachytherapy seeds in the anterior left lung apex (series 5/image 18).  Faint 12 x 9 mm ground-glass nodule in the left lung apex, unchanged. Possible 2 mm solid component versus vessel (series 5/image 17).  Otherwise, no new/suspicious pulmonary nodules. Prior faint nodularity in the lateral aspect of the right middle lobe is not well visualized. No pleural effusion or pneumothorax.  Visualized thyroid is unremarkable.  The heart is normal in size. No pericardial effusion mild coronary atherosclerosis in the LAD. Atherosclerotic calcifications of the aortic arch.  No suspicious mediastinal, hilar, or axillary lymphadenopathy.  Visualized upper abdomen is notable for moderate hepatic steatosis, cholecystectomy clips, and vascular calcifications.  Degenerative changes of the visualized thoracolumbar spine. Vertebral hemangiomas at T6 and T8.  IMPRESSION Postsurgical/postprocedural changes, as described above.  Stable 12 mm ground-glass nodule in the left lung apex.  Adenocarcinoma cannot be excluded. Follow up by CT is recommended in 12 months, with continued annual surveillance for a minimum of 3 years.  Otherwise, no evidence of recurrent or metastatic disease in the chest.  These recommendations are taken from " Recommendations for the Management of Subsolid Pulmonary Nodules Detected at CT: A Statement from the [facility] " Radiology 2013; 266:1, [id number].  SIGNATURE  Electronically Signed   By: Julian Hy M.D.   On: 11/23/2013 15:51   ASSESSMENT AND PLAN: This is a very pleasant 80 years old white male with history of stage IB non-small cell lung cancer diagnosed in November of 2006 as well as a stage IA non-small cell lung cancer diagnosed in February of 2007 status post right upper lobectomy followed by left upper lobe wedge resection. The patient has been observation since February of 2007 with no evidence for disease recurrence. His recent scan showed a stable and groundglass  opacity in the left lung apex. I discussed the scan results with the patient today.  I recommended for him to continue on observation for now with repeat CT scan of the chest without contrast in 6 months for evaluation of this opacity.  He was advised to call me immediately if he has any concerning symptoms in the interval.  All questions were answered. The patient knows to call the clinic with any problems, questions or concerns. We can certainly see the patient much sooner if necessary.  Disclaimer: This note was dictated with voice recognition software. Similar sounding words can inadvertently be transcribed and may not be corrected upon review.

## 2013-12-03 ENCOUNTER — Ambulatory Visit (INDEPENDENT_AMBULATORY_CARE_PROVIDER_SITE_OTHER): Payer: Medicare Other

## 2013-12-03 DIAGNOSIS — J309 Allergic rhinitis, unspecified: Secondary | ICD-10-CM

## 2013-12-05 ENCOUNTER — Encounter: Payer: Self-pay | Admitting: Internal Medicine

## 2013-12-05 NOTE — Assessment & Plan Note (Signed)
He is satisfied to continue allergy vaccine 1:10. Discussed upcoming spring pollen season

## 2013-12-05 NOTE — Assessment & Plan Note (Signed)
New nodule left upper lobe. Being referred to thoracic surgery

## 2013-12-05 NOTE — Assessment & Plan Note (Signed)
Plan PFT 

## 2013-12-07 ENCOUNTER — Other Ambulatory Visit: Payer: Self-pay | Admitting: Medical Oncology

## 2013-12-07 DIAGNOSIS — C3410 Malignant neoplasm of upper lobe, unspecified bronchus or lung: Secondary | ICD-10-CM

## 2014-03-16 ENCOUNTER — Ambulatory Visit (INDEPENDENT_AMBULATORY_CARE_PROVIDER_SITE_OTHER): Payer: Medicare Other

## 2014-03-16 DIAGNOSIS — J309 Allergic rhinitis, unspecified: Secondary | ICD-10-CM

## 2014-04-29 ENCOUNTER — Telehealth: Payer: Self-pay | Admitting: Medical Oncology

## 2014-04-29 ENCOUNTER — Telehealth: Payer: Self-pay | Admitting: Internal Medicine

## 2014-04-29 NOTE — Telephone Encounter (Signed)
request f/u appt -I transferred his call to [facility].

## 2014-04-29 NOTE — Telephone Encounter (Signed)
Pt cld in to make f/u appt for the CT he was orderd to have on POF 3/25-per Diane to sch 9/10-gave pt appt time & date

## 2014-05-24 ENCOUNTER — Encounter (HOSPITAL_COMMUNITY): Payer: Self-pay

## 2014-05-24 ENCOUNTER — Ambulatory Visit (HOSPITAL_COMMUNITY)
Admission: RE | Admit: 2014-05-24 | Discharge: 2014-05-24 | Disposition: A | Discharge status: Home / Self Care | Payer: Medicare Other | Source: Ambulatory Visit | Attending: Internal Medicine | Admitting: Internal Medicine

## 2014-05-24 ENCOUNTER — Other Ambulatory Visit (HOSPITAL_BASED_OUTPATIENT_CLINIC_OR_DEPARTMENT_OTHER): Payer: Medicare Other

## 2014-05-24 DIAGNOSIS — C3410 Malignant neoplasm of upper lobe, unspecified bronchus or lung: Secondary | ICD-10-CM

## 2014-05-24 DIAGNOSIS — Z9889 Other specified postprocedural states: Secondary | ICD-10-CM | POA: Insufficient documentation

## 2014-05-24 DIAGNOSIS — K7689 Other specified diseases of liver: Secondary | ICD-10-CM | POA: Diagnosis not present

## 2014-05-24 DIAGNOSIS — R599 Enlarged lymph nodes, unspecified: Secondary | ICD-10-CM | POA: Insufficient documentation

## 2014-05-24 DIAGNOSIS — R918 Other nonspecific abnormal finding of lung field: Secondary | ICD-10-CM | POA: Diagnosis not present

## 2014-05-24 LAB — CBC WITH DIFFERENTIAL/PLATELET
BASO%: 0.3 % (ref 0.0–2.0)
BASOS ABS: 0 10*3/uL (ref 0.0–0.1)
EOS ABS: 0.1 10*3/uL (ref 0.0–0.5)
EOS%: 0.9 % (ref 0.0–7.0)
HEMATOCRIT: 45.2 % (ref 38.4–49.9)
HEMOGLOBIN: 15.9 g/dL (ref 13.0–17.1)
LYMPH#: 1.1 10*3/uL (ref 0.9–3.3)
LYMPH%: 9.4 % — AB (ref 14.0–49.0)
MCH: 34.4 pg — ABNORMAL HIGH (ref 27.2–33.4)
MCHC: 35.2 g/dL (ref 32.0–36.0)
MCV: 97.8 fL (ref 79.3–98.0)
MONO#: 0.8 10*3/uL (ref 0.1–0.9)
MONO%: 7.4 % (ref 0.0–14.0)
NEUT#: 9.3 10*3/uL — ABNORMAL HIGH (ref 1.5–6.5)
NEUT%: 82 % — AB (ref 39.0–75.0)
Platelets: 273 10*3/uL (ref 140–400)
RBC: 4.62 10*6/uL (ref 4.20–5.82)
RDW: 12.9 % (ref 11.0–14.6)
WBC: 11.4 10*3/uL — AB (ref 4.0–10.3)

## 2014-05-24 LAB — COMPREHENSIVE METABOLIC PANEL (CC13)
ALT: 14 U/L (ref 0–55)
ANION GAP: 9 meq/L (ref 3–11)
AST: 24 U/L (ref 5–34)
Albumin: 3.6 g/dL (ref 3.5–5.0)
Alkaline Phosphatase: 84 U/L (ref 40–150)
BILIRUBIN TOTAL: 0.88 mg/dL (ref 0.20–1.20)
BUN: 12.8 mg/dL (ref 7.0–26.0)
CALCIUM: 9.3 mg/dL (ref 8.4–10.4)
CHLORIDE: 109 meq/L (ref 98–109)
CO2: 24 meq/L (ref 22–29)
CREATININE: 0.9 mg/dL (ref 0.7–1.3)
GLUCOSE: 110 mg/dL (ref 70–140)
Potassium: 4.2 mEq/L (ref 3.5–5.1)
Sodium: 142 mEq/L (ref 136–145)
Total Protein: 6.7 g/dL (ref 6.4–8.3)

## 2014-05-24 MED ORDER — IOHEXOL 300 MG/ML  SOLN
80.0000 mL | Freq: Once | INTRAMUSCULAR | Status: AC | PRN
  Administered 2014-05-24: 80 mL via INTRAVENOUS

## 2014-05-26 ENCOUNTER — Ambulatory Visit (HOSPITAL_BASED_OUTPATIENT_CLINIC_OR_DEPARTMENT_OTHER): Payer: Medicare Other | Admitting: Internal Medicine

## 2014-05-26 ENCOUNTER — Encounter: Payer: Self-pay | Admitting: Internal Medicine

## 2014-05-26 ENCOUNTER — Telehealth: Payer: Self-pay | Admitting: Internal Medicine

## 2014-05-26 VITALS — BP 141/72 | HR 70 | Temp 98.4°F | Resp 18 | Ht 69.0 in | Wt 190.4 lb

## 2014-05-26 DIAGNOSIS — C3410 Malignant neoplasm of upper lobe, unspecified bronchus or lung: Secondary | ICD-10-CM

## 2014-05-26 DIAGNOSIS — R918 Other nonspecific abnormal finding of lung field: Secondary | ICD-10-CM

## 2014-05-26 DIAGNOSIS — Z85118 Personal history of other malignant neoplasm of bronchus and lung: Secondary | ICD-10-CM

## 2014-05-26 NOTE — Telephone Encounter (Signed)
gv adn printed appt sched and avs for pt for Dec...sed added tx.

## 2014-05-26 NOTE — Progress Notes (Signed)
[facility] [phone]   Fax:([phone]  OFFICE PROGRESS NOTE  Leonard Downing, MD [place] [address]  DIAGNOSIS:  1. Stage IB (T2N0M0) right upper lobe non-small cell lung cancer consistent with adenocarcinoma with bronchoalveolar features, diagnosed in November 2006. 2. 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  PRIOR THERAPY:  1. Status post right upper lobectomy in November 2006. 2. Status post left upper lobe wedge resection with seed implants on November 07, 2005, under the care of Dr Arlyce Dice.  CURRENT THERAPY: Observation.  INTERVAL HISTORY: Kyle Bauer 80 y.o. male returns to the clinic today for annual followup visit. Has been on observation for almost 8 years now. The patient is doing fine with no specific complaints today except for shortness of breath with exertion. He denied having any significant chest pain, cough or hemoptysis. He denied having any significant weight loss or night sweats. He denied having any significant nausea or vomiting. The patient had repeat CT scan of the chest performed recently and he is here for evaluation and discussion of his scan results.  MEDICAL HISTORY: Past Medical History  Diagnosis Date  . Rheumatic fever age 48  . Allergic rhinitis   . Strabismus   . Cancer     lung ca dx'd 2006    ALLERGIES:  has No Known Allergies.  MEDICATIONS:  Current Outpatient Prescriptions  Medication Sig Dispense Refill  . fexofenadine (ALLEGRA) 180 MG tablet Take 180 mg by mouth daily.      Marland Kitchen ibuprofen (ADVIL,MOTRIN) 200 MG tablet Take 200 mg by mouth every 6 (six) hours as needed.      . NON FORMULARY Allergy Vaccine 1:10 GO (w-e)      . omeprazole (PRILOSEC) 40 MG capsule Take 40 mg by mouth daily.       No current facility-administered  medications for this visit.    SURGICAL HISTORY:  Past Surgical History  Procedure Laterality Date  . Tonsillectomy    . Cholecystectomy    . Right elbow surgery    . Appendectomy    . Total hip arthroplasty      Left  . Colon resection x 2      for perforated colon-colostomy x 6 months  . Lobectomy      RUL-for adenoca T2,N0,MX  . Lul wedge resection      for adenoca with radioactive seeds  . Bilatteral vat's lung resections    . Nasal fracture surgery    . Right arm      REVIEW OF SYSTEMS:  A comprehensive review of systems was negative except for: Respiratory: positive for dyspnea on exertion   PHYSICAL EXAMINATION: General appearance: alert, cooperative and no distress Head: Normocephalic, without obvious abnormality, atraumatic Neck: no adenopathy Resp: clear to auscultation bilaterally Cardio: regular rate and rhythm, S1, S2 normal, no murmur, click, rub or gallop GI: soft, non-tender; bowel sounds normal; no masses,  no organomegaly Extremities: extremities normal, atraumatic, no cyanosis or edema  ECOG PERFORMANCE STATUS: 1 - Symptomatic but completely ambulatory  Blood pressure 141/72, pulse 70, temperature 98.4 F (36.9 C), temperature source Oral, resp. rate 18, height 5\' 9"  (1.753 m), weight 190 lb 6.4 oz (86.365 kg), SpO2 98.00%.  LABORATORY DATA: Lab Results  Component Value Date   WBC 11.4* 05/24/2014   HGB 15.9 05/24/2014   HCT 45.2 05/24/2014  MCV 97.8 05/24/2014   PLT 273 05/24/2014      Chemistry      Component Value Date/Time   NA 142 05/24/2014 1231   NA 142 08/26/2011 1108   NA 139 08/23/2010 1029   K 4.2 05/24/2014 1231   K 4.3 08/26/2011 1108   K 4.3 08/23/2010 1029   CL 101 08/24/2012 0954   CL 102 08/26/2011 1108   CL 102 08/23/2010 1029   CO2 24 05/24/2014 1231   CO2 27 08/26/2011 1108   CO2 29 08/23/2010 1029   BUN 12.8 05/24/2014 1231   BUN 14 08/26/2011 1108   BUN 12 08/23/2010 1029   CREATININE 0.9 05/24/2014 1231   CREATININE 0.9 08/26/2011  1108   CREATININE 1.07 08/23/2010 1029      Component Value Date/Time   CALCIUM 9.3 05/24/2014 1231   CALCIUM 9.2 08/26/2011 1108   CALCIUM 9.3 08/23/2010 1029   ALKPHOS 84 05/24/2014 1231   ALKPHOS 87* 08/26/2011 1108   ALKPHOS 83 08/23/2010 1029   AST 24 05/24/2014 1231   AST 31 08/26/2011 1108   AST 32 08/23/2010 1029   ALT 14 05/24/2014 1231   ALT 25 08/26/2011 1108   ALT 26 08/23/2010 1029   BILITOT 0.88 05/24/2014 1231   BILITOT 1.20 08/26/2011 1108   BILITOT 0.9 08/23/2010 1029       RADIOGRAPHIC STUDIES: Ct Chest W Contrast  05/24/2014   CLINICAL DATA:  Followup lung carcinoma.  EXAM: CT CHEST WITH CONTRAST  TECHNIQUE: Multidetector CT imaging of the chest was performed during intravenous contrast administration.  CONTRAST:  8mL OMNIPAQUE IOHEXOL 300 MG/ML  SOLN  COMPARISON:  11/23/2013 and 08/23/2013  FINDINGS: Mediastinum/Hilar Regions: No masses or pathologically enlarged lymph nodes identified.  Other Thoracic Lymphadenopathy:  None.  Lungs: Prior right upper lobectomy again demonstrated. Brachytherapy seeds again seen in the anterior left upper lung field. Sub-solid nodules again seen in the left lung apex which measures approximately 12 mm and is not significant changed. Low-grade adenocarcinoma in situ cannot be excluded.  At least 4 pulmonary nodules are seen in the left lower lobe measuring up to 5 mm on image 49. These are nonspecific, and could indicate an inflammatory or infectious process although pulmonary metastases cannot be excluded.  Pleura:  No evidence of effusion or mass.  Vascular/Cardiac: No thoracic aortic aneurysm or other significant abnormality identified.  Musculoskeletal:  No suspicious bone lesions identified.  Other: No adrenal masses identified. Hepatic steatosis again noted.  IMPRESSION: Several new indeterminate sub-cm nodules in the left lower lobe measuring up to 5 mm. Differential diagnosis includes infectious or inflammatory etiologies and pulmonary metastases.  Consider short-term followup by CT in 3-4 months.  Stable sub-solid nodule in left lung apex measuring 12 mm. This remains suspicious for low-grade adenocarcinoma in situ, and continued followup by CT is recommended.  No evidence of lymphadenopathy or pleural effusion.  Hepatic steatosis again noted.   Electronically Signed   By: Earle Gell M.D.   On: 05/24/2014 14:48   ASSESSMENT AND PLAN: This is a very pleasant 80 years old white male with history of stage IB non-small cell lung cancer diagnosed in November of 2006 as well as a stage IA non-small cell lung cancer diagnosed in February of 2007 status post right upper lobectomy followed by left upper lobe wedge resection. The patient has been observation since February of 2007 with no evidence for disease recurrence by the recent CT scan of the chest showed new subcentimeter pulmonary  nodules mainly on the left side concerning for inflammatory versus pulmonary metastasis. The patient has been on annual surveillance. I discussed the scan results and showed them the images of the scan. I recommended for him to have repeat CT scan of the chest in 3 months for reevaluation of these nodules, and he was also advised to call immediately if he has any concerning symptoms in the interval. All questions were answered. The patient knows to call the clinic with any problems, questions or concerns. We can certainly see the patient much sooner if necessary.  Disclaimer: This note was dictated with voice recognition software. Similar sounding words can inadvertently be transcribed and may not be corrected upon review.

## 2014-07-07 ENCOUNTER — Ambulatory Visit (INDEPENDENT_AMBULATORY_CARE_PROVIDER_SITE_OTHER): Payer: Medicare Other

## 2014-07-07 DIAGNOSIS — J309 Allergic rhinitis, unspecified: Secondary | ICD-10-CM

## 2014-07-20 ENCOUNTER — Encounter: Payer: Self-pay | Admitting: Internal Medicine

## 2014-07-20 ENCOUNTER — Telehealth: Payer: Self-pay | Admitting: Internal Medicine

## 2014-07-20 NOTE — Telephone Encounter (Signed)
Called pt. appt scheduled to see CDY tomorrow at 60.

## 2014-07-21 ENCOUNTER — Ambulatory Visit (INDEPENDENT_AMBULATORY_CARE_PROVIDER_SITE_OTHER): Payer: Medicare Other | Admitting: Internal Medicine

## 2014-07-21 ENCOUNTER — Encounter: Payer: Self-pay | Admitting: Internal Medicine

## 2014-07-21 VITALS — BP 132/78 | HR 83 | Ht 69.5 in | Wt 187.2 lb

## 2014-07-21 DIAGNOSIS — J449 Chronic obstructive pulmonary disease, unspecified: Secondary | ICD-10-CM

## 2014-07-21 DIAGNOSIS — R221 Localized swelling, mass and lump, neck: Secondary | ICD-10-CM

## 2014-07-21 DIAGNOSIS — J309 Allergic rhinitis, unspecified: Secondary | ICD-10-CM

## 2014-07-21 DIAGNOSIS — R918 Other nonspecific abnormal finding of lung field: Secondary | ICD-10-CM

## 2014-07-21 DIAGNOSIS — J3089 Other allergic rhinitis: Principal | ICD-10-CM

## 2014-07-21 DIAGNOSIS — J302 Other seasonal allergic rhinitis: Secondary | ICD-10-CM

## 2014-07-21 MED ORDER — AZITHROMYCIN 250 MG PO TABS: ORAL_TABLET | ORAL | Status: DC

## 2014-07-21 NOTE — Progress Notes (Signed)
11/11/11- 80 yoM former smoker followed for COPD, allergic rhinitis, complicated by hx bilateral upper lobe adenoca lung LOV- 11/14/2010 IMPRESSION: CT 08/26/11-images reviewed with him by CDY Stable post-treatment findings as above without CT evidence for  intrathoracic recurrence/local metastasis.  Hepatic steatosis.  Original Report Authenticated By: Arline Asp, M.D.  He considers allergy vaccine okay and helpful. He is also taking Allegra. Continues annual cancer Center followup with no recurrence. Says his breathing is good for now. In December he had a bad cold with productive cough which is resolved. Never needs his rescue inhaler.  11/10/12- 81 yoM former smoker followed for COPD, allergic rhinitis, complicated by hx bilateral upper lobe adenoca lung FOLLOWS FOR: still on allergy vaccine 1:10 GO; couple weeks ago felt full in head.  Increased nasal congestion after cleaning his home with a lot of house dust exposure yesterday. He continues to feel allergy vaccine helps him but notices more postnasal drainage. CT chest 08/24/12 IMPRESSION:  1. Postoperative and post therapeutic changes as above, without  evidence to suggest local recurrence of disease or new metastatic  disease in the thorax.  2. There is a small cluster of 2-3 mm nodules in the periphery of  the right middle lobe which are strongly favored to be related to  benign disease (likely mucoid impaction within terminal  bronchioles). Attention on the 1-year follow-up CT scan may be  useful to confirm their stability or resolution.  3. Dilatation of the pulmonic trunk (3.9 cm in diameter), which  may suggest pulmonary arterial hypertension.  4. Hepatic steatosis.  5. Status post cholecystectomy.  Original Report Authenticated By: Vinnie Langton, M.D.  11/10/13- 82 yoM former smoker followed for COPD, allergic rhinitis, complicated by hx bilateral upper lobe adenoca lung, Lung nodule FOLLOWS FOR: still on Allergy  vaccine 1:10 GO; doing well with it. He says he is better off with allergy shots when without. Now resolving a minor incidental cold. Being followed by oncology for another lung nodule-left upper lobe. Had CT scan and being referred to thoracic surgery CT 08/23/13 IMPRESSION:  Slowly enlarging 12 mm ground-glass nodule in the left lung apex,  suspicious for low-grade adenocarcinoma in situ. Consider surgical  evaluation versus continued followup by CT.  No evidence of thoracic lymphadenopathy or other signs of metastatic  disease.  Electronically Signed  By: Earle Gell M.D.  On: 08/23/2013 13:22  07/21/14- 75 yoM former smoker followed for COPD, allergic rhinitis, complicated by hx bilateral upper lobe adenoca lung, Lung nodule  Allergy vaccine 1:10 GO;  FOLLOWS FOR:c/o swelling in neck on right side of neck,tender x 3wks.-sob with exertion at times,no wheezing,cough occass. white,pnd,sorethroat 3 wks. ago,still taking allergy shots,hot flash at times,Finished Amoxicillin and Prednisone 2 wks. ago Onset of sore throat 6 weeks ago after he thought he scratched the right side of his throat swallowing something. Then noted tender mass right neck under the mandible. Primary physician gave prednisone and amoxicillin initially for sore throat. Patient wants to try stronger antibiotic. No fever or purulent sputum. CT chest 05/24/14 IMPRESSION: Several new indeterminate sub-cm nodules in the left lower lobe measuring up to 5 mm. Differential diagnosis includes infectious or inflammatory etiologies and pulmonary metastases. Consider short-term followup by CT in 3-4 months. No evidence of lymphadenopathy or pleural effusion. Hepatic steatosis again noted. Electronically Signed  By: Earle Gell M.D.  On: 05/24/2014 14:48 PFT: 11/15/2013- FEV1 reduced because of low lung volume, little response to bronchodilator, moderate restriction, moderately severe reduction of diffusion . ROS-see  HPI Constitutional:   No-   weight loss, night sweats, fevers, chills, fatigue, lassitude. HEENT:   No-  headaches, difficulty swallowing, tooth/dental problems, +sore throat,       No-  sneezing, itching, ear ache, nasal congestion, post nasal drip,  CV:  No-   chest pain, orthopnea, PND, swelling in lower extremities, anasarca,   dizziness, palpitations Resp: No-   shortness of breath with exertion or at rest.              No-   productive cough,  No non-productive cough,  No- coughing up of blood.              No-   change in color of mucus.  No- wheezing.   Skin: No-   rash or lesions. GI:  No-   heartburn, indigestion, abdominal pain, nausea, vomiting,  GU: n. MS:  No-   joint pain or swelling.   Neuro-     nothing unusual Psych:  No- change in mood or affect. No depression or anxiety.  No memory loss.  OBJ- Physical Exam General- Alert, Oriented, Affect-appropriate, Distress- none acute Skin- rash-none, lesions- none, excoriation- none Lymphadenopathy- none Head- atraumatic            Eyes- Gross vision intact, PERRLA, conjunctivae and secretions clear, +right eye drifts up( old blow-out fx)            Ears- + hard of hearing            Nose- +turbinate edema, no-Septal dev, mucus, polyps, erosion, perforation             Throat- Mallampati III , mucosa clear , drainage- none, tonsils- atrophic, + dentures Neck- flexible , trachea midline, no stridor , thyroid nl, carotid no bruit,  +2-3 cm R submandibular node-firm Chest - symmetrical excursion , unlabored           Heart/CV- RRR , no murmur , no gallop  , no rub, nl s1 s2                           - JVD- none , edema- none, stasis changes- none, varices- none           Lung- clear to P&A, wheeze- none, cough- none , dullness-none, rub- none           Chest wall-  Abd- Br/ Gen/ Rectal- Not done, not indicated Extrem- cyanosis- none, clubbing, none, atrophy- none, strength- nl Neuro- grossly intact to observation

## 2014-07-21 NOTE — Patient Instructions (Addendum)
Script for Zpak antibiotic. If the lymph node in your neck doesn't get better, please call us next week.  Keep your appointment with  CT and then Dr Julien Nordmann

## 2014-07-22 DIAGNOSIS — R918 Other nonspecific abnormal finding of lung field: Secondary | ICD-10-CM | POA: Insufficient documentation

## 2014-07-22 DIAGNOSIS — R221 Localized swelling, mass and lump, neck: Secondary | ICD-10-CM | POA: Insufficient documentation

## 2014-07-22 NOTE — Assessment & Plan Note (Signed)
Bilateral lung nodules seen on imaging. These might be inflammatory, but very high risk for recurrent adenocarcinomas. He is being followed by oncology

## 2014-07-22 NOTE — Assessment & Plan Note (Signed)
He has been satisfied to continue allergy vaccine at 1:10 GO without problem

## 2014-07-22 NOTE — Assessment & Plan Note (Signed)
There is all most certainly some obstructive component but he seems well controlled

## 2014-07-22 NOTE — Assessment & Plan Note (Signed)
He relates this to scratching the right side of his throat 6 weeks before this visit. He did not respond to prednisone and amoxicillin and asks to try another antibiotic. There will be no harm in trying a Z-Pak, but I suspect a cervical node metastasis from his previous lung cancers. If he fails to respond quickly he will need CT of his neck. He does have follow-up pending with oncology. Plan-Z-Pak. Let us know if neck mass does not improve

## 2014-08-25 ENCOUNTER — Other Ambulatory Visit (HOSPITAL_BASED_OUTPATIENT_CLINIC_OR_DEPARTMENT_OTHER): Payer: Medicare Other

## 2014-08-25 ENCOUNTER — Ambulatory Visit (HOSPITAL_COMMUNITY)
Admission: RE | Admit: 2014-08-25 | Discharge: 2014-08-25 | Disposition: A | Discharge status: Home / Self Care | Payer: Medicare Other | Source: Ambulatory Visit | Attending: Internal Medicine | Admitting: Internal Medicine

## 2014-08-25 ENCOUNTER — Encounter (HOSPITAL_COMMUNITY): Payer: Self-pay

## 2014-08-25 DIAGNOSIS — C3432 Malignant neoplasm of lower lobe, left bronchus or lung: Secondary | ICD-10-CM | POA: Diagnosis not present

## 2014-08-25 DIAGNOSIS — C3410 Malignant neoplasm of upper lobe, unspecified bronchus or lung: Secondary | ICD-10-CM

## 2014-08-25 DIAGNOSIS — Z85118 Personal history of other malignant neoplasm of bronchus and lung: Secondary | ICD-10-CM

## 2014-08-25 DIAGNOSIS — C3490 Malignant neoplasm of unspecified part of unspecified bronchus or lung: Secondary | ICD-10-CM | POA: Diagnosis present

## 2014-08-25 LAB — CBC WITH DIFFERENTIAL/PLATELET
BASO%: 0.3 % (ref 0.0–2.0)
BASOS ABS: 0.1 10*3/uL (ref 0.0–0.1)
EOS%: 0.2 % (ref 0.0–7.0)
Eosinophils Absolute: 0 10*3/uL (ref 0.0–0.5)
HEMATOCRIT: 49 % (ref 38.4–49.9)
HEMOGLOBIN: 16.8 g/dL (ref 13.0–17.1)
LYMPH#: 1.8 10*3/uL (ref 0.9–3.3)
LYMPH%: 8.9 % — ABNORMAL LOW (ref 14.0–49.0)
MCH: 32.7 pg (ref 27.2–33.4)
MCHC: 34.3 g/dL (ref 32.0–36.0)
MCV: 95.3 fL (ref 79.3–98.0)
MONO#: 2.5 10*3/uL — AB (ref 0.1–0.9)
MONO%: 12.4 % (ref 0.0–14.0)
NEUT#: 15.6 10*3/uL — ABNORMAL HIGH (ref 1.5–6.5)
NEUT%: 78.2 % — AB (ref 39.0–75.0)
Platelets: 440 10*3/uL — ABNORMAL HIGH (ref 140–400)
RBC: 5.14 10*6/uL (ref 4.20–5.82)
RDW: 12.8 % (ref 11.0–14.6)
WBC: 19.9 10*3/uL — AB (ref 4.0–10.3)

## 2014-08-25 LAB — COMPREHENSIVE METABOLIC PANEL (CC13)
ALT: 18 U/L (ref 0–55)
AST: 21 U/L (ref 5–34)
Albumin: 3.7 g/dL (ref 3.5–5.0)
Alkaline Phosphatase: 92 U/L (ref 40–150)
Anion Gap: 12 mEq/L — ABNORMAL HIGH (ref 3–11)
BUN: 27 mg/dL — AB (ref 7.0–26.0)
CALCIUM: 10 mg/dL (ref 8.4–10.4)
CO2: 29 mEq/L (ref 22–29)
CREATININE: 1.1 mg/dL (ref 0.7–1.3)
Chloride: 101 mEq/L (ref 98–109)
EGFR: 66 mL/min/{1.73_m2} — ABNORMAL LOW (ref >90)
Glucose: 88 mg/dl (ref 70–140)
POTASSIUM: 4.1 meq/L (ref 3.5–5.1)
Sodium: 142 mEq/L (ref 136–145)
Total Bilirubin: 0.6 mg/dL (ref 0.20–1.20)
Total Protein: 6.9 g/dL (ref 6.4–8.3)

## 2014-08-25 MED ORDER — IOHEXOL 300 MG/ML  SOLN
80.0000 mL | Freq: Once | INTRAMUSCULAR | Status: AC | PRN
  Administered 2014-08-25: 80 mL via INTRAVENOUS

## 2014-09-01 ENCOUNTER — Encounter: Payer: Self-pay | Admitting: Internal Medicine

## 2014-09-01 ENCOUNTER — Ambulatory Visit (HOSPITAL_BASED_OUTPATIENT_CLINIC_OR_DEPARTMENT_OTHER): Payer: Medicare Other | Admitting: Internal Medicine

## 2014-09-01 ENCOUNTER — Telehealth: Payer: Self-pay | Admitting: Internal Medicine

## 2014-09-01 VITALS — BP 140/73 | HR 93 | Temp 99.1°F | Resp 17 | Ht 69.5 in | Wt 177.5 lb

## 2014-09-01 DIAGNOSIS — D72829 Elevated white blood cell count, unspecified: Secondary | ICD-10-CM

## 2014-09-01 DIAGNOSIS — Z85118 Personal history of other malignant neoplasm of bronchus and lung: Secondary | ICD-10-CM

## 2014-09-01 DIAGNOSIS — R918 Other nonspecific abnormal finding of lung field: Secondary | ICD-10-CM

## 2014-09-01 DIAGNOSIS — C3412 Malignant neoplasm of upper lobe, left bronchus or lung: Secondary | ICD-10-CM

## 2014-09-01 NOTE — Telephone Encounter (Signed)
Pt confirmed labs/ov per 12/17 POF, gave pt AVS...Marland KitchenMarland KitchenMarland Kitchen KJ

## 2014-09-01 NOTE — Progress Notes (Signed)
[facility] Telephone:(336) [phone]   Fax:(336) [phone]  OFFICE PROGRESS NOTE  Leonard Downing, MD [place] [address]  DIAGNOSIS:  1. Stage IB (T2N0M0) right upper lobe non-small cell lung cancer consistent with adenocarcinoma with bronchoalveolar features, diagnosed in November 2006. 2. 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  PRIOR THERAPY:  1. Status post right upper lobectomy in November 2006. 2. Status post left upper lobe wedge resection with seed implants on November 07, 2005, under the care of Dr Arlyce Dice.  CURRENT THERAPY: Observation.  INTERVAL HISTORY: Kyle Bauer 80 y.o. male returns to the clinic today for annual followup visit. He has been on observation since 2006. The patient is doing fine with no specific complaints today except for shortness of breath with exertion. He denied having any significant chest pain, cough or hemoptysis. He denied having any significant weight loss or night sweats. He denied having any significant nausea or vomiting. The patient had repeat CT scan of the chest performed recently and he is here for evaluation and discussion of his scan results.  MEDICAL HISTORY: Past Medical History  Diagnosis Date  . Rheumatic fever age 26  . Allergic rhinitis   . Strabismus   . Cancer     lung ca dx'd 2006    ALLERGIES:  has No Known Allergies.  MEDICATIONS:  Current Outpatient Prescriptions  Medication Sig Dispense Refill  . Aspirin-Acetaminophen (GOODYS BODY PAIN PO) Take by mouth as needed.    . fexofenadine (ALLEGRA) 180 MG tablet Take 180 mg by mouth daily.    Marland Kitchen ibuprofen (ADVIL,MOTRIN) 200 MG tablet Take 200 mg by mouth every 6 (six) hours as needed.    Marland Kitchen omeprazole (PRILOSEC) 40 MG capsule Take 40 mg by mouth daily.    . vitamin C (ASCORBIC ACID)  500 MG tablet Take 500 mg by mouth daily.     No current facility-administered medications for this visit.    SURGICAL HISTORY:  Past Surgical History  Procedure Laterality Date  . Tonsillectomy    . Cholecystectomy    . Right elbow surgery    . Appendectomy    . Total hip arthroplasty      Left  . Colon resection x 2      for perforated colon-colostomy x 6 months  . Lobectomy      RUL-for adenoca T2,N0,MX  . Lul wedge resection      for adenoca with radioactive seeds  . Bilatteral vat's lung resections    . Nasal fracture surgery    . Right arm      REVIEW OF SYSTEMS:  Constitutional: negative Eyes: negative Ears, nose, mouth, throat, and face: negative Respiratory: positive for dyspnea on exertion Cardiovascular: negative Gastrointestinal: negative Genitourinary:negative Integument/breast: negative Hematologic/lymphatic: negative Musculoskeletal:negative Neurological: negative Behavioral/Psych: negative Endocrine: negative Allergic/Immunologic: negative   PHYSICAL EXAMINATION: General appearance: alert, cooperative and no distress Head: Normocephalic, without obvious abnormality, atraumatic Neck: no adenopathy Lymph nodes: Cervical, supraclavicular, and axillary nodes normal. Resp: clear to auscultation bilaterally Back: symmetric, no curvature. ROM normal. No CVA tenderness. Cardio: regular rate and rhythm, S1, S2 normal, no murmur, click, rub or gallop GI: soft, non-tender; bowel sounds normal; no masses,  no organomegaly Extremities: extremities normal, atraumatic, no cyanosis or edema Neurologic: Alert and oriented X 3, normal strength and tone. Normal symmetric reflexes. Normal coordination and gait  ECOG  PERFORMANCE STATUS: 1 - Symptomatic but completely ambulatory  Blood pressure 140/73, pulse 93, temperature 99.1 F (37.3 C), temperature source Oral, resp. rate 17, height 5' 9.5" (1.765 m), weight 177 lb 8 oz (80.513 kg), SpO2 97 %.  LABORATORY  DATA: Lab Results  Component Value Date   WBC 19.9* 08/25/2014   HGB 16.8 08/25/2014   HCT 49.0 08/25/2014   MCV 95.3 08/25/2014   PLT 440* 08/25/2014      Chemistry      Component Value Date/Time   NA 142 08/25/2014 0943   NA 142 08/26/2011 1108   NA 139 08/23/2010 1029   K 4.1 08/25/2014 0943   K 4.3 08/26/2011 1108   K 4.3 08/23/2010 1029   CL 101 08/24/2012 0954   CL 102 08/26/2011 1108   CL 102 08/23/2010 1029   CO2 29 08/25/2014 0943   CO2 27 08/26/2011 1108   CO2 29 08/23/2010 1029   BUN 27.0* 08/25/2014 0943   BUN 14 08/26/2011 1108   BUN 12 08/23/2010 1029   CREATININE 1.1 08/25/2014 0943   CREATININE 0.9 08/26/2011 1108   CREATININE 1.07 08/23/2010 1029      Component Value Date/Time   CALCIUM 10.0 08/25/2014 0943   CALCIUM 9.2 08/26/2011 1108   CALCIUM 9.3 08/23/2010 1029   ALKPHOS 92 08/25/2014 0943   ALKPHOS 87* 08/26/2011 1108   ALKPHOS 83 08/23/2010 1029   AST 21 08/25/2014 0943   AST 31 08/26/2011 1108   AST 32 08/23/2010 1029   ALT 18 08/25/2014 0943   ALT 25 08/26/2011 1108   ALT 26 08/23/2010 1029   BILITOT 0.60 08/25/2014 0943   BILITOT 1.20 08/26/2011 1108   BILITOT 0.9 08/23/2010 1029       RADIOGRAPHIC STUDIES: Ct Chest W Contrast  08/25/2014   CLINICAL DATA:  Followup lung cancer  EXAM: CT CHEST WITH CONTRAST  TECHNIQUE: Multidetector CT imaging of the chest was performed during intravenous contrast administration.  CONTRAST:  64mL OMNIPAQUE IOHEXOL 300 MG/ML  SOLN  COMPARISON:  05/24/2014  FINDINGS: Mediastinum: Heart size appears normal. There is no pericardial effusion identified. No mediastinal or hilar adenopathy identified.  Lungs/Pleura: No pleural effusions identified. Previously described pulmonary nodules in the left lower lobe have all increased in the interval. Index left lower lobe nodule measures 1.2 cm, image 47/ series 6. Previously 3 mm. Index 11 mm pulmonary nodule is noted, image 47/ series 6. Previously 4 mm. Index 9  mm nodule is identified, image 51/series 6. Previously 5 mm. Left upper lobe pulmonary nodule measures 4 mm, image 30/ series 6. Previously this measured 2 mm. Persistent ground-glass attenuating nodule in the left upper lobe measures 1.6 cm, image 14/ series 6. Previously 1.2 cm. There is a new nodule within the right upper lobe which measures 5 mm, image 32/series 6. Persistent 1.2 cm ground-glass attenuating nodule which now measures 1.2 cm  Upper Abdomen: The adrenal glands are both normal. The spleen is diffusely abnormal with several large areas of hypoperfusion consistent with infarcts the patient is status post cholecystectomy.  Musculoskeletal: Review of the visualized osseous structures is unremarkable. There are no aggressive lytic or sclerotic bone lesions.  IMPRESSION: 1. Increase in size of previously noted pulmonary nodules compatible with progression of disease. 2. Multiple new splenic infarcts.   Electronically Signed   By: Kerby Moors M.D.   On: 08/25/2014 14:34   ASSESSMENT AND PLAN: This is a very pleasant 80 years old white male with history of  stage IB non-small cell lung cancer diagnosed in November of 2006 as well as a stage IA non-small cell lung cancer diagnosed in February of 2007 status post right upper lobectomy followed by left upper lobe wedge resection.  Unfortunately the recent CT scan of the chest showed further increase in the size of the previously noted pulmonary nodules highly suspicious for progressive disease. The patient also developed new splenic infarcts. I had a lengthy discussion with the patient today about his condition and I showed him the images of the scan. I recommended for him to have a PET scan as well as MRI of the brain performed to complete the staging workup for his disease. I would consider the patient for repeat biopsy of one of the pulmonary lesions after the PET scan before discussion of further treatment options. His leukocytosis has no clear  etiology but it could be related to the new findings with splenic infarcts. The patient would come back for follow-up visit in 2 weeks for reevaluation and more detailed discussion of his treatment options. He was also advised to call immediately if he has any concerning symptoms in the interval. All questions were answered. The patient knows to call the clinic with any problems, questions or concerns. We can certainly see the patient much sooner if necessary.  Disclaimer: This note was dictated with voice recognition software. Similar sounding words can inadvertently be transcribed and may not be corrected upon review.

## 2014-09-05 ENCOUNTER — Telehealth: Payer: Self-pay | Admitting: *Deleted

## 2014-09-05 NOTE — Telephone Encounter (Signed)
Pt called wanting to make to sure Dr Vista Mink knew he had radioactive seeds in his chest from 2006.  Per Dr Vista Mink, he is aware.  Informed patient.

## 2014-09-07 ENCOUNTER — Encounter (HOSPITAL_COMMUNITY): Payer: Self-pay

## 2014-09-07 ENCOUNTER — Ambulatory Visit (HOSPITAL_COMMUNITY)
Admission: RE | Admit: 2014-09-07 | Discharge: 2014-09-07 | Disposition: A | Discharge status: Home / Self Care | Payer: Medicare Other | Source: Ambulatory Visit | Attending: Internal Medicine | Admitting: Internal Medicine

## 2014-09-07 ENCOUNTER — Ambulatory Visit (HOSPITAL_BASED_OUTPATIENT_CLINIC_OR_DEPARTMENT_OTHER): Payer: Medicare Other | Admitting: Lab

## 2014-09-07 DIAGNOSIS — C3410 Malignant neoplasm of upper lobe, unspecified bronchus or lung: Secondary | ICD-10-CM

## 2014-09-07 DIAGNOSIS — Z79899 Other long term (current) drug therapy: Secondary | ICD-10-CM | POA: Insufficient documentation

## 2014-09-07 DIAGNOSIS — Z85118 Personal history of other malignant neoplasm of bronchus and lung: Secondary | ICD-10-CM

## 2014-09-07 DIAGNOSIS — C3412 Malignant neoplasm of upper lobe, left bronchus or lung: Secondary | ICD-10-CM | POA: Insufficient documentation

## 2014-09-07 LAB — COMPREHENSIVE METABOLIC PANEL (CC13)
ALBUMIN: 3.7 g/dL (ref 3.5–5.0)
ALK PHOS: 108 U/L (ref 40–150)
ALT: 17 U/L (ref 0–55)
AST: 20 U/L (ref 5–34)
Anion Gap: 11 mEq/L (ref 3–11)
BUN: 12 mg/dL (ref 7.0–26.0)
CALCIUM: 9.7 mg/dL (ref 8.4–10.4)
CHLORIDE: 102 meq/L (ref 98–109)
CO2: 30 mEq/L — ABNORMAL HIGH (ref 22–29)
CREATININE: 0.9 mg/dL (ref 0.7–1.3)
EGFR: 80 mL/min/{1.73_m2} — ABNORMAL LOW (ref >90)
Glucose: 112 mg/dl (ref 70–140)
Potassium: 4.4 mEq/L (ref 3.5–5.1)
Sodium: 142 mEq/L (ref 136–145)
Total Bilirubin: 0.82 mg/dL (ref 0.20–1.20)
Total Protein: 6.9 g/dL (ref 6.4–8.3)

## 2014-09-07 LAB — GLUCOSE, CAPILLARY: Glucose-Capillary: 113 mg/dL — ABNORMAL HIGH (ref 70–99)

## 2014-09-07 MED ORDER — FLUDEOXYGLUCOSE F - 18 (FDG) INJECTION
9.4000 | Freq: Once | INTRAVENOUS | Status: AC | PRN
  Administered 2014-09-07: 9.4 via INTRAVENOUS

## 2014-09-08 ENCOUNTER — Other Ambulatory Visit: Payer: Self-pay | Admitting: Medical Oncology

## 2014-09-08 ENCOUNTER — Ambulatory Visit (HOSPITAL_COMMUNITY): Payer: Medicare Other

## 2014-09-08 ENCOUNTER — Other Ambulatory Visit: Payer: Medicare Other

## 2014-09-08 DIAGNOSIS — C3492 Malignant neoplasm of unspecified part of left bronchus or lung: Secondary | ICD-10-CM

## 2014-09-15 ENCOUNTER — Telehealth: Payer: Self-pay | Admitting: Internal Medicine

## 2014-09-15 ENCOUNTER — Encounter: Payer: Self-pay | Admitting: Internal Medicine

## 2014-09-15 ENCOUNTER — Ambulatory Visit (HOSPITAL_BASED_OUTPATIENT_CLINIC_OR_DEPARTMENT_OTHER): Payer: Medicare Other | Admitting: Internal Medicine

## 2014-09-15 VITALS — BP 147/70 | HR 94 | Temp 98.1°F | Resp 18 | Ht 69.5 in | Wt 172.2 lb

## 2014-09-15 DIAGNOSIS — Z85118 Personal history of other malignant neoplasm of bronchus and lung: Secondary | ICD-10-CM

## 2014-09-15 DIAGNOSIS — R221 Localized swelling, mass and lump, neck: Secondary | ICD-10-CM

## 2014-09-15 DIAGNOSIS — C3492 Malignant neoplasm of unspecified part of left bronchus or lung: Secondary | ICD-10-CM

## 2014-09-15 DIAGNOSIS — D735 Infarction of spleen: Secondary | ICD-10-CM

## 2014-09-15 DIAGNOSIS — C760 Malignant neoplasm of head, face and neck: Secondary | ICD-10-CM

## 2014-09-15 DIAGNOSIS — R918 Other nonspecific abnormal finding of lung field: Secondary | ICD-10-CM

## 2014-09-15 NOTE — Telephone Encounter (Signed)
gv andprinted appt sched and avs for pt for Jan 2016.Marland KitchenMarland KitchenMarland KitchenPt sched for 1.15.16 3:10pm with rosen

## 2014-09-15 NOTE — Progress Notes (Signed)
Kyle Bauer [phone]   [phone]  OFFICE PROGRESS NOTE  Kyle Downing, MD [place] [address]  DIAGNOSIS:  1. Stage IB (T2N0M0) right upper lobe non-small cell lung cancer consistent with adenocarcinoma with bronchoalveolar features, diagnosed in November 2006. 2. 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  PRIOR THERAPY:  1. Status post right upper lobectomy in November 2006. 2. Status post left upper lobe wedge resection with seed implants on November 07, 2005, under the care of Dr Arlyce Dice.  CURRENT THERAPY: Observation.  INTERVAL HISTORY: Kyle Bauer 80 y.o. male returns to the clinic today for annual followup visit accompanied by his daughter. He has some hoarseness of his voice as well as a swelling on the right side of the neck started several weeks ago but he did not pay attention to wait until was seen on the last exam. The patient is doing fine except for shortness of breath with exertion. He denied having any significant chest pain, cough or hemoptysis. He denied having any significant weight loss or night sweats. He denied having any significant nausea or vomiting. His most recent CT scan of the chest showed findings suspicious for disease recurrence in the lung. The patient underwent a PET scan recently and he is here for evaluation and discussion of his PET scan results and treatment options.  MEDICAL HISTORY: Past Medical History  Diagnosis Date  . Rheumatic fever age 24  . Allergic rhinitis   . Strabismus   . Cancer     lung ca dx'd 2006    ALLERGIES:  has No Known Allergies.  MEDICATIONS:  Current Outpatient Prescriptions  Medication Sig Dispense Refill  . amoxicillin (AMOXIL) 500 MG capsule Take 500 mg by mouth as directed.    .  Aspirin-Acetaminophen (GOODYS BODY PAIN PO) Take by mouth as needed.    . fexofenadine (ALLEGRA) 180 MG tablet Take 180 mg by mouth daily.    Marland Kitchen ibuprofen (ADVIL,MOTRIN) 200 MG tablet Take 200 mg by mouth every 6 (six) hours as needed.    Marland Kitchen omeprazole (PRILOSEC) 40 MG capsule Take 40 mg by mouth daily.    Marland Kitchen UNABLE TO FIND Weekly allergy shots    . vitamin C (ASCORBIC ACID) 500 MG tablet Take 500 mg by mouth daily.     No current facility-administered medications for this visit.    SURGICAL HISTORY:  Past Surgical History  Procedure Laterality Date  . Tonsillectomy    . Cholecystectomy    . Right elbow surgery    . Appendectomy    . Total hip arthroplasty      Left  . Colon resection x 2      for perforated colon-colostomy x 6 months  . Lobectomy      RUL-for adenoca T2,N0,MX  . Lul wedge resection      for adenoca with radioactive seeds  . Bilatteral vat's lung resections    . Nasal fracture surgery    . Right arm      REVIEW OF SYSTEMS:  Constitutional: negative Eyes: negative Ears, nose, mouth, throat, and face: negative Respiratory: positive for dyspnea on exertion Cardiovascular: negative Gastrointestinal: negative Genitourinary:negative Integument/breast: negative Hematologic/lymphatic: negative Musculoskeletal:negative Neurological: negative Behavioral/Psych: negative Endocrine: negative Allergic/Immunologic: negative   PHYSICAL EXAMINATION: General appearance: alert, cooperative and no distress Head: Normocephalic, without obvious abnormality, atraumatic Neck: Large right cervical  lymph node nontender to palpation in at least 5 cm in size. Lymph nodes: Cervical adenopathy: Large right cervical lymph node nontender to palpation in at least 5 cm in size. Resp: clear to auscultation bilaterally Back: symmetric, no curvature. ROM normal. No CVA tenderness. Cardio: regular rate and rhythm, S1, S2 normal, no murmur, click, rub or gallop GI: soft, non-tender; bowel  sounds normal; no masses,  no organomegaly Extremities: extremities normal, atraumatic, no cyanosis or edema Neurologic: Alert and oriented X 3, normal strength and tone. Normal symmetric reflexes. Normal coordination and gait  ECOG PERFORMANCE STATUS: 1 - Symptomatic but completely ambulatory  Blood pressure 147/70, pulse 94, temperature 98.1 F (36.7 C), temperature source Oral, resp. rate 18, height 5' 9.5" (1.765 m), weight 172 lb 3.2 oz (78.109 kg).  LABORATORY DATA: Lab Results  Component Value Date   WBC 19.9* 08/25/2014   HGB 16.8 08/25/2014   HCT 49.0 08/25/2014   MCV 95.3 08/25/2014   PLT 440* 08/25/2014      Chemistry      Component Value Date/Time   NA 142 09/07/2014 0812   NA 142 08/26/2011 1108   NA 139 08/23/2010 1029   K 4.4 09/07/2014 0812   K 4.3 08/26/2011 1108   K 4.3 08/23/2010 1029   CL 101 08/24/2012 0954   CL 102 08/26/2011 1108   CL 102 08/23/2010 1029   CO2 30* 09/07/2014 0812   CO2 27 08/26/2011 1108   CO2 29 08/23/2010 1029   BUN 12.0 09/07/2014 0812   BUN 14 08/26/2011 1108   BUN 12 08/23/2010 1029   CREATININE 0.9 09/07/2014 0812   CREATININE 0.9 08/26/2011 1108   CREATININE 1.07 08/23/2010 1029      Component Value Date/Time   CALCIUM 9.7 09/07/2014 0812   CALCIUM 9.2 08/26/2011 1108   CALCIUM 9.3 08/23/2010 1029   ALKPHOS 108 09/07/2014 0812   ALKPHOS 87* 08/26/2011 1108   ALKPHOS 83 08/23/2010 1029   AST 20 09/07/2014 0812   AST 31 08/26/2011 1108   AST 32 08/23/2010 1029   ALT 17 09/07/2014 0812   ALT 25 08/26/2011 1108   ALT 26 08/23/2010 1029   BILITOT 0.82 09/07/2014 0812   BILITOT 1.20 08/26/2011 1108   BILITOT 0.9 08/23/2010 1029       RADIOGRAPHIC STUDIES: Ct Chest W Contrast  08/25/2014   CLINICAL DATA:  Followup lung cancer  EXAM: CT CHEST WITH CONTRAST  TECHNIQUE: Multidetector CT imaging of the chest was performed during intravenous contrast administration.  CONTRAST:  41mL OMNIPAQUE IOHEXOL 300 MG/ML  SOLN   COMPARISON:  05/24/2014  FINDINGS: Mediastinum: Heart size appears normal. There is no pericardial effusion identified. No mediastinal or hilar adenopathy identified.  Lungs/Pleura: No pleural effusions identified. Previously described pulmonary nodules in the left lower lobe have all increased in the interval. Index left lower lobe nodule measures 1.2 cm, image 47/ series 6. Previously 3 mm. Index 11 mm pulmonary nodule is noted, image 47/ series 6. Previously 4 mm. Index 9 mm nodule is identified, image 51/series 6. Previously 5 mm. Left upper lobe pulmonary nodule measures 4 mm, image 30/ series 6. Previously this measured 2 mm. Persistent ground-glass attenuating nodule in the left upper lobe measures 1.6 cm, image 14/ series 6. Previously 1.2 cm. There is a new nodule within the right upper lobe which measures 5 mm, image 32/series 6. Persistent 1.2 cm ground-glass attenuating nodule which now measures 1.2 cm  Upper Abdomen: The adrenal glands are both normal.  The spleen is diffusely abnormal with several large areas of hypoperfusion consistent with infarcts the patient is status post cholecystectomy.  Musculoskeletal: Review of the visualized osseous structures is unremarkable. There are no aggressive lytic or sclerotic bone lesions.  IMPRESSION: 1. Increase in size of previously noted pulmonary nodules compatible with progression of disease. 2. Multiple new splenic infarcts.   Electronically Signed   By: Kerby Moors M.D.   On: 08/25/2014 14:34   Nm Pet Image Initial (pi) Skull Base To Thigh  09/07/2014   CLINICAL DATA:  Subsequent treatment strategy for lung cancer.  EXAM: NUCLEAR MEDICINE PET SKULL BASE TO THIGH  TECHNIQUE: 9.4 mCi F-18 FDG was injected intravenously. Full-ring PET imaging was performed from the skull base to thigh after the radiotracer. CT data was obtained and used for attenuation correction and anatomic localization.  FASTING BLOOD GLUCOSE:  Value: 113 mg/dl  COMPARISON:  CT  08/25/2014, PET-CT 06/27/2005  FINDINGS: NECK  There is a hypermetabolic mass involving the right vallecula and extending inferiorly to involve tissue posterior to the arytenoid cartilage at the level glottis. Hypermetabolic mass crosses midline posterior to the arytenoid cartilage and is inseparable from the proximal esophagus. The mass is intensely hypermetabolic with SUV max = 27. Mass at the level of the right vallecula measures 2.5 x 3.5 cm . Posterior to the arytenoid cartilage mass measures 3.2 x 2.5 cm.  There is an large hypermetabolic necrotic lymph node in the right level IIa position measuring 3.1 cm with SUV max 11.9. Smaller hypermetabolic level 2 lymph nodes on the right.  CHEST  Within the left lower lobe there 5 rounded pulmonary nodules measuring 10 to 13 mm. These nodules have associated metabolic activity with SUV max equal 4-5. Single hypermetabolic nodule in the right upper lobe on image 19, series 3 measures 6 mm. There is postsurgical change in the left upper lobe with brachytherapy seeds. No significant metabolic activity within the surgical / treatment site left upper lobe.  ABDOMEN/PELVIS  No abnormal hypermetabolic activity within the liver, pancreas, adrenal glands, or spleen. No hypermetabolic lymph nodes in the abdomen or pelvis.  SKELETON  No focal hypermetabolic activity to suggest skeletal metastasis.  IMPRESSION: 1. Dominant finding is a large hypermetabolic mass involving the right vallecula and extending posterior to the arytenoid cartilage to potentially involve the proximal esophagus. Concern for primary head and neck cancer. Recommend direct visual evaluation and consider CT of the neck with contrast for more detailed anatomy.  2. Hypermetabolic necrotic right level II lymph nodes consistent with local nodal metastasis.  3. Five hypermetabolic nodules within the left lower lobe with differential including metastatic disease versus lung cancer recurrence.  4. Single  hypermetabolic right upper lobe nodule is concerning for metastasis.  Findings conveyed toMOHAMED Hailea Eaglin on 09/07/2014  at11:22.   Electronically Signed   By: Suzy Bouchard M.D.   On: 09/07/2014 11:22   ASSESSMENT AND PLAN: This is a very pleasant 80 years old white male with history of stage IB non-small cell lung cancer diagnosed in November of 2006 as well as a stage IA non-small cell lung cancer diagnosed in February of 2007 status post right upper lobectomy followed by left upper lobe wedge resection.  Unfortunately the recent CT scan of the chest showed further increase in the size of the previously noted pulmonary nodules highly suspicious for progressive disease. The patient also developed new splenic infarcts. The recent PET scan showed also evidence for large hypermetabolic mass in the right vallecula  extending posterior to the arytenoid cartilage and potentially involving the proximal esophagus. There was also large hypermetabolic necrotic right level II lymph node consistent with local nodal metastases in addition to 5 hypermetabolic nodules in the lung suspicious for metastatic disease from head and neck versus lung cancer recurrence. I had a lengthy discussion with the patient and his daughter today about his scan results and showed them the images. I recommended for the patient to see Dr. Constance Holster, his ENT physician for evaluation and consideration of biopsy of the right vallecula mass. I will see the patient back for follow-up visit in 2 weeks for reevaluation and discussion of his treatment options based on the final pathology. He was also advised to call immediately if he has any concerning symptoms in the interval. All questions were answered. The patient knows to call the clinic with any problems, questions or concerns. We can certainly see the patient much sooner if necessary. I spent 15 minutes of face-to-face counseling with the patient and his daughter out of the total visit time  25 minutes.  Disclaimer: This note was dictated with voice recognition software. Similar sounding words can inadvertently be transcribed and may not be corrected upon review.

## 2014-09-21 ENCOUNTER — Other Ambulatory Visit (HOSPITAL_COMMUNITY)
Admission: RE | Admit: 2014-09-21 | Discharge: 2014-09-21 | Disposition: A | Discharge status: Home / Self Care | Payer: Medicare Other | Source: Ambulatory Visit | Attending: Otolaryngology | Admitting: Otolaryngology

## 2014-09-21 DIAGNOSIS — C779 Secondary and unspecified malignant neoplasm of lymph node, unspecified: Secondary | ICD-10-CM | POA: Insufficient documentation

## 2014-09-22 ENCOUNTER — Other Ambulatory Visit: Payer: Self-pay | Admitting: Otolaryngology

## 2014-09-22 ENCOUNTER — Ambulatory Visit (HOSPITAL_COMMUNITY)
Admission: RE | Admit: 2014-09-22 | Discharge: 2014-09-22 | Disposition: A | Discharge status: Home / Self Care | Payer: Medicare Other | Source: Ambulatory Visit | Attending: Internal Medicine | Admitting: Internal Medicine

## 2014-09-22 DIAGNOSIS — G319 Degenerative disease of nervous system, unspecified: Secondary | ICD-10-CM | POA: Insufficient documentation

## 2014-09-22 DIAGNOSIS — I6782 Cerebral ischemia: Secondary | ICD-10-CM | POA: Insufficient documentation

## 2014-09-22 DIAGNOSIS — C3412 Malignant neoplasm of upper lobe, left bronchus or lung: Secondary | ICD-10-CM

## 2014-09-22 DIAGNOSIS — C3492 Malignant neoplasm of unspecified part of left bronchus or lung: Secondary | ICD-10-CM | POA: Diagnosis not present

## 2014-09-22 DIAGNOSIS — I639 Cerebral infarction, unspecified: Secondary | ICD-10-CM | POA: Diagnosis not present

## 2014-09-22 MED ORDER — GADOBENATE DIMEGLUMINE 529 MG/ML IV SOLN
20.0000 mL | Freq: Once | INTRAVENOUS | Status: AC | PRN
  Administered 2014-09-22: 16 mL via INTRAVENOUS

## 2014-09-29 ENCOUNTER — Other Ambulatory Visit (HOSPITAL_BASED_OUTPATIENT_CLINIC_OR_DEPARTMENT_OTHER): Payer: Medicare Other

## 2014-09-29 DIAGNOSIS — C3492 Malignant neoplasm of unspecified part of left bronchus or lung: Secondary | ICD-10-CM

## 2014-09-29 DIAGNOSIS — Z85118 Personal history of other malignant neoplasm of bronchus and lung: Secondary | ICD-10-CM

## 2014-09-29 DIAGNOSIS — C760 Malignant neoplasm of head, face and neck: Secondary | ICD-10-CM

## 2014-09-29 LAB — COMPREHENSIVE METABOLIC PANEL (CC13)
ALT: 12 U/L (ref 0–55)
AST: 19 U/L (ref 5–34)
Albumin: 3 g/dL — ABNORMAL LOW (ref 3.5–5.0)
Alkaline Phosphatase: 127 U/L (ref 40–150)
Anion Gap: 9 mEq/L (ref 3–11)
BUN: 21.3 mg/dL (ref 7.0–26.0)
CO2: 29 mEq/L (ref 22–29)
CREATININE: 0.8 mg/dL (ref 0.7–1.3)
Calcium: 10 mg/dL (ref 8.4–10.4)
Chloride: 97 mEq/L — ABNORMAL LOW (ref 98–109)
EGFR: 86 mL/min/{1.73_m2} — AB (ref >90)
Glucose: 138 mg/dl (ref 70–140)
Potassium: 4.1 mEq/L (ref 3.5–5.1)
Sodium: 135 mEq/L — ABNORMAL LOW (ref 136–145)
Total Bilirubin: 0.9 mg/dL (ref 0.20–1.20)
Total Protein: 7.1 g/dL (ref 6.4–8.3)

## 2014-09-29 LAB — CBC WITH DIFFERENTIAL/PLATELET
BASO%: 0.2 % (ref 0.0–2.0)
Basophils Absolute: 0 10*3/uL (ref 0.0–0.1)
EOS%: 0.2 % (ref 0.0–7.0)
Eosinophils Absolute: 0 10*3/uL (ref 0.0–0.5)
HCT: 44.2 % (ref 38.4–49.9)
HGB: 15.3 g/dL (ref 13.0–17.1)
LYMPH%: 5.4 % — ABNORMAL LOW (ref 14.0–49.0)
MCH: 32.6 pg (ref 27.2–33.4)
MCHC: 34.6 g/dL (ref 32.0–36.0)
MCV: 94 fL (ref 79.3–98.0)
MONO#: 2.3 10*3/uL — ABNORMAL HIGH (ref 0.1–0.9)
MONO%: 12.2 % (ref 0.0–14.0)
NEUT#: 15.7 10*3/uL — ABNORMAL HIGH (ref 1.5–6.5)
NEUT%: 82 % — ABNORMAL HIGH (ref 39.0–75.0)
Platelets: 433 10*3/uL — ABNORMAL HIGH (ref 140–400)
RBC: 4.7 10*6/uL (ref 4.20–5.82)
RDW: 13.3 % (ref 11.0–14.6)
WBC: 19.1 10*3/uL — ABNORMAL HIGH (ref 4.0–10.3)
lymph#: 1 10*3/uL (ref 0.9–3.3)
nRBC: 0 % (ref 0–0)

## 2014-10-01 ENCOUNTER — Inpatient Hospital Stay (HOSPITAL_COMMUNITY)
Admission: EM | Admit: 2014-10-01 | Discharge: 2014-10-11 | DRG: 981 | Disposition: A | Discharge status: Home Health | Payer: Medicare Other | Attending: Family Medicine | Admitting: Family Medicine

## 2014-10-01 ENCOUNTER — Encounter (HOSPITAL_COMMUNITY): Payer: Self-pay | Admitting: Emergency Medicine

## 2014-10-01 ENCOUNTER — Emergency Department (HOSPITAL_COMMUNITY): Payer: Medicare Other

## 2014-10-01 DIAGNOSIS — Z85118 Personal history of other malignant neoplasm of bronchus and lung: Secondary | ICD-10-CM

## 2014-10-01 DIAGNOSIS — Z923 Personal history of irradiation: Secondary | ICD-10-CM

## 2014-10-01 DIAGNOSIS — Z6822 Body mass index (BMI) 22.0-22.9, adult: Secondary | ICD-10-CM

## 2014-10-01 DIAGNOSIS — E860 Dehydration: Secondary | ICD-10-CM | POA: Diagnosis present

## 2014-10-01 DIAGNOSIS — C760 Malignant neoplasm of head, face and neck: Secondary | ICD-10-CM | POA: Diagnosis present

## 2014-10-01 DIAGNOSIS — F1010 Alcohol abuse, uncomplicated: Secondary | ICD-10-CM | POA: Diagnosis present

## 2014-10-01 DIAGNOSIS — D630 Anemia in neoplastic disease: Secondary | ICD-10-CM | POA: Diagnosis present

## 2014-10-01 DIAGNOSIS — C7800 Secondary malignant neoplasm of unspecified lung: Secondary | ICD-10-CM | POA: Diagnosis present

## 2014-10-01 DIAGNOSIS — D72829 Elevated white blood cell count, unspecified: Secondary | ICD-10-CM | POA: Diagnosis present

## 2014-10-01 DIAGNOSIS — Z902 Acquired absence of lung [part of]: Secondary | ICD-10-CM | POA: Diagnosis present

## 2014-10-01 DIAGNOSIS — R509 Fever, unspecified: Secondary | ICD-10-CM | POA: Diagnosis present

## 2014-10-01 DIAGNOSIS — R918 Other nonspecific abnormal finding of lung field: Secondary | ICD-10-CM | POA: Diagnosis present

## 2014-10-01 DIAGNOSIS — C779 Secondary and unspecified malignant neoplasm of lymph node, unspecified: Secondary | ICD-10-CM | POA: Diagnosis present

## 2014-10-01 DIAGNOSIS — R1310 Dysphagia, unspecified: Secondary | ICD-10-CM | POA: Insufficient documentation

## 2014-10-01 DIAGNOSIS — K219 Gastro-esophageal reflux disease without esophagitis: Secondary | ICD-10-CM | POA: Diagnosis present

## 2014-10-01 DIAGNOSIS — C801 Malignant (primary) neoplasm, unspecified: Secondary | ICD-10-CM

## 2014-10-01 DIAGNOSIS — J189 Pneumonia, unspecified organism: Secondary | ICD-10-CM

## 2014-10-01 DIAGNOSIS — R1319 Other dysphagia: Secondary | ICD-10-CM | POA: Diagnosis present

## 2014-10-01 DIAGNOSIS — C139 Malignant neoplasm of hypopharynx, unspecified: Secondary | ICD-10-CM | POA: Diagnosis present

## 2014-10-01 DIAGNOSIS — E162 Hypoglycemia, unspecified: Secondary | ICD-10-CM | POA: Diagnosis present

## 2014-10-01 DIAGNOSIS — Z87891 Personal history of nicotine dependence: Secondary | ICD-10-CM

## 2014-10-01 DIAGNOSIS — I472 Ventricular tachycardia: Secondary | ICD-10-CM | POA: Diagnosis present

## 2014-10-01 DIAGNOSIS — J449 Chronic obstructive pulmonary disease, unspecified: Secondary | ICD-10-CM | POA: Diagnosis present

## 2014-10-01 DIAGNOSIS — T17908A Unspecified foreign body in respiratory tract, part unspecified causing other injury, initial encounter: Secondary | ICD-10-CM

## 2014-10-01 DIAGNOSIS — E43 Unspecified severe protein-calorie malnutrition: Secondary | ICD-10-CM | POA: Insufficient documentation

## 2014-10-01 DIAGNOSIS — Z9049 Acquired absence of other specified parts of digestive tract: Secondary | ICD-10-CM | POA: Diagnosis present

## 2014-10-01 DIAGNOSIS — K222 Esophageal obstruction: Secondary | ICD-10-CM | POA: Diagnosis present

## 2014-10-01 DIAGNOSIS — R634 Abnormal weight loss: Secondary | ICD-10-CM | POA: Diagnosis present

## 2014-10-01 LAB — COMPREHENSIVE METABOLIC PANEL
ALT: 21 U/L (ref 0–53)
ANION GAP: 8 (ref 5–15)
AST: 27 U/L (ref 0–37)
Albumin: 3.3 g/dL — ABNORMAL LOW (ref 3.5–5.2)
Alkaline Phosphatase: 145 U/L — ABNORMAL HIGH (ref 39–117)
BUN: 25 mg/dL — AB (ref 6–23)
CO2: 29 mmol/L (ref 19–32)
CREATININE: 0.68 mg/dL (ref 0.50–1.35)
Calcium: 9.4 mg/dL (ref 8.4–10.5)
Chloride: 100 mEq/L (ref 96–112)
GFR calc Af Amer: >90 mL/min (ref >90)
GFR calc non Af Amer: 87 mL/min — ABNORMAL LOW (ref >90)
Glucose, Bld: 140 mg/dL — ABNORMAL HIGH (ref 70–99)
POTASSIUM: 3.8 mmol/L (ref 3.5–5.1)
Sodium: 137 mmol/L (ref 135–145)
Total Bilirubin: 1.1 mg/dL (ref 0.3–1.2)
Total Protein: 7.1 g/dL (ref 6.0–8.3)

## 2014-10-01 LAB — CBC WITH DIFFERENTIAL/PLATELET
BASOS PCT: 0 % (ref 0–1)
Basophils Absolute: 0.1 10*3/uL (ref 0.0–0.1)
EOS ABS: 0 10*3/uL (ref 0.0–0.7)
EOS PCT: 0 % (ref 0–5)
HEMATOCRIT: 43.9 % (ref 39.0–52.0)
Hemoglobin: 14.9 g/dL (ref 13.0–17.0)
LYMPHS PCT: 9 % — AB (ref 12–46)
Lymphs Abs: 1.8 10*3/uL (ref 0.7–4.0)
MCH: 32.7 pg (ref 26.0–34.0)
MCHC: 33.9 g/dL (ref 30.0–36.0)
MCV: 96.5 fL (ref 78.0–100.0)
Monocytes Absolute: 1.2 10*3/uL — ABNORMAL HIGH (ref 0.1–1.0)
Monocytes Relative: 6 % (ref 3–12)
NEUTROS PCT: 85 % — AB (ref 43–77)
Neutro Abs: 16.9 10*3/uL — ABNORMAL HIGH (ref 1.7–7.7)
Platelets: 471 10*3/uL — ABNORMAL HIGH (ref 150–400)
RBC: 4.55 MIL/uL (ref 4.22–5.81)
RDW: 13.3 % (ref 11.5–15.5)
WBC: 20 10*3/uL — ABNORMAL HIGH (ref 4.0–10.5)

## 2014-10-01 MED ORDER — ACETAMINOPHEN 650 MG RE SUPP
650.0000 mg | RECTAL | Status: DC | PRN
  Administered 2014-10-01: 650 mg via RECTAL

## 2014-10-01 MED ORDER — THIAMINE HCL 100 MG/ML IJ SOLN
100.0000 mg | Freq: Every day | INTRAMUSCULAR | Status: DC
  Administered 2014-10-02 – 2014-10-11 (×10): 100 mg via INTRAVENOUS
  Filled 2014-10-01 (×11): qty 1

## 2014-10-01 MED ORDER — SODIUM CHLORIDE 0.9 % IV SOLN
INTRAVENOUS | Status: DC
  Administered 2014-10-01 – 2014-10-03 (×3): via INTRAVENOUS

## 2014-10-01 MED ORDER — IOHEXOL 300 MG/ML  SOLN
100.0000 mL | Freq: Once | INTRAMUSCULAR | Status: AC | PRN
  Administered 2014-10-01: 100 mL via INTRAVENOUS

## 2014-10-01 MED ORDER — MORPHINE SULFATE 4 MG/ML IJ SOLN
4.0000 mg | Freq: Once | INTRAMUSCULAR | Status: AC
  Administered 2014-10-01: 4 mg via INTRAVENOUS
  Filled 2014-10-01: qty 1

## 2014-10-01 MED ORDER — SODIUM CHLORIDE 0.9 % IJ SOLN
3.0000 mL | Freq: Two times a day (BID) | INTRAMUSCULAR | Status: DC
  Administered 2014-10-07 – 2014-10-11 (×4): 3 mL via INTRAVENOUS

## 2014-10-01 MED ORDER — ALBUTEROL SULFATE (2.5 MG/3ML) 0.083% IN NEBU: 2.5000 mg | INHALATION_SOLUTION | RESPIRATORY_TRACT | Status: DC | PRN

## 2014-10-01 MED ORDER — PIPERACILLIN-TAZOBACTAM 3.375 G IVPB
3.3750 g | Freq: Three times a day (TID) | INTRAVENOUS | Status: DC
  Administered 2014-10-01 – 2014-10-06 (×15): 3.375 g via INTRAVENOUS
  Filled 2014-10-01 (×16): qty 50

## 2014-10-01 MED ORDER — ENOXAPARIN SODIUM 40 MG/0.4ML ~~LOC~~ SOLN
40.0000 mg | SUBCUTANEOUS | Status: DC
  Administered 2014-10-01 – 2014-10-10 (×10): 40 mg via SUBCUTANEOUS
  Filled 2014-10-01 (×11): qty 0.4

## 2014-10-01 MED ORDER — SODIUM CHLORIDE 0.9 % IV BOLUS (SEPSIS)
1000.0000 mL | Freq: Once | INTRAVENOUS | Status: AC
  Administered 2014-10-01: 1000 mL via INTRAVENOUS

## 2014-10-01 MED ORDER — LORAZEPAM 2 MG/ML IJ SOLN
1.0000 mg | Freq: Four times a day (QID) | INTRAMUSCULAR | Status: AC | PRN
  Administered 2014-10-02 – 2014-10-03 (×2): 1 mg via INTRAVENOUS
  Filled 2014-10-01 (×2): qty 1

## 2014-10-01 MED ORDER — MORPHINE SULFATE 2 MG/ML IJ SOLN
2.0000 mg | INTRAMUSCULAR | Status: DC | PRN
  Administered 2014-10-01 – 2014-10-02 (×4): 4 mg via INTRAVENOUS
  Filled 2014-10-01 (×4): qty 2

## 2014-10-01 MED ORDER — ONDANSETRON HCL 4 MG/2ML IJ SOLN
4.0000 mg | Freq: Four times a day (QID) | INTRAMUSCULAR | Status: DC | PRN
  Administered 2014-10-07: 4 mg via INTRAVENOUS
  Filled 2014-10-01: qty 2

## 2014-10-01 MED ORDER — MORPHINE SULFATE 2 MG/ML IJ SOLN
0.5000 mg | INTRAMUSCULAR | Status: DC | PRN
  Administered 2014-10-01: 1 mg via INTRAVENOUS
  Filled 2014-10-01: qty 1

## 2014-10-01 NOTE — ED Notes (Signed)
Patient transported to X-ray 

## 2014-10-01 NOTE — ED Notes (Signed)
Bed: BU03 Expected date:  Expected time:  Means of arrival:  Comments: EMS short of breath

## 2014-10-01 NOTE — H&P (Addendum)
[facility] History and Physical  Kyle Bauer [facility]:[id number] DOB: September 19, 1930 DOA: 10/01/2014   PCP: Kaleen Mask, MD  Specialists: Dr. Arbutus Ped is his oncologist for his history of lung cancer. Dr. Pollyann Kennedy is his ENT specialist. Dr. Maple Hudson manages his allergies.  Chief Complaint: Difficulty swallowing  HPI: Kyle Bauer is a 81 y.o. male with a past medical history of lung cancer for which he underwent surgery and radiation treatment in 2006 and 2007, history of allergies, acid reflux disease who was recently diagnosed with head and neck cancer, who presents today with difficulty swallowing liquids. He tells me that he's had some difficulty swallowing for the past few months. According to the daughters, who accompany him, initially he was able to swallow solids, but then he had difficulty doing that and so for the past 1-2 months he's been having a liquid/semisolid diet. He's lost 20 pounds as a result of the same. He was found to have progression of his lung nodules in early December. He underwent PET scan which revealed a mass in his hypopharynx. Subsequently was referred to ENT and had a biopsy done on January 6. The pathology shows squamous cell carcinoma. Over the last few days he's had some difficulty swallowing liquids and this morning he started choking on his liquids. He had some difficulty clearing secretions and so he was asked to come into the hospital. He has some pain in the neck area. Denies any headaches. No fever, no chills. No nausea, vomiting. He's been constipated recently. Denies any difficulty with urination.  Home Medications: Prior to Admission medications   Medication Sig Start Date End Date Taking? Authorizing Provider  Aspirin-Acetaminophen (GOODYS BODY PAIN PO) Take 1 packet by mouth every 6 (six) hours as needed (for pain).    Yes Historical Provider, MD  fexofenadine (ALLEGRA) 180 MG tablet Take 180 mg by mouth daily.   Yes Historical Provider, MD    HYDROcodone-acetaminophen (NORCO) 7.5-325 MG per tablet Take 1 tablet by mouth 3 (three) times daily as needed for moderate pain.   Yes Historical Provider, MD  ibuprofen (ADVIL,MOTRIN) 200 MG tablet Take 200 mg by mouth every 6 (six) hours as needed for mild pain.    Yes Historical Provider, MD  omeprazole (PRILOSEC) 40 MG capsule Take 40 mg by mouth daily.   Yes Historical Provider, MD  UNABLE TO FIND Weekly allergy shots   Yes Historical Provider, MD  vitamin C (ASCORBIC ACID) 500 MG tablet Take 500 mg by mouth daily.   Yes Historical Provider, MD    Allergies: No Known Allergies  Past Medical History: Past Medical History  Diagnosis Date  . Rheumatic fever age 71  . Allergic rhinitis   . Strabismus   . Cancer     lung ca dx'd 2006    Past Surgical History  Procedure Laterality Date  . Tonsillectomy    . Cholecystectomy    . Right elbow surgery    . Appendectomy    . Total hip arthroplasty      Left  . Colon resection x 2      for perforated colon-colostomy x 6 months  . Lobectomy      RUL-for adenoca T2,N0,MX  . Lul wedge resection      for adenoca with radioactive seeds  . Bilatteral vat's lung resections    . Nasal fracture surgery    . Right arm      Social History: [place]. No current history of  smoking previously a smoked. However, he does drink half a liter of wine along with 1-2 beers almost on a daily basis. Last intake was yesterday. No illicit drug use. Independent with daily activities.  Family History:  Family History  Problem Relation Age of Onset  . Other Father     cardiac arrythmia  . Heart disease Father      Review of Systems - History obtained from the patient General ROS: positive for  - weight loss Psychological ROS: negative Ophthalmic ROS: negative ENT ROS: as in hpi Allergy and Immunology ROS: positive for - seasonal allergies Hematological and Lymphatic ROS: negative Endocrine ROS:  negative Respiratory ROS: occasional cough. no blood in sputum Cardiovascular ROS: no chest pain or dyspnea on exertion Gastrointestinal ROS: no abdominal pain, change in bowel habits, or black or bloody stools Genito-Urinary ROS: no dysuria, trouble voiding, or hematuria Musculoskeletal ROS: negative Neurological ROS: no TIA or stroke symptoms Dermatological ROS: negative  Physical Examination  Filed Vitals:   10/01/14 1051 10/01/14 1109 10/01/14 1300 10/01/14 1358  BP:  134/59 132/62 138/61  Pulse:  100 88 92  Temp:  99.1 F (37.3 C)  98 F (36.7 C)  TempSrc:  Oral  Oral  Resp:  20  20  SpO2: 95% 94% 94% 95%    BP 138/61 mmHg  Pulse 92  Temp(Src) 98 F (36.7 C) (Oral)  Resp 20  SpO2 95%  General appearance: alert, cooperative, appears stated age and no distress Head: Normocephalic, without obvious abnormality, atraumatic Eyes: conjunctivae/corneas clear. PERRL, EOM's intact.  Throat: lips, mucosa, and tongue normal; teeth and gums normal Neck: marked anterior cervical adenopathy, no carotid bruit, no JVD, supple, symmetrical, trachea midline and thyroid not enlarged, symmetric, no tenderness/mass/nodules Resp: Coarse breath sounds bilaterally. No wheezing. No rhonchi. Cardio: regular rate and rhythm, S1, S2 normal, no murmur, click, rub or gallop GI: soft, non-tender; bowel sounds normal; no masses,  no organomegaly Extremities: extremities normal, atraumatic, no cyanosis or edema Pulses: 2+ and symmetric Skin: Skin color, texture, turgor normal. No rashes or lesions Lymph nodes: Cervical adenopathy: Enlarged Right anterior lymph nodes. Neurologic: Alert and oriented X 3, normal strength and tone. No focal deficits  Laboratory Data: Results for orders placed or performed during the hospital encounter of 10/01/14 (from the past 48 hour(s))  CBC with Differential     Status: Abnormal   Collection Time: 10/01/14 12:31 PM  Result Value Ref Range   WBC 20.0 (H) 4.0 -  10.5 K/uL   RBC 4.55 4.22 - 5.81 MIL/uL   Hemoglobin 14.9 13.0 - 17.0 g/dL   HCT 43.9 39.0 - 52.0 %   MCV 96.5 78.0 - 100.0 fL   MCH 32.7 26.0 - 34.0 pg   MCHC 33.9 30.0 - 36.0 g/dL   RDW 13.3 11.5 - 15.5 %   Platelets 471 (H) 150 - 400 K/uL   Neutrophils Relative % 85 (H) 43 - 77 %   Neutro Abs 16.9 (H) 1.7 - 7.7 K/uL   Lymphocytes Relative 9 (L) 12 - 46 %   Lymphs Abs 1.8 0.7 - 4.0 K/uL   Monocytes Relative 6 3 - 12 %   Monocytes Absolute 1.2 (H) 0.1 - 1.0 K/uL   Eosinophils Relative 0 0 - 5 %   Eosinophils Absolute 0.0 0.0 - 0.7 K/uL   Basophils Relative 0 0 - 1 %   Basophils Absolute 0.1 0.0 - 0.1 K/uL  Comprehensive metabolic panel     Status: Abnormal  Collection Time: 10/01/14 12:31 PM  Result Value Ref Range   Sodium 137 135 - 145 mmol/L    Comment: Please note change in reference range.   Potassium 3.8 3.5 - 5.1 mmol/L    Comment: Please note change in reference range.   Chloride 100 96 - 112 mEq/L   CO2 29 19 - 32 mmol/L   Glucose, Bld 140 (H) 70 - 99 mg/dL   BUN 25 (H) 6 - 23 mg/dL   Creatinine, Ser 0.68 0.50 - 1.35 mg/dL   Calcium 9.4 8.4 - 10.5 mg/dL   Total Protein 7.1 6.0 - 8.3 g/dL   Albumin 3.3 (L) 3.5 - 5.2 g/dL   AST 27 0 - 37 U/L   ALT 21 0 - 53 U/L   Alkaline Phosphatase 145 (H) 39 - 117 U/L   Total Bilirubin 1.1 0.3 - 1.2 mg/dL   GFR calc non Af Amer 87 (L) >90 mL/min   GFR calc Af Amer >90 >90 mL/min    Comment: (NOTE) The eGFR has been calculated using the CKD EPI equation. This calculation has not been validated in all clinical situations. eGFR's persistently <90 mL/min signify possible Chronic Kidney Disease.    Anion gap 8 5 - 15    Radiology Reports: Dg Chest 2 View  10/01/2014   CLINICAL DATA:  CXR for aspiration into airway. Pt complains of right sided neck swelling, generalized SOB, coughing. Hx of bilateral lung cancer, strabismus, allergic rhinitis, rheumatic fever. Prior RUL lobectomy, LUL wedge resection w/ radioactive seed,  Bilateral VAT's resections. Former smoker.  EXAM: CHEST  2 VIEW  COMPARISON:  PET-CT dated 09/07/2014.  FINDINGS: Grossly normal-sized heart. Interval small right pleural effusion.a previously demonstrated left lower lobe lung nodules. Left upper chest surgical clips and staples. Cholecystectomy clips. Old, healed left clavicle fracture.  IMPRESSION: 1. Interval small right pleural effusion. 2. Previously noted left lower lobe nodules.   Electronically Signed   By: Enrique Sack M.D.   On: 10/01/2014 14:11     Problem List  Principal Problem:   Squamous cell cancer of hypopharynx Active Problems:   Multiple lung nodules   Head and neck cancer   Mechanical dysphagia   Alcohol abuse   Leukocytosis   Dehydration   Weight loss   Assessment: This is a 81 year old Caucasian male who presents with difficulty swallowing. He has obstruction from his head and neck cancer causing a mechanical dysphagia. He is not drooling his saliva. He is able to clear his secretions for the most part. Plan regarding his recently diagnosed head and neck cancer is not clear yet.  Plan: #1 Mechanical dysphagia: This is secondary to his head and neck cancer. CT scan of his neck is pending to further characterize this mass. Pathology did show squamous cell cancer. He'll be kept nothing by mouth for now. IV fluids will be given.  #2 Squamous cell cancer of head and neck: Will need to discuss with oncology and ENT regarding definitive management. He does have pulmonary nodules, which could be metastatic process from this mass or could be progression of his previously diagnosed lung cancer. Both his oncologist and his ENT doctor are not on call this weekend. We'll need to wait till Monday to determine plan of care. We'll keep him nothing by mouth for now. It appears that this tumor might need radiation. He may not be a candidate for surgery. I did speak with Dr. Wilburn Cornelia who is on-call for Dr. Constance Holster. He states that the  head and  neck cancer conference is usually held on Wednesdays. He will pass on the patient's information to Dr. Constance Holster and more information will be available on Monday.  #3 Dehydration and weight loss: Secondary to dysphagia. He'll be given IV fluids. Strict nothing by mouth for now.  #4 Leukocytosis: This has been present for the last month and a half. He is afebrile. No clear source of infection. Hold off on antibiotics. This is most likely related to his malignancy.  #5 Alcohol abuse: He drinks moderate to significant amounts of beer and wine. He'll be monitored on telemetry and placed on CIWA protocol. Thiamine will be given intravenously.  #6 history of lung cancer with new pulmonary nodules: This was diagnosed originally in 2006/7. He underwent surgery (?pneumonectomy) on both his lungs and had radiation treatment. He's never had chemotherapy. Management per oncology. The pulmonary nodules detected recently could be metastatic process from his head and neck cancer.  ADDENDUM Ct report reviewed and raises concern for possible infection around tumor. Notified by Rn that patient has a fever now. Blood cultures. Will initiate Zosyn for now due to elevated WBC.  DVT Prophylaxis: Lovenox Code Status: Full code Family Communication: Discussed in detail with the patient and his 2 daughters  Disposition Plan: Admit to telemetry   Further management decisions will depend on results of further testing and patient's response to treatment.   [facility]  [facility] Pager [phone]  If 7PM-7AM, please contact night-coverage [email or web address] Password [facility]  10/01/2014, 3:41 PM

## 2014-10-01 NOTE — ED Notes (Signed)
Hospitalist at bedside 

## 2014-10-01 NOTE — ED Provider Notes (Signed)
CSN: [id number]     Arrival date & time 10/01/14  1043 History   First MD Initiated Contact with Patient 10/01/14 1136     Chief Complaint  Patient presents with  . Shortness of Breath  . Dysphagia   HPI Pt was trying to swallow a pill this morning.  He often has trouble and has to wash it down with milk.  He tried to do that this morning and felt like the pill got stuck.  It made it hard for him to breathe.  That lasted a few minutes.  That has improved now.  Pt was able to spit the pill out.  Pt can now swallow his saliva.  Pt does have throat and lung ca.   Past Medical History  Diagnosis Date  . Rheumatic fever age 81  . Allergic rhinitis   . Strabismus   . Cancer     lung ca dx'd 2006   Past Surgical History  Procedure Laterality Date  . Tonsillectomy    . Cholecystectomy    . Right elbow surgery    . Appendectomy    . Total hip arthroplasty      Left  . Colon resection x 2      for perforated colon-colostomy x 6 months  . Lobectomy      RUL-for adenoca T2,N0,MX  . Lul wedge resection      for adenoca with radioactive seeds  . Bilatteral vat's lung resections    . Nasal fracture surgery    . Right arm     Family History  Problem Relation Age of Onset  . Other Father     cardiac arrythmia   History  Substance Use Topics  . Smoking status: Former Smoker    Quit date: 09/17/1983  . Smokeless tobacco: Not on file  . Alcohol Use: Not on file    Review of Systems  All other systems reviewed and are negative.     Allergies  Review of patient's allergies indicates no known allergies.  Home Medications   Prior to Admission medications   Medication Sig Start Date End Date Taking? Authorizing Provider  Aspirin-Acetaminophen (GOODYS BODY PAIN PO) Take 1 packet by mouth every 6 (six) hours as needed (for pain).    Yes Historical Provider, MD  fexofenadine (ALLEGRA) 180 MG tablet Take 180 mg by mouth daily.   Yes Historical Provider, MD  HYDROcodone-acetaminophen  (NORCO) 7.5-325 MG per tablet Take 1 tablet by mouth 3 (three) times daily as needed for moderate pain.   Yes Historical Provider, MD  ibuprofen (ADVIL,MOTRIN) 200 MG tablet Take 200 mg by mouth every 6 (six) hours as needed for mild pain.    Yes Historical Provider, MD  omeprazole (PRILOSEC) 40 MG capsule Take 40 mg by mouth daily.   Yes Historical Provider, MD  UNABLE TO FIND Weekly allergy shots   Yes Historical Provider, MD  vitamin C (ASCORBIC ACID) 500 MG tablet Take 500 mg by mouth daily.   Yes Historical Provider, MD   BP 134/59 mmHg  Pulse 100  Temp(Src) 99.1 F (37.3 C) (Oral)  Resp 20  SpO2 94% Physical Exam  Constitutional: No distress.  HENT:  Head: Normocephalic and atraumatic.  Right Ear: External ear normal.  Left Ear: External ear normal.  Eyes: Conjunctivae are normal. Right eye exhibits no discharge. Left eye exhibits no discharge. No scleral icterus.  Neck: Neck supple. No tracheal deviation present.  No palpable mass, no stridor, able to speak in full  sentences  Cardiovascular: Normal rate, regular rhythm and intact distal pulses.   Pulmonary/Chest: Effort normal and breath sounds normal. No stridor. No respiratory distress. He has no wheezes. He has no rales.  Few rhonchi noted with coughing  Abdominal: Soft. Bowel sounds are normal. He exhibits no distension. There is no tenderness. There is no rebound and no guarding.  Musculoskeletal: He exhibits no edema or tenderness.  Neurological: He is alert. He has normal strength. No cranial nerve deficit (no facial droop, extraocular movements intact, no slurred speech) or sensory deficit. He exhibits normal muscle tone. He displays no seizure activity. Coordination normal.  Skin: Skin is warm and dry. No rash noted. He is not diaphoretic.  Psychiatric: He has a normal mood and affect.  Nursing note and vitals reviewed.   ED Course  Procedures (including critical care time) Labs Review Labs Reviewed  CBC WITH  DIFFERENTIAL - Abnormal; Notable for the following:    WBC 20.0 (*)    Platelets 471 (*)    Neutrophils Relative % 85 (*)    Neutro Abs 16.9 (*)    Lymphocytes Relative 9 (*)    Monocytes Absolute 1.2 (*)    All other components within normal limits  COMPREHENSIVE METABOLIC PANEL - Abnormal; Notable for the following:    Glucose, Bld 140 (*)    BUN 25 (*)    Albumin 3.3 (*)    Alkaline Phosphatase 145 (*)    GFR calc non Af Amer 87 (*)    All other components within normal limits    Imaging Review Dg Chest 2 View  10/01/2014   CLINICAL DATA:  CXR for aspiration into airway. Pt complains of right sided neck swelling, generalized SOB, coughing. Hx of bilateral lung cancer, strabismus, allergic rhinitis, rheumatic fever. Prior RUL lobectomy, LUL wedge resection w/ radioactive seed, Bilateral VAT's resections. Former smoker.  EXAM: CHEST  2 VIEW  COMPARISON:  PET-CT dated 09/07/2014.  FINDINGS: Grossly normal-sized heart. Interval small right pleural effusion.a previously demonstrated left lower lobe lung nodules. Left upper chest surgical clips and staples. Cholecystectomy clips. Old, healed left clavicle fracture.  IMPRESSION: 1. Interval small right pleural effusion. 2. Previously noted left lower lobe nodules.   Electronically Signed   By: Enrique Sack M.D.   On: 10/01/2014 14:11     IMPRESSION: 1. Dominant finding is a large hypermetabolic mass involving the right vallecula and extending posterior to the arytenoid cartilage to potentially involve the proximal esophagus. Concern for primary head and neck cancer. Recommend direct visual evaluation and consider CT of the neck with contrast for more detailed anatomy.  2. Hypermetabolic necrotic right level II lymph nodes consistent with local nodal metastasis.  3. Five hypermetabolic nodules within the left lower lobe with differential including metastatic disease versus lung cancer recurrence.  4. Single hypermetabolic right  upper lobe nodule is concerning for metastasis.  Findings conveyed toMOHAMED MOHAMED on 09/07/2014 at11:22  Medications  sodium chloride 0.9 % bolus 1,000 mL (0 mLs Intravenous Stopped 10/01/14 1517)  morphine 4 MG/ML injection 4 mg (4 mg Intravenous Given 10/01/14 1246)    MDM   Final diagnoses:  Head and neck cancer  Dysphagia   The patient's recent imaging studies were reviewed. He had a negative MRI of the brain recently. He did have a PET scan showing a neck mass.  Patient attempted to drink some water in the emergency department. The patient had difficulty even swallowing that. He felt like it got caught and he had  to spit it out.  He is able to handle his secretions.  The patient is having worsening dysphagia associated with this neck mass. He is not having any stridor or difficulty breathing but he is not able to take in any oral food or fluids. Discussed the case with Dr. Lindi Adie. Plan on admission with ENT and oncology consultation.  Neck CT ordered.  Dr Maryland Pink will see the patient in the ED    Dorie Rank, MD 10/01/14 1520

## 2014-10-01 NOTE — Progress Notes (Signed)
Pharmacy - Zosyn  Assessment: Indication: possible infection around hypopharyngeal tumor.  He has no known allergies. SCr is wnl, CrCl ~75.  Plan:  Start Zosyn 3.375g IV Q8H infused over 4hrs. Dose adjustments are not likely to be necessary so we will sign-off.   Romeo Rabon, PharmD, pager [phone]. 10/01/2014,5:18 PM.

## 2014-10-01 NOTE — ED Notes (Signed)
Pt sts that he was dx with throat CA in December 2015. Pt has large mass to R side of neck that oncology reports is "pushing" on voice box. Pt and family report difficulty swallowing since. Pt sts that he was trying to swallow a pill this am, was choking and felt like he could not breathe. Pt denies SOB but reports throat tightness. Pt is A&O and in NAD.

## 2014-10-01 NOTE — ED Notes (Signed)
Pt from home vis EMS-Per EMS, pt reports that he "choked on spit" and is now feeling SOB. EMS reports that pt was recently dx with throat CA with neck mass. Pt reports that he is producing large amounts of thick sputum and is having difficulty clearing secretions. Pt is A&O and in NAD.

## 2014-10-01 NOTE — ED Notes (Signed)
Pt IV infiltrated. Unsuccessful at placing another IV. Patty, RN will place IV so pt may get fluids

## 2014-10-02 LAB — COMPREHENSIVE METABOLIC PANEL
ALBUMIN: 2.9 g/dL — AB (ref 3.5–5.2)
ALK PHOS: 123 U/L — AB (ref 39–117)
ALT: 22 U/L (ref 0–53)
ANION GAP: 14 (ref 5–15)
AST: 33 U/L (ref 0–37)
BUN: 22 mg/dL (ref 6–23)
CALCIUM: 9 mg/dL (ref 8.4–10.5)
CO2: 26 mmol/L (ref 19–32)
CREATININE: 0.66 mg/dL (ref 0.50–1.35)
Chloride: 100 mEq/L (ref 96–112)
GFR calc Af Amer: >90 mL/min (ref >90)
GFR calc non Af Amer: 88 mL/min — ABNORMAL LOW (ref >90)
GLUCOSE: 80 mg/dL (ref 70–99)
POTASSIUM: 4 mmol/L (ref 3.5–5.1)
SODIUM: 140 mmol/L (ref 135–145)
Total Bilirubin: 0.8 mg/dL (ref 0.3–1.2)
Total Protein: 6.3 g/dL (ref 6.0–8.3)

## 2014-10-02 LAB — CBC
HCT: 38.8 % — ABNORMAL LOW (ref 39.0–52.0)
Hemoglobin: 13.1 g/dL (ref 13.0–17.0)
MCH: 32.7 pg (ref 26.0–34.0)
MCHC: 33.8 g/dL (ref 30.0–36.0)
MCV: 96.8 fL (ref 78.0–100.0)
Platelets: 402 10*3/uL — ABNORMAL HIGH (ref 150–400)
RBC: 4.01 MIL/uL — ABNORMAL LOW (ref 4.22–5.81)
RDW: 13.4 % (ref 11.5–15.5)
WBC: 17.7 10*3/uL — ABNORMAL HIGH (ref 4.0–10.5)

## 2014-10-02 MED ORDER — PHENOL 1.4 % MT LIQD
1.0000 | OROMUCOSAL | Status: DC | PRN
  Filled 2014-10-02: qty 177

## 2014-10-02 MED ORDER — MORPHINE SULFATE 2 MG/ML IJ SOLN
2.0000 mg | INTRAMUSCULAR | Status: DC | PRN
  Administered 2014-10-02 – 2014-10-03 (×9): 4 mg via INTRAVENOUS
  Administered 2014-10-04 (×6): 2 mg via INTRAVENOUS
  Administered 2014-10-04: 4 mg via INTRAVENOUS
  Administered 2014-10-04 – 2014-10-06 (×16): 2 mg via INTRAVENOUS
  Filled 2014-10-02 (×9): qty 1
  Filled 2014-10-02 (×3): qty 2
  Filled 2014-10-02 (×2): qty 1
  Filled 2014-10-02: qty 2
  Filled 2014-10-02 (×2): qty 1
  Filled 2014-10-02: qty 2
  Filled 2014-10-02 (×2): qty 1
  Filled 2014-10-02: qty 2
  Filled 2014-10-02 (×3): qty 1
  Filled 2014-10-02: qty 2
  Filled 2014-10-02 (×3): qty 1
  Filled 2014-10-02 (×4): qty 2

## 2014-10-02 NOTE — Progress Notes (Signed)
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 Comes in with mechanical dysphagia due to the mass. He was admitted for further management.  Past medical history:  Past Medical History  Diagnosis Date  . Rheumatic fever age 45  . Allergic rhinitis   . Strabismus   . Cancer     lung ca dx'd 2006    Consultants: ENT and oncology pending  Procedures: None  Antibiotics: Zosyn 1/16  Subjective: Patient denies any problems overnight. Continues to have cough every few minutes and he expectorates clear sputum. Denies any shortness of breath. Frustrated that he is unable to eat or drink.  Objective: Vital Signs  Filed Vitals:   10/01/14 1620 10/01/14 1656 10/02/14 0025 10/02/14 0615  BP: 133/58 152/71 134/67 121/64  Pulse: 98 94 79 77  Temp:  101.7 F (38.7 C) 98.5 F (36.9 C) 98.1 F (36.7 C)  TempSrc:  Oral Oral Oral  Resp: 16 20 20 20   Height:  5' 9.5" (1.765 m)    Weight:  73.936 kg (163 lb)    SpO2: 94% 94% 97% 95%    Intake/Output Summary (Last 24 hours) at 10/02/14 0924 Last data filed at 10/02/14 0618  Gross per 24 hour  Intake      0 ml  Output    300 ml  Net   -300 ml   Filed Weights   10/01/14 1656  Weight: 73.936 kg (163 lb)    General appearance: alert, cooperative, appears stated age and no distress Resp: Coarse breath sounds bilaterally. No wheezing Rales or rhonchi. Cardio: regular rate and rhythm, S1, S2 normal, no murmur, click, rub or gallop GI: soft, non-tender; bowel sounds normal; no masses,  no organomegaly Extremities: extremities normal, atraumatic, no cyanosis or edema Neurologic: No focal deficits  Lab Results:  Basic Metabolic Panel:  Recent Labs Lab 09/29/14 0937 10/01/14 1231  10/02/14 0713  NA 135* 137 140  K 4.1 3.8 4.0  CL  --  100 100  CO2 29 29 26   GLUCOSE 138 140* 80  BUN 21.3 25* 22  CREATININE 0.8 0.68 0.66  CALCIUM 10.0 9.4 9.0   Liver Function Tests:  Recent Labs Lab 09/29/14 0937 10/01/14 1231 10/02/14 0713  AST 19 27 33  ALT 12 21 22   ALKPHOS 127 145* 123*  BILITOT 0.90 1.1 0.8  PROT 7.1 7.1 6.3  ALBUMIN 3.0* 3.3* 2.9*   CBC:  Recent Labs Lab 09/29/14 0936 10/01/14 1231 10/02/14 0539  WBC 19.1* 20.0* 17.7*  NEUTROABS 15.7* 16.9*  --   HGB 15.3 14.9 13.1  HCT 44.2 43.9 38.8*  MCV 94.0 96.5 96.8  PLT 433* 471* 402*     Recent Results (from the past 240 hour(s))  Culture, blood (routine x 2)     Status: None (Preliminary result)   Collection Time: 10/01/14  6:26 PM  Result Value Ref Range Status   Specimen Description BLOOD RIGHT HAND  Final   Special Requests BOTTLES DRAWN AEROBIC AND ANAEROBIC 10 CC EACH  Final   Culture   Final           BLOOD CULTURE RECEIVED NO GROWTH TO DATE CULTURE WILL BE HELD FOR 5 DAYS BEFORE ISSUING A FINAL NEGATIVE REPORT Performed at [facility]    Report  Status PENDING  Incomplete  Culture, blood (routine x 2)     Status: None (Preliminary result)   Collection Time: 10/01/14  6:26 PM  Result Value Ref Range Status   Specimen Description BLOOD RIGHT ARM  Final   Special Requests BOTTLES DRAWN AEROBIC ONLY 10 CC  Final   Culture   Final           BLOOD CULTURE RECEIVED NO GROWTH TO DATE CULTURE WILL BE HELD FOR 5 DAYS BEFORE ISSUING A FINAL NEGATIVE REPORT Note: Culture results may be compromised due to an excessive volume of blood received in culture bottles. Performed at [facility]    Report Status PENDING  Incomplete      Studies/Results: Dg Chest 2 View  10/01/2014   CLINICAL DATA:  CXR for aspiration into airway. Pt complains of right sided neck swelling, generalized SOB, coughing. Hx of bilateral lung cancer, strabismus, allergic rhinitis, rheumatic fever.  Prior RUL lobectomy, LUL wedge resection w/ radioactive seed, Bilateral VAT's resections. Former smoker.  EXAM: CHEST  2 VIEW  COMPARISON:  PET-CT dated 09/07/2014.  FINDINGS: Grossly normal-sized heart. Interval small right pleural effusion.a previously demonstrated left lower lobe lung nodules. Left upper chest surgical clips and staples. Cholecystectomy clips. Old, healed left clavicle fracture.  IMPRESSION: 1. Interval small right pleural effusion. 2. Previously noted left lower lobe nodules.   Electronically Signed   By: Enrique Sack M.D.   On: 10/01/2014 14:11   Ct Soft Tissue Neck W Contrast  10/01/2014   CLINICAL DATA:  Choking on spit, shortness of breath, lung cancer 2006, recently diagnosed with throat cancer with neck mass, difficulty clearing secretions  EXAM: CT NECK WITH CONTRAST  TECHNIQUE: Multidetector CT imaging of the neck was performed using the standard protocol following the bolus administration of intravenous contrast. Sagittal and coronal MPR images reconstructed from axial data set.  CONTRAST:  189mL OMNIPAQUE IOHEXOL 300 MG/ML  SOLN IV  COMPARISON:  PET-CT 09/07/2014  FINDINGS: Pharynx and larynx: Large soft tissue mass identified at the RIGHT lateral aspect of the hypopharynx, 2.7 x 2.7 x 3.6 cm in size and extending into the RIGHT lateral aspect of the upper larynx. Mass extends posterior to the larynx into the upper esophagus where it measures approximately 4.1 x 2.0 cm image 70 extending 3.6 cm length. Mass extends through the laryngeal cartilage laterally. Mass corresponds to area of hyper metabolism on recent PET-CT.  Salivary glands: Symmetric normal appearing parotid and submandibular glands.  Thyroid: Normal appearing LEFT thyroid lobe. Compression and mild displacement of the upper pole of the RIGHT thyroid lobe by extrinsic fluid collection.  Lymph nodes: Fluid collection containing foci of gas lateral to the RIGHT hypopharynx mass question necrotic versus abscessed lymph node  2.4 x 2.2 x 4.2 cm or potentially an unusual lateral hypo pharyngeal diverticulum. More cranially, a fluid collection with an adjacent enhancing soft tissue nodule is identified the anterior margin of the RIGHT sternocleidomastoid, chronic tumor versus necrotic lymph node, 3.5 x 3.5 x 3.5 cm. Additional normal sized anterior cervical lymph nodes bilaterally.  Vascular: Significant scattered atherosclerotic calcifications in the carotid systems including carotid bifurcations and carotid siphons. Compression of the RIGHT jugular vein. Major vascular structures appear patent.  Limited intracranial: Unremarkable  Mastoids and visualized paranasal sinuses: Clear. Concha bullosa middle turbinates. Middle ear cavities appear clear bilaterally.  Skeleton: Degenerative disc and facet disease changes cervical spine. No osseous metastases.  Upper chest: 6 mm RIGHT upper lobe nodule image 30 previously  5 mm. Postsurgical changes and anterior LEFT upper lobe with brachytherapy seed implants and scarring. Ground-glass nodule LEFT upper lobe 14 x 10 mm unchanged.  IMPRESSION: Large trans spatial tumor involving the RIGHT lateral aspect the hypopharynx and RIGHT piriform sinus region, upper esophagus, and extending into the RIGHT larynx.  This tumor may either represent a hypo pharyngeal or esophageal origin.  Mass is likely obstructing the esophagus/hypopharynx.  Partially necrotic metastatic lymph node at anterior RIGHT sternocleidomastoid muscle.  Additional gas and fluid collection lateral to the tumor at the level of the upper larynx could represent a necrotic or abscessed nodal disease or an unusual hypo pharyngeal diverticulum.  Minimal increase in size of a RIGHT upper lobe nodule question metastasis.  Stable ground-glass nodule at LEFT apex.   Electronically Signed   By: Lavonia Dana M.D.   On: 10/01/2014 17:01    Medications:  Scheduled: . enoxaparin (LOVENOX) injection  40 mg Subcutaneous Q24H  .  piperacillin-tazobactam (ZOSYN)  IV  3.375 g Intravenous 3 times per day  . sodium chloride  3 mL Intravenous Q12H  . thiamine  100 mg Intravenous Daily   Continuous: . sodium chloride 75 mL/hr at 10/01/14 1738   OXB:[id number], albuterol, LORazepam, morphine injection, ondansetron (ZOFRAN) IV  Assessment/Plan:  Principal Problem:   Squamous cell cancer of hypopharynx Active Problems:   Multiple lung nodules   Head and neck cancer   Mechanical dysphagia   Alcohol abuse   Leukocytosis   Dehydration   Weight loss    Mechanical dysphagia This is secondary to his head and neck cancer. CT scan of his neck is as above. There appears to be an obstruction of the esophageal opening. Await input from ENT and oncology to determine if there is any role for palliative surgery or other intervention. Continue strict nothing by mouth for now. Keep head of bed elevated.  Fever Blood cultures are pending. He was started on Zosyn due to possibility of infection around this tumor. WBC is improved today.  Squamous cell cancer of head and neck Await input from oncology and ENT. Strict nothing by mouth. Discussed briefly with Dr. Wilburn Cornelia yesterday. Dr. Constance Holster to see him tomorrow.   Dehydration and weight loss Secondary to dysphagia. Continue with IV fluids. NPO.   Leukocytosis This has been present for the last month and a half. He developed a fever yesterday as discussed above. Continue Zosyn. WBC is better.  Alcohol abuse He drinks moderate to significant amounts of beer and wine. No signs of withdrawal. Continue telemetry and CIWA protocol. Thiamine will be given intravenously.  Previous history of lung cancer with new pulmonary nodules Lung cancer was diagnosed originally in 2006/7. He underwent surgery (?pneumonectomy) on both his lungs and had radiation treatment. He's never had chemotherapy. Management per oncology. The pulmonary nodules detected recently could be metastatic process  from his head and neck cancer.  DVT Prophylaxis: Lovenox    Code Status: Full code for now  Family Communication: Discussed with the patient and his daughters  Disposition Plan: Not ready for discharged    LOS: 1 day   [facility] Pager [phone] 10/02/2014, 9:24 AM  If 7PM-7AM, please contact night-coverage at [email or web address], password [email or web address]

## 2014-10-03 ENCOUNTER — Telehealth: Payer: Self-pay | Admitting: Medical Oncology

## 2014-10-03 DIAGNOSIS — E162 Hypoglycemia, unspecified: Secondary | ICD-10-CM

## 2014-10-03 DIAGNOSIS — Z85118 Personal history of other malignant neoplasm of bronchus and lung: Secondary | ICD-10-CM

## 2014-10-03 DIAGNOSIS — R5381 Other malaise: Secondary | ICD-10-CM

## 2014-10-03 DIAGNOSIS — D630 Anemia in neoplastic disease: Secondary | ICD-10-CM

## 2014-10-03 DIAGNOSIS — E46 Unspecified protein-calorie malnutrition: Secondary | ICD-10-CM

## 2014-10-03 DIAGNOSIS — E43 Unspecified severe protein-calorie malnutrition: Secondary | ICD-10-CM | POA: Insufficient documentation

## 2014-10-03 DIAGNOSIS — Z811 Family history of alcohol abuse and dependence: Secondary | ICD-10-CM

## 2014-10-03 LAB — BASIC METABOLIC PANEL
ANION GAP: 12 (ref 5–15)
BUN: 23 mg/dL (ref 6–23)
CO2: 27 mmol/L (ref 19–32)
Calcium: 9 mg/dL (ref 8.4–10.5)
Chloride: 104 mEq/L (ref 96–112)
Creatinine, Ser: 0.57 mg/dL (ref 0.50–1.35)
GFR calc Af Amer: >90 mL/min (ref >90)
GLUCOSE: 68 mg/dL — AB (ref 70–99)
POTASSIUM: 3.8 mmol/L (ref 3.5–5.1)
Sodium: 143 mmol/L (ref 135–145)

## 2014-10-03 LAB — GLUCOSE, CAPILLARY
GLUCOSE-CAPILLARY: 65 mg/dL — AB (ref 70–99)
GLUCOSE-CAPILLARY: 86 mg/dL (ref 70–99)
GLUCOSE-CAPILLARY: 93 mg/dL (ref 70–99)
Glucose-Capillary: 134 mg/dL — ABNORMAL HIGH (ref 70–99)
Glucose-Capillary: 89 mg/dL (ref 70–99)

## 2014-10-03 LAB — CBC
HCT: 38.7 % — ABNORMAL LOW (ref 39.0–52.0)
Hemoglobin: 12.2 g/dL — ABNORMAL LOW (ref 13.0–17.0)
MCH: 31.4 pg (ref 26.0–34.0)
MCHC: 31.5 g/dL (ref 30.0–36.0)
MCV: 99.5 fL (ref 78.0–100.0)
Platelets: 434 10*3/uL — ABNORMAL HIGH (ref 150–400)
RBC: 3.89 MIL/uL — ABNORMAL LOW (ref 4.22–5.81)
RDW: 13.4 % (ref 11.5–15.5)
WBC: 13.2 10*3/uL — AB (ref 4.0–10.5)

## 2014-10-03 MED ORDER — DEXTROSE-NACL 5-0.45 % IV SOLN
INTRAVENOUS | Status: DC
  Administered 2014-10-03 – 2014-10-06 (×5): via INTRAVENOUS

## 2014-10-03 MED ORDER — DEXTROSE 50 % IV SOLN
INTRAVENOUS | Status: AC
  Filled 2014-10-03: qty 50

## 2014-10-03 MED ORDER — DEXTROSE 50 % IV SOLN
25.0000 mL | Freq: Once | INTRAVENOUS | Status: AC
  Administered 2014-10-03: 25 mL via INTRAVENOUS

## 2014-10-03 MED ORDER — CETYLPYRIDINIUM CHLORIDE 0.05 % MT LIQD
7.0000 mL | Freq: Two times a day (BID) | OROMUCOSAL | Status: DC
  Administered 2014-10-03 – 2014-10-11 (×15): 7 mL via OROMUCOSAL

## 2014-10-03 NOTE — Progress Notes (Signed)
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 Comes in with mechanical dysphagia due to the mass. He was admitted for further management.  Past medical history:  Past Medical History  Diagnosis Date  . Rheumatic fever age 26  . Allergic rhinitis   . Strabismus   . Cancer     lung ca dx'd 2006    Consultants: ENT and oncology pending  Procedures: None  Antibiotics: Zosyn 1/16  Subjective: Patient was confused last night. This is per his daughter. Seems back to baseline this morning. Somewhat somnolent. Denies any shortness of breath. Continues to have pain in his throat area, though it's better.   Objective: Vital Signs  Filed Vitals:   10/02/14 0615 10/02/14 1304 10/02/14 2111 10/03/14 0516  BP: 121/64 119/71 132/63 122/59  Pulse: 77 74 77 77  Temp: 98.1 F (36.7 C) 97.8 F (36.6 C) 98.2 F (36.8 C) 98.7 F (37.1 C)  TempSrc: Oral Oral Oral Oral  Resp: 20 19 19 19   Height:      Weight:      SpO2: 95% 97% 93% 92%    Intake/Output Summary (Last 24 hours) at 10/03/14 0849 Last data filed at 10/03/14 0210  Gross per 24 hour  Intake    150 ml  Output      0 ml  Net    150 ml   Filed Weights   10/01/14 1656  Weight: 73.936 kg (163 lb)    General appearance: alert, cooperative, appears stated age and no distress Resp: Coarse breath sounds bilaterally. No wheezing Rales or rhonchi. Cardio: regular rate and rhythm, S1, S2 normal, no murmur, click, rub or gallop GI: soft, non-tender; bowel sounds normal; no masses,  no organomegaly Extremities: extremities normal, atraumatic, no cyanosis or edema   Lab Results:  Basic Metabolic Panel:  Recent Labs Lab 09/29/14 0937 10/01/14 1231 10/02/14 0713 10/03/14 0440  NA  135* 137 140 143  K 4.1 3.8 4.0 3.8  CL  --  100 100 104  CO2 29 29 26 27   GLUCOSE 138 140* 80 68*  BUN 21.3 25* 22 23  CREATININE 0.8 0.68 0.66 0.57  CALCIUM 10.0 9.4 9.0 9.0   Liver Function Tests:  Recent Labs Lab 09/29/14 0937 10/01/14 1231 10/02/14 0713  AST 19 27 33  ALT 12 21 22   ALKPHOS 127 145* 123*  BILITOT 0.90 1.1 0.8  PROT 7.1 7.1 6.3  ALBUMIN 3.0* 3.3* 2.9*   CBC:  Recent Labs Lab 09/29/14 0936 10/01/14 1231 10/02/14 0539 10/03/14 0440  WBC 19.1* 20.0* 17.7* 13.2*  NEUTROABS 15.7* 16.9*  --   --   HGB 15.3 14.9 13.1 12.2*  HCT 44.2 43.9 38.8* 38.7*  MCV 94.0 96.5 96.8 99.5  PLT 433* 471* 402* 434*     Recent Results (from the past 240 hour(s))  Culture, blood (routine x 2)     Status: None (Preliminary result)   Collection Time: 10/01/14  6:26 PM  Result Value Ref Range Status   Specimen Description BLOOD RIGHT HAND  Final   Special Requests BOTTLES DRAWN AEROBIC AND ANAEROBIC 10 CC EACH  Final   Culture   Final           BLOOD CULTURE RECEIVED NO GROWTH TO DATE CULTURE WILL BE  HELD FOR 5 DAYS BEFORE ISSUING A FINAL NEGATIVE REPORT Performed at [facility]    Report Status PENDING  Incomplete  Culture, blood (routine x 2)     Status: None (Preliminary result)   Collection Time: 10/01/14  6:26 PM  Result Value Ref Range Status   Specimen Description BLOOD RIGHT ARM  Final   Special Requests BOTTLES DRAWN AEROBIC ONLY 10 CC  Final   Culture   Final           BLOOD CULTURE RECEIVED NO GROWTH TO DATE CULTURE WILL BE HELD FOR 5 DAYS BEFORE ISSUING A FINAL NEGATIVE REPORT Note: Culture results may be compromised due to an excessive volume of blood received in culture bottles. Performed at [facility]    Report Status PENDING  Incomplete      Studies/Results: Dg Chest 2 View  10/01/2014   CLINICAL DATA:  CXR for aspiration into airway. Pt complains of right sided neck swelling, generalized SOB, coughing. Hx of bilateral lung  cancer, strabismus, allergic rhinitis, rheumatic fever. Prior RUL lobectomy, LUL wedge resection w/ radioactive seed, Bilateral VAT's resections. Former smoker.  EXAM: CHEST  2 VIEW  COMPARISON:  PET-CT dated 09/07/2014.  FINDINGS: Grossly normal-sized heart. Interval small right pleural effusion.a previously demonstrated left lower lobe lung nodules. Left upper chest surgical clips and staples. Cholecystectomy clips. Old, healed left clavicle fracture.  IMPRESSION: 1. Interval small right pleural effusion. 2. Previously noted left lower lobe nodules.   Electronically Signed   By: Enrique Sack M.D.   On: 10/01/2014 14:11   Ct Soft Tissue Neck W Contrast  10/01/2014   CLINICAL DATA:  Choking on spit, shortness of breath, lung cancer 2006, recently diagnosed with throat cancer with neck mass, difficulty clearing secretions  EXAM: CT NECK WITH CONTRAST  TECHNIQUE: Multidetector CT imaging of the neck was performed using the standard protocol following the bolus administration of intravenous contrast. Sagittal and coronal MPR images reconstructed from axial data set.  CONTRAST:  136mL OMNIPAQUE IOHEXOL 300 MG/ML  SOLN IV  COMPARISON:  PET-CT 09/07/2014  FINDINGS: Pharynx and larynx: Large soft tissue mass identified at the RIGHT lateral aspect of the hypopharynx, 2.7 x 2.7 x 3.6 cm in size and extending into the RIGHT lateral aspect of the upper larynx. Mass extends posterior to the larynx into the upper esophagus where it measures approximately 4.1 x 2.0 cm image 70 extending 3.6 cm length. Mass extends through the laryngeal cartilage laterally. Mass corresponds to area of hyper metabolism on recent PET-CT.  Salivary glands: Symmetric normal appearing parotid and submandibular glands.  Thyroid: Normal appearing LEFT thyroid lobe. Compression and mild displacement of the upper pole of the RIGHT thyroid lobe by extrinsic fluid collection.  Lymph nodes: Fluid collection containing foci of gas lateral to the RIGHT  hypopharynx mass question necrotic versus abscessed lymph node 2.4 x 2.2 x 4.2 cm or potentially an unusual lateral hypo pharyngeal diverticulum. More cranially, a fluid collection with an adjacent enhancing soft tissue nodule is identified the anterior margin of the RIGHT sternocleidomastoid, chronic tumor versus necrotic lymph node, 3.5 x 3.5 x 3.5 cm. Additional normal sized anterior cervical lymph nodes bilaterally.  Vascular: Significant scattered atherosclerotic calcifications in the carotid systems including carotid bifurcations and carotid siphons. Compression of the RIGHT jugular vein. Major vascular structures appear patent.  Limited intracranial: Unremarkable  Mastoids and visualized paranasal sinuses: Clear. Concha bullosa middle turbinates. Middle ear cavities appear clear bilaterally.  Skeleton: Degenerative disc and facet  disease changes cervical spine. No osseous metastases.  Upper chest: 6 mm RIGHT upper lobe nodule image 30 previously 5 mm. Postsurgical changes and anterior LEFT upper lobe with brachytherapy seed implants and scarring. Ground-glass nodule LEFT upper lobe 14 x 10 mm unchanged.  IMPRESSION: Large trans spatial tumor involving the RIGHT lateral aspect the hypopharynx and RIGHT piriform sinus region, upper esophagus, and extending into the RIGHT larynx.  This tumor may either represent a hypo pharyngeal or esophageal origin.  Mass is likely obstructing the esophagus/hypopharynx.  Partially necrotic metastatic lymph node at anterior RIGHT sternocleidomastoid muscle.  Additional gas and fluid collection lateral to the tumor at the level of the upper larynx could represent a necrotic or abscessed nodal disease or an unusual hypo pharyngeal diverticulum.  Minimal increase in size of a RIGHT upper lobe nodule question metastasis.  Stable ground-glass nodule at LEFT apex.   Electronically Signed   By: Lavonia Dana M.D.   On: 10/01/2014 17:01    Medications:  Scheduled: . dextrose  25 mL  Intravenous Once  . enoxaparin (LOVENOX) injection  40 mg Subcutaneous Q24H  . piperacillin-tazobactam (ZOSYN)  IV  3.375 g Intravenous 3 times per day  . sodium chloride  3 mL Intravenous Q12H  . thiamine  100 mg Intravenous Daily   Continuous: . dextrose 5 % and 0.45% NaCl     TGG:YIRSWNIOEVOJJ, albuterol, LORazepam, morphine injection, ondansetron (ZOFRAN) IV  Assessment/Plan:  Principal Problem:   Squamous cell cancer of hypopharynx Active Problems:   Multiple lung nodules   Head and neck cancer   Mechanical dysphagia   Alcohol abuse   Leukocytosis   Dehydration   Weight loss    Mechanical dysphagia This is secondary to his head and neck cancer. CT scan of his neck is as above. There appears to be an obstruction of the esophageal opening. Await input from ENT and oncology to determine if there is any role for palliative surgery or other intervention. Continue strict nothing by mouth for now. Keep head of bed elevated.  Fever Blood cultures are negative so far. He was started on Zosyn due to possibility of infection around this tumor. WBC is improved.  Squamous cell cancer of head and neck Await input from oncology and ENT. Strict nothing by mouth. Discussed briefly with Dr. Wilburn Cornelia on the day of admission. Dr. Constance Holster to see him today.   Dehydration and weight loss and hypoglycemia Secondary to dysphagia and poor oral intake. Continue with IV fluids. NPO. Change IV fluids to D5.  Leukocytosis This has been present for the last month and a half. He developed a fever yesterday as discussed above. Continue Zosyn. WBC is better.  Alcohol abuse He drinks moderate to significant amounts of beer and wine. No signs of withdrawal. Continue telemetry and CIWA protocol. Thiamine will be given intravenously.  Previous history of lung cancer with new pulmonary nodules Lung cancer was diagnosed originally in 2006/7. He underwent surgery (?pneumonectomy) on both his lungs and had  radiation treatment. He's never had chemotherapy. Management per oncology. The pulmonary nodules detected recently could be metastatic process from his head and neck cancer.  DVT Prophylaxis: Lovenox    Code Status: Full code for now  Family Communication: Discussed with the patient and his daughters  Disposition Plan: Not ready for discharge.     LOS: 2 days   [facility] Pager [phone] 10/03/2014, 8:49 AM  If 7PM-7AM, please contact night-coverage at [email or web address], password [facility]

## 2014-10-03 NOTE — Progress Notes (Signed)
INITIAL NUTRITION ASSESSMENT  DOCUMENTATION CODES Per approved criteria  -Severe malnutrition in the context of chronic illness  Pt meets criteria for severe MALNUTRITION in the context of chronic illness as evidenced by PO intake < 75% for > one month, 13% body weight loss in three months.   INTERVENTION: -Diet advancement per MD -Recommend supplementation as tolerated -Consider SLP consult as warranted/tolerated -Consider use of nutrition support if unable to meet nutritional needs via PO intake -RD to continue to monitor  NUTRITION DIAGNOSIS: Inadequate oral intake related to dysphagia/decreased appetite as evidenced by 25 lb wt loss in 3 months, PO intake < 75%.   Goal: Pt to meet >/= 90% of their estimated nutrition needs  Monitor:  Diet order, swallow profile, total intake, nutrition support needs,  labs, weights  Reason for Assessment: MST  81 y.o. male  Admitting Dx: Squamous cell cancer of hypopharynx  ASSESSMENT: Kyle Bauer is a 81 y.o. male with a past medical history of lung cancer for which he underwent surgery and radiation treatment in 2006 and 2007, history of allergies, acid reflux disease who was recently diagnosed with head and neck cancer, who presents today with difficulty swallowing liquids.  -Pt NPO. Family in room confirmed pt unable to swallow, currently waiting to discuss plan with Oncology regarding palliative surgery vs chemo and radiation. Will continue to monitor POC for nutrition interventions as warranted/tolerated. -MD noted pt with ongoing decreased appetite/dysphagia for past 1-2 months, was consuming liquids or semi soft foods. -Endorsed ongoing weight loss of ~20 lbs. Previous medical records indicate wt loss of 25 lb in past 3 months (13% body weight loss, severe for time frame) -Medical records indicate pt with prior hx of ETOH abuse  -Hypoglycemic episode, fluids modified to D5  Height: Ht Readings from Last 1 Encounters:  10/01/14  5' 9.5" (1.765 m)    Weight: Wt Readings from Last 1 Encounters:  10/01/14 163 lb (73.936 kg)    Ideal Body Weight: 160 lb  % Ideal Body Weight: 102%  Wt Readings from Last 10 Encounters:  10/01/14 163 lb (73.936 kg)  09/15/14 172 lb 3.2 oz (78.109 kg)  09/01/14 177 lb 8 oz (80.513 kg)  07/21/14 187 lb 3.2 oz (84.913 kg)  05/26/14 190 lb 6.4 oz (86.365 kg)  11/24/13 199 lb 6.4 oz (90.447 kg)  11/10/13 197 lb 12.8 oz (89.721 kg)  08/26/13 205 lb 9.6 oz (93.26 kg)  11/10/12 203 lb 12.8 oz (92.443 kg)  08/27/12 202 lb 9.6 oz (91.899 kg)    Usual Body Weight: 190-200 lbs per MD note and med records  % Usual Body Weight: ~86%  BMI:  Body mass index is 23.73 kg/(m^2).  Estimated Nutritional Needs: [id number] Protein: 90-100 gram Fluid: >/=2200 ml daily  Skin: WDl  Diet Order: Diet NPO time specified Except for: Other (See Comments)  EDUCATION NEEDS: -No education needs identified at this time   Intake/Output Summary (Last 24 hours) at 10/03/14 1323 Last data filed at 10/03/14 0900  Gross per 24 hour  Intake    100 ml  Output      0 ml  Net    100 ml    Last BM: 1/18   Labs:   Recent Labs Lab 10/01/14 1231 10/02/14 0713 10/03/14 0440  NA 137 140 143  K 3.8 4.0 3.8  CL 100 100 104  CO2 29 26 27   BUN 25* 22 23  CREATININE 0.68 0.66 0.57  CALCIUM 9.4 9.0 9.0  GLUCOSE  140* 80 68*    CBG (last 3)   Recent Labs  10/03/14 0844 10/03/14 0910 10/03/14 1152  GLUCAP 65* 134* 89    Scheduled Meds: . enoxaparin (LOVENOX) injection  40 mg Subcutaneous Q24H  . piperacillin-tazobactam (ZOSYN)  IV  3.375 g Intravenous 3 times per day  . sodium chloride  3 mL Intravenous Q12H  . thiamine  100 mg Intravenous Daily    Continuous Infusions: . dextrose 5 % and 0.45% NaCl 75 mL/hr at 10/03/14 0857    Past Medical History  Diagnosis Date  . Rheumatic fever age 36  . Allergic rhinitis   . Strabismus   . Cancer     lung ca dx'd 2006    Past  Surgical History  Procedure Laterality Date  . Tonsillectomy    . Cholecystectomy    . Right elbow surgery    . Appendectomy    . Total hip arthroplasty      Left  . Colon resection x 2      for perforated colon-colostomy x 6 months  . Lobectomy      RUL-for adenoca T2,N0,MX  . Lul wedge resection      for adenoca with radioactive seeds  . Bilatteral vat's lung resections    . Nasal fracture surgery    . Right arm      [place] Clinical Dietitian [phone]

## 2014-10-03 NOTE — Care Management Note (Addendum)
    Page 1 of 2   10/18/2014     3:33:13 PM CARE MANAGEMENT NOTE 10/18/2014  Patient:  Kyle Bauer, Kyle Bauer   Account Number:  [id number]  Date Initiated:  10/03/2014  Documentation initiated by:  Dessa Phi  Subjective/Objective Assessment:   81 y/o m admitted w/sob,L neck mass.ZO:XWRU ca     Action/Plan:   From home alone.family support-daughters.Has pcp,pharmacy,cane,rw.   Anticipated DC Date:  10/12/2014   Anticipated DC Plan:  [place]  CM consult      [facility] Choice  HOME HEALTH   Choice offered to / List presented to:  C-4 Adult Children        HH arranged  HH-1 RN      [place].   Status of service:  Completed, signed off Medicare Important Message given?  YES (If response is "NO", the following Medicare IM given date fields will be blank) Date Medicare IM given:  10/03/2014 Medicare IM given by:  Dessa Phi Date Additional Medicare IM given:  10/10/2014 Additional Medicare IM given by:  Dessa Phi  Discharge Disposition:  [place]  Per UR Regulation:  Reviewed for med. necessity/level of care/duration of stay  If discussed at [place] of Stay Meetings, dates discussed:   10/06/2014  10/10/2014    Comments:  10/18/14 Dessa Phi RN BSN NCM [phone] received call from Dr. Johny Shears nurse onc Tobie Poet [phone] about [place] orders. She states dtr keeps calling their office asking for [place] orders. TC [facility] rep Cyril Mourning who will f/u w/Samantha about [place] orders.  10/11/14 Dessa Phi RN BSN NCM [phone] D/C home w/HHC orders HHRN/PT.Nurse provided family w/osmolite.[facility] dme will have home visit in am. Awaiting [facility]HC orders for HHRN/PT.Family trying to find a pharmacy that has osmolite.[facility] dme rep Pura Spice also helping to find a pharmacy.Patient cannot d/c without osmolite.  10/10/14 Dessa Phi RN BSN NCM [phone] Spoke to dtrs about [place] services.They chose [facility].TC Kristen rep  aware of HHRN-TF,dme rep Pura Spice also aware of referral. Will await specific Tube Feeding orders-pump vs bolus.Await final HHRN order.  10/09/2014 1200 NCM spoke to pt and gave permission to speak to dtr, Maryjo [place] # [phone] or Arlyss Queen [phone]. Offered choice for [facility]. Agreeable to [facility] for [facility]. Pt has RW and 3n1 at home. Waiting orders for tube feeds and HH RN/F2F needed. Jonnie Finner RN CCM Case Mgmt phone [phone]  10/06/14 Dessa Phi RN BSN NCM [phone] POD#1 PEG tube placement.TPN today.Oncology following-palliative xrt.Recommend HHRN-TPN/PEG tube mgmnt.  10/03/14 Dessa Phi RN BSN NCM [phone] Oncology following. Monitor progress for d/c needs.Would recommend PT/OT cons when appropriate.

## 2014-10-03 NOTE — Telephone Encounter (Signed)
I told Angie that Julien Nordmann will be seeing pt.

## 2014-10-03 NOTE — Progress Notes (Signed)
Hypoglycemic Event  CBG: 65  Treatment: 25 ml D50 IV  Symptoms: none  Follow-up CBG: ONGE:[id number] CBG Result:134  Possible Reasons for Event: Pt is NPO   Comments/MD notified:0845    Laurance Flatten E  Remember to initiate Hypoglycemia Order Set & complete

## 2014-10-03 NOTE — Progress Notes (Signed)
Kyle Bauer   DOB:[birth year]   HY#:[id number]   INO#:[id number]  Patient Care Team: Leonard Downing, Kyle Bauer as PCP - General  Subjective: Kyle Bauer is an 81 year old male With a history of right upper lobe non-small cell lung cancer, currently on observation. He was admitted on 10/01/2014 due to mechanical dyshagia. This is due to a hypopharyngeal area mass found recently during the examination, as he was complaining of hoarseness and trouble swallowing. He denies any increased drooling. The patient on admission continued to have increased cough, without significant shortness of breath. He denies any chest pain. He denies any abdominal pain nausea or vomiting. No bleeding issues are reported. No neurological changes are seen. The patient has lost about 20 lbs in less than 1 month due to inability to eat or drink. Fine-needle aspiration biopsy of the right neck mass was positive for squamous cell carcinoma (case number is [id number]) A CT of the neck 10/01/2014 showed a right hypopharyngeal 2.7 x 2.7 x 3.6 cm mass extending into the right lateral aspect of the upper larynx. This mass extends into the upper esophagus Where it measures 4.1 x 2 cm x 3.6 cm. The mass extends into the Laryngeal cartilage laterally.This mass corresponds to a recent uptake and a PET scan. Also seen, and partially necrotic metastatic lymph node at anterior right sternocleidomastoid new muscle. A Recent CT scan of the chest showed further increase in the size of the previously noted pulmonary nodules highly suspicious for progressive disease. The patient also developed new splenic infarcts.  The patient has been admitted for management and further workup.  Summary of oncological history DIAGNOSIS:  1. Stage IB (T2N0M0) right upper lobe non-small cell lung cancer consistent with adenocarcinoma with bronchoalveolar features, diagnosed in November 2006. 2. 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  PRIOR THERAPY:  1. Status post right upper lobectomy in November 2006. 2. Status post left upper lobe wedge resection with seed implants on November 07, 2005, under the care of Dr Arlyce Dice.  CURRENT THERAPY: Observation Scheduled Meds: . enoxaparin (LOVENOX) injection  40 mg Subcutaneous Q24H  . piperacillin-tazobactam (ZOSYN)  IV  3.375 g Intravenous 3 times per day  . sodium chloride  3 mL Intravenous Q12H  . thiamine  100 mg Intravenous Daily   Continuous Infusions: . sodium chloride 75 mL/hr at 10/03/14 0220   PRN Meds:acetaminophen, albuterol, LORazepam, morphine injection, ondansetron (ZOFRAN) IV   Objective:  Filed Vitals:   10/03/14 0516  BP: 122/59  Pulse: 77  Temp: 98.7 F (37.1 C)  Resp: 19      Intake/Output Summary (Last 24 hours) at 10/03/14 6294 Last data filed at 10/03/14 0210  Gross per 24 hour  Intake    150 ml  Output      0 ml  Net    150 ml   GENERAL:alert, no distress, ill appearing SKIN: skin color, texture, turgor are normal, no rashes or significant lesions EYES: normal, conjunctiva are pink and non-injected, sclera clear OROPHARYNX:no exudate, no erythema and lips, buccal mucosa, and tongue normal  NECK: Palpable anterior cervical lymphadenopathy LYMPH:  no palpable lymphadenopathy in the axillary or inguinal LUNGS:bilateral expiratory rhonchi, no rales or wheezing HEART: regular rate & rhythm and no murmurs and no lower extremity edema ABDOMEN:abdomen soft, non-tender and normal bowel sounds Musculoskeletal:no cyanosis of digits and no clubbing  PSYCH: alert & oriented x 3 with fluent  speech NEURO: no focal motor/sensory deficits    CBG (last 3)  No results for input(s): GLUCAP in the last 72 hours.   Labs:   Recent Labs Lab 09/29/14 0936 10/01/14 1231 10/02/14 0539 10/03/14 0440  WBC 19.1* 20.0* 17.7* 13.2*  HGB  15.3 14.9 13.1 12.2*  HCT 44.2 43.9 38.8* 38.7*  PLT 433* 471* 402* 434*  MCV 94.0 96.5 96.8 99.5  MCH 32.6 32.7 32.7 31.4  MCHC 34.6 33.9 33.8 31.5  RDW 13.3 13.3 13.4 13.4  LYMPHSABS 1.0 1.8  --   --   MONOABS 2.3* 1.2*  --   --   EOSABS 0.0 0.0  --   --   BASOSABS 0.0 0.1  --   --      Chemistries:    Recent Labs Lab 09/29/14 0937 10/01/14 1231 10/02/14 0713 10/03/14 0440  NA 135* 137 140 143  K 4.1 3.8 4.0 3.8  CL  --  100 100 104  CO2 29 29 26 27   GLUCOSE 138 140* 80 68*  BUN 21.3 25* 22 23  CREATININE 0.8 0.68 0.66 0.57  CALCIUM 10.0 9.4 9.0 9.0  AST 19 27 33  --   ALT 12 21 22   --   ALKPHOS 127 145* 123*  --   BILITOT 0.90 1.1 0.8  --     GFR Estimated Creatinine Clearance: 73.6 mL/min (by C-G formula based on Cr of 0.57).  Liver Function Tests:  Recent Labs Lab 09/29/14 0937 10/01/14 1231 10/02/14 0713  AST 19 27 33  ALT 12 21 22   ALKPHOS 127 145* 123*  BILITOT 0.90 1.1 0.8  PROT 7.1 7.1 6.3  ALBUMIN 3.0* 3.3* 2.9*    Urine Studies  No results found for: COLORURINE, APPEARANCEUR, LABSPEC, PHURINE, GLUCOSEU, HGBUR, BILIRUBINUR, KETONESUR, PROTEINUR, UROBILINOGEN, NITRITE, LEUKOCYTESUR  Microbiology pending    Imaging Studies:  Dg Chest 2 View  10/01/2014   CLINICAL DATA:  CXR for aspiration into airway. Pt complains of right sided neck swelling, generalized SOB, coughing. Hx of bilateral lung cancer, strabismus, allergic rhinitis, rheumatic fever. Prior RUL lobectomy, LUL wedge resection w/ radioactive seed, Bilateral VAT's resections. Former smoker.  EXAM: CHEST  2 VIEW  COMPARISON:  PET-CT dated 09/07/2014.  FINDINGS: Grossly normal-sized heart. Interval small right pleural effusion.a previously demonstrated left lower lobe lung nodules. Left upper chest surgical clips and staples. Cholecystectomy clips. Old, healed left clavicle fracture.  IMPRESSION: 1. Interval small right pleural effusion. 2. Previously noted left lower lobe nodules.    Electronically Signed   By: Enrique Sack M.D.   On: 10/01/2014 14:11   Ct Soft Tissue Neck W Contrast  10/01/2014   CLINICAL DATA:  Choking on spit, shortness of breath, lung cancer 2006, recently diagnosed with throat cancer with neck mass, difficulty clearing secretions  EXAM: CT NECK WITH CONTRAST  TECHNIQUE: Multidetector CT imaging of the neck was performed using the standard protocol following the bolus administration of intravenous contrast. Sagittal and coronal MPR images reconstructed from axial data set.  CONTRAST:  164mL OMNIPAQUE IOHEXOL 300 MG/ML  SOLN IV  COMPARISON:  PET-CT 09/07/2014  FINDINGS: Pharynx and larynx: Large soft tissue mass identified at the RIGHT lateral aspect of the hypopharynx, 2.7 x 2.7 x 3.6 cm in size and extending into the RIGHT lateral aspect of the upper larynx. Mass extends posterior to the larynx into the upper esophagus where it measures approximately 4.1 x 2.0 cm image 70 extending 3.6 cm length. Mass extends through the laryngeal  cartilage laterally. Mass corresponds to area of hyper metabolism on recent PET-CT.  Salivary glands: Symmetric normal appearing parotid and submandibular glands.  Thyroid: Normal appearing LEFT thyroid lobe. Compression and mild displacement of the upper pole of the RIGHT thyroid lobe by extrinsic fluid collection.  Lymph nodes: Fluid collection containing foci of gas lateral to the RIGHT hypopharynx mass question necrotic versus abscessed lymph node 2.4 x 2.2 x 4.2 cm or potentially an unusual lateral hypo pharyngeal diverticulum. More cranially, a fluid collection with an adjacent enhancing soft tissue nodule is identified the anterior margin of the RIGHT sternocleidomastoid, chronic tumor versus necrotic lymph node, 3.5 x 3.5 x 3.5 cm. Additional normal sized anterior cervical lymph nodes bilaterally.  Vascular: Significant scattered atherosclerotic calcifications in the carotid systems including carotid bifurcations and carotid siphons.  Compression of the RIGHT jugular vein. Major vascular structures appear patent.  Limited intracranial: Unremarkable  Mastoids and visualized paranasal sinuses: Clear. Concha bullosa middle turbinates. Middle ear cavities appear clear bilaterally.  Skeleton: Degenerative disc and facet disease changes cervical spine. No osseous metastases.  Upper chest: 6 mm RIGHT upper lobe nodule image 30 previously 5 mm. Postsurgical changes and anterior LEFT upper lobe with brachytherapy seed implants and scarring. Ground-glass nodule LEFT upper lobe 14 x 10 mm unchanged.  IMPRESSION: Large trans spatial tumor involving the RIGHT lateral aspect the hypopharynx and RIGHT piriform sinus region, upper esophagus, and extending into the RIGHT larynx.  This tumor may either represent a hypo pharyngeal or esophageal origin.  Mass is likely obstructing the esophagus/hypopharynx.  Partially necrotic metastatic lymph node at anterior RIGHT sternocleidomastoid muscle.  Additional gas and fluid collection lateral to the tumor at the level of the upper larynx could represent a necrotic or abscessed nodal disease or an unusual hypo pharyngeal diverticulum.  Minimal increase in size of a RIGHT upper lobe nodule question metastasis.  Stable ground-glass nodule at LEFT apex.   Electronically Signed   By: Lavonia Dana M.D.   On: 10/01/2014 17:01    Patient: Kyle Bauer, Kyle Bauer Collected: 09/22/2014 Client: [facility] Accession: [id number] Received: 09/22/2014 Kyle Gala, Kyle Bauer DOB: 01-Oct-1930 Age: 67 Gender: M Reported: 09/23/2014 [address] Patient Ph: [phone] MRN#: [id number] [place], [place] [address] Client Acc#: Chart: Phone: [phone] Fax: LMP: Visit#: CC: CYTOPATHOLOGY REPORT Adequacy Reason Satisfactory For Evaluation. Diagnosis LYMPH NODE, FINE NEEDLE ASPIRATION, RIGHT LEVEL 3 (SPECIMEN 1 OF 1, COLLECTED ON 09/21/14): POSITIVE FOR SQUAMOUS CELL CARCINOMA. Willeen Niece Kyle Bauer Pathologist,  Electronic Signature (Case signed 09/23/2014)   Assessment/Plan: 81 y.o.  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 The right neck mass was noted on exam. Fine-needle aspiration biopsy of the right neck mass was positive for squamous cell carcinoma (case number is [id number]) CT of the neck showed a large transspatial tumor involving the right hypopharynx and right piriform sinus region, upper esophagus, and extending into the right larynx. Mass is likely obstructing the esophagus/hypopharynx.  A partially necrotic metastatic lymph node at anterior RIGHT sternocleidomastoid muscle was seen.A recent PET scan showed also evidence for large hypermetabolic mass in the right vallecula extending posterior to the arytenoid cartilage and potentially involving the proximal esophagus. ENT is to see the patient today to further investigate Recommendations, which could include palliative surgery, versus radiation versus chemotherapy are to proceed after Dr. Worthy Flank evaluation.  History of Right upper lobe non-small cell lung  carcinoma  He has a history of stage IB non-small cell lung cancer diagnosed in November of 2006 as well as a stage IA non-small cell lung cancer diagnosed in February of 2007 status post right upper lobectomy followed by left upper lobe wedge resection.  The patient has been on observation. Recent CT scan of the chest showed further increase in the size of the previously noted pulmonary nodules highly suspicious for progressive disease. The patient also developed new splenic infarcts.  Recent PET scan showed a large hypermetabolic necrotic right level II lymph node consistent with local nodal metastases in addition to 5 hypermetabolic nodules in the lung suspicious for metastatic disease from head and neck versus lung cancer recurrence. He may need  initiation of chemotherapy while in hospital  Anemia in neoplastic disease Due to malnutrition induced dysphagia, dilution, malignancy No transfusion is indicated at this time Monitor counts closely Transfuse blood to maintain a Hb of 8 g or if the patient is acutely bleeding  Leukocytosis This is due to inflammation, pain, tumor and fever Cultures are pending Continue with IV Zosyn Values are trending to normal No other intervention is indicated at this time Will continue to monitor  Malnutrition In the setting of malignancy, history of alcohol abuse, dyspgia with subsequent weight loss Consider Nutrition evaluation Patient may benefit of tube feeding  Deconditioning Consider OT/PT  History of alcohol abuse He is on thiamine  DVT prophylaxis On Lovenox  Full Code  Other medical issues as per admitting team     **Disclaimer: This note was dictated with voice recognition software. Similar sounding words can inadvertently be transcribed and this note may contain transcription errors which may not have been corrected upon publication of note.** Sharene Butters E, PA-C 10/03/2014  8:22 AM  ADDENDUM: Hematology/Oncology Attending: The patient is seen and examined today. I agree with the above note. He is a very pleasant 81 years old white male with history of stage IB right upper lobe diagnosed in 2006 and a stage IA left upper lobe lung cancer diagnosed in 2007. He is status post resection followed by observation. He has been doing fine recently but was found on imaging studies including PET scan to have evidence for metastatic disease in the lung in addition to large hypermetabolic mass involving the right vallecula and extending posterior to the arytenoid cartilage and potentially involving the proximal esophagus concerning for primary head and neck cancer. There was also hypermetabolic necrotic right level II lymph node consistent with local nodal metastasis and 5 hypermetabolic  nodules in the left lower lobe concerning for metastatic disease. The patient was referred to Dr. Constance Holster and fine needle aspiration of the right neck lymph node was consistent with a squamous cell carcinoma. He was admitted on 10/01/2014 with difficulty swallowing and shortness of breath. He lost around 20 pounds over the last few weeks. His dysphagia started with solid food but now affecting his swallowing of liquids as well. We were consulted to evaluate the patient and give recommendation regarding his condition. I had a lengthy discussion with the patient and his daughters and son-in-law that were at the bedside today. I explained to them that he has stage IV head and neck cancer and the nodules in the lung are most likely related to his disease in the neck. I recommended for the patient to proceed with a short course of radiotherapy to the neck lesion to improve his swallowing and shortness of breath. I will also arrange for the patient to have  a PEG tube placed for nutrition during his course of radiotherapy. His radiotherapy can be done as a single modality or in combination of systemic therapy, but the patient will require palliative systemic chemotherapy later on in his course of treatment. I will consult Dr. Tammi Klippel to see the patient for the radiotherapy. The patient and his family had several questions and I answered them completely to their satisfaction. Thank you for taking good care of Mr. Choyce, I will continue to follow up the patient with you and assist in his management on as-needed basis.

## 2014-10-03 NOTE — Telephone Encounter (Signed)
Asking if Kyle Bauer will see pt today.

## 2014-10-04 ENCOUNTER — Ambulatory Visit
Admit: 2014-10-04 | Discharge: 2014-10-04 | Disposition: A | Discharge status: Home / Self Care | Payer: Medicare Other | Attending: Radiation Oncology | Admitting: Radiation Oncology

## 2014-10-04 DIAGNOSIS — C139 Malignant neoplasm of hypopharynx, unspecified: Secondary | ICD-10-CM

## 2014-10-04 LAB — CBC
HCT: 38.3 % — ABNORMAL LOW (ref 39.0–52.0)
HEMOGLOBIN: 12.6 g/dL — AB (ref 13.0–17.0)
MCH: 32.3 pg (ref 26.0–34.0)
MCHC: 32.9 g/dL (ref 30.0–36.0)
MCV: 98.2 fL (ref 78.0–100.0)
Platelets: 451 10*3/uL — ABNORMAL HIGH (ref 150–400)
RBC: 3.9 MIL/uL — AB (ref 4.22–5.81)
RDW: 13.1 % (ref 11.5–15.5)
WBC: 12.7 10*3/uL — ABNORMAL HIGH (ref 4.0–10.5)

## 2014-10-04 LAB — BASIC METABOLIC PANEL
ANION GAP: 8 (ref 5–15)
BUN: 19 mg/dL (ref 6–23)
CALCIUM: 8.9 mg/dL (ref 8.4–10.5)
CHLORIDE: 104 meq/L (ref 96–112)
CO2: 30 mmol/L (ref 19–32)
Creatinine, Ser: 0.6 mg/dL (ref 0.50–1.35)
GFR calc non Af Amer: >90 mL/min (ref >90)
Glucose, Bld: 116 mg/dL — ABNORMAL HIGH (ref 70–99)
Potassium: 3.5 mmol/L (ref 3.5–5.1)
SODIUM: 142 mmol/L (ref 135–145)

## 2014-10-04 LAB — GLUCOSE, CAPILLARY
GLUCOSE-CAPILLARY: 107 mg/dL — AB (ref 70–99)
GLUCOSE-CAPILLARY: 131 mg/dL — AB (ref 70–99)
GLUCOSE-CAPILLARY: 97 mg/dL (ref 70–99)
Glucose-Capillary: 113 mg/dL — ABNORMAL HIGH (ref 70–99)

## 2014-10-04 LAB — MAGNESIUM: MAGNESIUM: 2 mg/dL (ref 1.5–2.5)

## 2014-10-04 MED ORDER — POTASSIUM CHLORIDE 10 MEQ/100ML IV SOLN
10.0000 meq | INTRAVENOUS | Status: AC
  Administered 2014-10-04 (×4): 10 meq via INTRAVENOUS
  Filled 2014-10-04 (×3): qty 100

## 2014-10-04 NOTE — Consult Note (Signed)
Reason for Consult:  Gastrostomy feeding tube Referring Physician: Bonnielee Haff, MD  Kyle Bauer is an 81 y.o. male.  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 Comes in with mechanical dysphagia due to the mass. Fine-needle aspiration biopsy of the right neck mass was positive for squamous cell carcinoma (case number is [id number]) A CT of the neck 10/01/2014 showed a right hypopharyngeal 2.7 x 2.7 x 3.6 cm mass extending into the right lateral aspect of the upper larynx. This mass extends into the upper esophagus Where it measures 4.1 x 2 cm x 3.6 cm. The mass extends into the Laryngeal cartilage laterally.This mass corresponds to a recent uptake and a PET scan. Also seen, and partially necrotic metastatic lymph node at anterior right sternocleidomastoid new muscle. A Recent CT scan of the chest showed further increase in the size of the previously noted pulmonary nodules highly suspicious for progressive disease. The patient also developed new splenic infarcts.  He is unable to swallow and we are ask to see and surgically place a gastrotomy feeding tube.  IR and GI cannot pass anything thru the esophageus to do procedure.   Past Medical History  Diagnosis Date  . Rheumatic fever age 80  . Allergic rhinitis   . Strabismus   . Cancer     lung ca dx'd 2006    Past Surgical History  Procedure Laterality Date  . Tonsillectomy    . Cholecystectomy    . Right elbow surgery    . Appendectomy    . Total hip arthroplasty      Left  . Colon resection x 2      for perforated colon-colostomy x 6 months  . Lobectomy      RUL-for adenoca T2,N0,MX  . Lul wedge resection      for adenoca with radioactive seeds  . Bilatteral vat's lung resections    . Nasal fracture surgery    . Right arm      Family History  Problem Relation Age of Onset  . Other Father     cardiac arrythmia  . Heart  disease Father     Social History:  reports that he quit smoking about 31 years ago. He does not have any smokeless tobacco history on file. He reports that he drinks alcohol. He reports that he does not use illicit drugs.  Allergies: No Known Allergies  Medications:  Prior to Admission:  Prescriptions prior to admission  Medication Sig Dispense Refill Last Dose  . Aspirin-Acetaminophen (GOODYS BODY PAIN PO) Take 1 packet by mouth every 6 (six) hours as needed (for pain).    10/01/2014 at Unknown time  . fexofenadine (ALLEGRA) 180 MG tablet Take 180 mg by mouth daily.   10/01/2014 at Unknown time  . HYDROcodone-acetaminophen (NORCO) 7.5-325 MG per tablet Take 1 tablet by mouth 3 (three) times daily as needed for moderate pain.   10/01/2014 at Unknown time  . ibuprofen (ADVIL,MOTRIN) 200 MG tablet Take 200 mg by mouth every 6 (six) hours as needed for mild pain.    10/01/2014 at Unknown time  . omeprazole (PRILOSEC) 40 MG capsule Take 40 mg by mouth daily.   10/01/2014 at Unknown time  . UNABLE TO FIND Weekly allergy shots   09/18/2014  . vitamin C (ASCORBIC ACID) 500 MG tablet Take 500 mg by mouth daily.   10/01/2014 at Unknown time   Scheduled: .  antiseptic oral rinse  7 mL Mouth Rinse BID  . enoxaparin (LOVENOX) injection  40 mg Subcutaneous Q24H  . piperacillin-tazobactam (ZOSYN)  IV  3.375 g Intravenous 3 times per day  . potassium chloride  10 mEq Intravenous Q1 Hr x 4  . sodium chloride  3 mL Intravenous Q12H  . thiamine  100 mg Intravenous Daily   Continuous: . dextrose 5 % and 0.45% NaCl 75 mL/hr at 10/04/14 1357   IWP:[id number], albuterol, LORazepam, morphine injection, ondansetron (ZOFRAN) IV Anti-infectives    Start     Dose/Rate Route Frequency Ordered Stop   10/01/14 1730  piperacillin-tazobactam (ZOSYN) IVPB 3.375 g     3.375 g12.5 mL/hr over 240 Minutes Intravenous 3 times per day 10/01/14 1719        Results for orders placed or performed during the hospital  encounter of 10/01/14 (from the past 48 hour(s))  CBC     Status: Abnormal   Collection Time: 10/03/14  4:40 AM  Result Value Ref Range   WBC 13.2 (H) 4.0 - 10.5 K/uL   RBC 3.89 (L) 4.22 - 5.81 MIL/uL   Hemoglobin 12.2 (L) 13.0 - 17.0 g/dL   HCT 38.7 (L) 39.0 - 52.0 %   MCV 99.5 78.0 - 100.0 fL   MCH 31.4 26.0 - 34.0 pg   MCHC 31.5 30.0 - 36.0 g/dL   RDW 13.4 11.5 - 15.5 %   Platelets 434 (H) 150 - 400 K/uL  Basic metabolic panel     Status: Abnormal   Collection Time: 10/03/14  4:40 AM  Result Value Ref Range   Sodium 143 135 - 145 mmol/L    Comment: Please note change in reference range.   Potassium 3.8 3.5 - 5.1 mmol/L    Comment: Please note change in reference range.   Chloride 104 96 - 112 mEq/L   CO2 27 19 - 32 mmol/L   Glucose, Bld 68 (L) 70 - 99 mg/dL   BUN 23 6 - 23 mg/dL   Creatinine, Ser 0.57 0.50 - 1.35 mg/dL   Calcium 9.0 8.4 - 10.5 mg/dL   GFR calc non Af Amer >90 >90 mL/min   GFR calc Af Amer >90 >90 mL/min    Comment: (NOTE) The eGFR has been calculated using the CKD EPI equation. This calculation has not been validated in all clinical situations. eGFR's persistently <90 mL/min signify possible Chronic Kidney Disease.    Anion gap 12 5 - 15  Glucose, capillary     Status: Abnormal   Collection Time: 10/03/14  8:44 AM  Result Value Ref Range   Glucose-Capillary 65 (L) 70 - 99 mg/dL  Glucose, capillary     Status: Abnormal   Collection Time: 10/03/14  9:10 AM  Result Value Ref Range   Glucose-Capillary 134 (H) 70 - 99 mg/dL  Glucose, capillary     Status: None   Collection Time: 10/03/14 11:52 AM  Result Value Ref Range   Glucose-Capillary 89 70 - 99 mg/dL  Glucose, capillary     Status: None   Collection Time: 10/03/14  5:44 PM  Result Value Ref Range   Glucose-Capillary 93 70 - 99 mg/dL  Glucose, capillary     Status: None   Collection Time: 10/03/14 11:47 PM  Result Value Ref Range   Glucose-Capillary 86 70 - 99 mg/dL  Basic metabolic panel      Status: Abnormal   Collection Time: 10/04/14  5:00 AM  Result Value Ref Range   Sodium  142 135 - 145 mmol/L    Comment: Please note change in reference range.   Potassium 3.5 3.5 - 5.1 mmol/L    Comment: Please note change in reference range.   Chloride 104 96 - 112 mEq/L   CO2 30 19 - 32 mmol/L   Glucose, Bld 116 (H) 70 - 99 mg/dL   BUN 19 6 - 23 mg/dL   Creatinine, Ser 0.60 0.50 - 1.35 mg/dL   Calcium 8.9 8.4 - 10.5 mg/dL   GFR calc non Af Amer >90 >90 mL/min   GFR calc Af Amer >90 >90 mL/min    Comment: (NOTE) The eGFR has been calculated using the CKD EPI equation. This calculation has not been validated in all clinical situations. eGFR's persistently <90 mL/min signify possible Chronic Kidney Disease.    Anion gap 8 5 - 15  CBC     Status: Abnormal   Collection Time: 10/04/14  5:00 AM  Result Value Ref Range   WBC 12.7 (H) 4.0 - 10.5 K/uL   RBC 3.90 (L) 4.22 - 5.81 MIL/uL   Hemoglobin 12.6 (L) 13.0 - 17.0 g/dL   HCT 38.3 (L) 39.0 - 52.0 %   MCV 98.2 78.0 - 100.0 fL   MCH 32.3 26.0 - 34.0 pg   MCHC 32.9 30.0 - 36.0 g/dL   RDW 13.1 11.5 - 15.5 %   Platelets 451 (H) 150 - 400 K/uL  Magnesium     Status: None   Collection Time: 10/04/14  5:00 AM  Result Value Ref Range   Magnesium 2.0 1.5 - 2.5 mg/dL  Glucose, capillary     Status: Abnormal   Collection Time: 10/04/14  7:05 AM  Result Value Ref Range   Glucose-Capillary 107 (H) 70 - 99 mg/dL  Glucose, capillary     Status: Abnormal   Collection Time: 10/04/14 12:26 PM  Result Value Ref Range   Glucose-Capillary 131 (H) 70 - 99 mg/dL    No results found.  Review of Systems  HENT:       Pain and discomfort right neck parotid areas  Eyes: Negative.   Skin: Negative.   Neurological: Positive for weakness.  All other systems reviewed and are negative.  Blood pressure 139/55, pulse 61, temperature 99.3 F (37.4 C), temperature source Oral, resp. rate 20, height 5' 9.5" (1.765 m), weight 73.936 kg (163 lb), SpO2  94 %. Physical Exam  Constitutional: He is oriented to person, place, and time.  Elderly WM no acute distress, only complaint is pain and swelling right side of his neck.  HENT:  Head: Normocephalic and atraumatic.  Nose: Nose normal.  Eyes: Conjunctivae and EOM are normal. Right eye exhibits no discharge. Left eye exhibits no discharge. No scleral icterus.  Neck: Normal range of motion. Neck supple. No JVD present. No tracheal deviation present.  Large 5-6 cm mass right anterior neck/parotid area.  Cardiovascular: Normal rate, regular rhythm, normal heart sounds and intact distal pulses.   Respiratory: Effort normal and breath sounds normal. No respiratory distress. He has no wheezes. He has no rales. He exhibits no tenderness.  GI: Soft. Bowel sounds are normal. He exhibits no distension and no mass. There is no tenderness. There is no rebound and no guarding.  Midline incision well healed.  Musculoskeletal: He exhibits no edema.  Neurological: He is alert and oriented to person, place, and time. A cranial nerve deficit is present.  Skin: Skin is warm and dry. No rash noted. No erythema. No pallor.  Psychiatric: He has a normal mood and affect. His behavior is normal. Judgment and thought content normal.    Assessment/Plan: 1.  Right hypopharyngeal  neck mass was positive for squamous cell carcinoma   2.7 x 2.7 x 3.6 cm mass  2.  Right and left upper lobe adenocarcinoma 3.  Hx of colon perforation with colon resection and ostomy reversal 4.  Hx of ETOH abuse 5.  PCM/Dehydration  6.  GERD 7.  Hx of Rheumatic fever.  Plan:  I will make him NPO, he is not eating now because of the mechanical obstruction from the mass.  If possible we will try to get him on for surgery tomorrow.       Margaree Sandhu 10/04/2014, 4:08 PM

## 2014-10-04 NOTE — Progress Notes (Signed)
Pt had 5 beats of V-Tach, [facility] Informed. No new orders. Will continue to monitor

## 2014-10-04 NOTE — Progress Notes (Signed)
IR received request for percutaneous gastrostomy tube placement. I have reviewed the patient's imaging with Dr. Anselm Pancoast today and unfortunately the patient is not a candidate for IR G-tube placement secondary to obstructing hypopharyngeal/esophageal mass. We would need to pass an OG down to inflate the stomach and with this obstructing mass we are unable to do so. We would recommend surgical consult for placement. This has been discussed today with Dr. Julien Nordmann.   Tsosie Billing PA-C Interventional Radiology  10/04/14  9:14 AM

## 2014-10-04 NOTE — Progress Notes (Signed)
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 Comes in with mechanical dysphagia due to the mass. He was admitted for further management. Seen by oncology and interventional radiology. General surgery to see.  Past medical history:  Past Medical History  Diagnosis Date  . Rheumatic fever age 51  . Allergic rhinitis   . Strabismus   . Cancer     lung ca dx'd 2006    Consultants: Discussed with Dr. Constance Holster with ENT. Oncology is following. General surgery consulted for PEG.   Procedures: None yet  Antibiotics: Zosyn 1/16  Subjective: Patient feels about the same. Denies any new complaints.  Objective: Vital Signs  Filed Vitals:   10/03/14 0516 10/03/14 1330 10/03/14 2125 10/04/14 0501  BP: 122/59 148/67 149/76 139/55  Pulse: 77 78 84 61  Temp: 98.7 F (37.1 C) 98.9 F (37.2 C) 98.1 F (36.7 C) 99.3 F (37.4 C)  TempSrc: Oral Oral Oral Oral  Resp: 19 20 20 20   Height:      Weight:      SpO2: 92% 93% 96% 94%    Intake/Output Summary (Last 24 hours) at 10/04/14 0951 Last data filed at 10/04/14 0855  Gross per 24 hour  Intake 1778.75 ml  Output    300 ml  Net 1478.75 ml   Filed Weights   10/01/14 1656  Weight: 73.936 kg (163 lb)    General appearance: alert, cooperative, appears stated age and no distress Resp: Coarse breath sounds bilaterally. No wheezing Rales or rhonchi. Cardio: regular rate and rhythm, S1, S2 normal, no murmur, click, rub or gallop GI: soft, non-tender; bowel sounds normal; no masses,  no organomegaly Extremities: extremities normal, atraumatic, no cyanosis or edema   Lab Results:  Basic Metabolic Panel:  Recent Labs Lab 09/29/14 0937 10/01/14 1231 10/02/14 0713 10/03/14 0440 10/04/14 0500    NA 135* 137 140 143 142  K 4.1 3.8 4.0 3.8 3.5  CL  --  100 100 104 104  CO2 29 29 26 27 30   GLUCOSE 138 140* 80 68* 116*  BUN 21.3 25* 22 23 19   CREATININE 0.8 0.68 0.66 0.57 0.60  CALCIUM 10.0 9.4 9.0 9.0 8.9   Liver Function Tests:  Recent Labs Lab 09/29/14 0937 10/01/14 1231 10/02/14 0713  AST 19 27 33  ALT 12 21 22   ALKPHOS 127 145* 123*  BILITOT 0.90 1.1 0.8  PROT 7.1 7.1 6.3  ALBUMIN 3.0* 3.3* 2.9*   CBC:  Recent Labs Lab 09/29/14 0936 10/01/14 1231 10/02/14 0539 10/03/14 0440 10/04/14 0500  WBC 19.1* 20.0* 17.7* 13.2* 12.7*  NEUTROABS 15.7* 16.9*  --   --   --   HGB 15.3 14.9 13.1 12.2* 12.6*  HCT 44.2 43.9 38.8* 38.7* 38.3*  MCV 94.0 96.5 96.8 99.5 98.2  PLT 433* 471* 402* 434* 451*     Recent Results (from the past 240 hour(s))  Culture, blood (routine x 2)     Status: None (Preliminary result)   Collection Time: 10/01/14  6:26 PM  Result Value Ref Range Status   Specimen Description BLOOD RIGHT HAND  Final   Special Requests BOTTLES DRAWN AEROBIC AND ANAEROBIC 10 CC EACH  Final   Culture   Final           BLOOD  CULTURE RECEIVED NO GROWTH TO DATE CULTURE WILL BE HELD FOR 5 DAYS BEFORE ISSUING A FINAL NEGATIVE REPORT Performed at [facility]    Report Status PENDING  Incomplete  Culture, blood (routine x 2)     Status: None (Preliminary result)   Collection Time: 10/01/14  6:26 PM  Result Value Ref Range Status   Specimen Description BLOOD RIGHT ARM  Final   Special Requests BOTTLES DRAWN AEROBIC ONLY 10 CC  Final   Culture   Final           BLOOD CULTURE RECEIVED NO GROWTH TO DATE CULTURE WILL BE HELD FOR 5 DAYS BEFORE ISSUING A FINAL NEGATIVE REPORT Note: Culture results may be compromised due to an excessive volume of blood received in culture bottles. Performed at [facility]    Report Status PENDING  Incomplete      Studies/Results: No results found.  Medications:  Scheduled: . antiseptic oral rinse  7 mL Mouth  Rinse BID  . enoxaparin (LOVENOX) injection  40 mg Subcutaneous Q24H  . piperacillin-tazobactam (ZOSYN)  IV  3.375 g Intravenous 3 times per day  . potassium chloride  10 mEq Intravenous Q1 Hr x 4  . sodium chloride  3 mL Intravenous Q12H  . thiamine  100 mg Intravenous Daily   Continuous: . dextrose 5 % and 0.45% NaCl 75 mL/hr at 10/04/14 0256   XBM:[id number], albuterol, LORazepam, morphine injection, ondansetron (ZOFRAN) IV  Assessment/Plan:  Principal Problem:   Squamous cell cancer of hypopharynx Active Problems:   Multiple lung nodules   Head and neck cancer   Mechanical dysphagia   Alcohol abuse   Leukocytosis   Dehydration   Weight loss   Protein-calorie malnutrition, severe    Mechanical dysphagia This is secondary to his head and neck cancer in the hypopharynx. CT scan of his neck is as above. There appears to be an obstruction of the esophageal opening. Discussed with Dr. Constance Holster yesterday. Oncology is following. Plan is for Radiation treatment. Since he will be unable to take orally for the foreseeable future a PEG tube will need to be placed for nutritional purposes. This cannot be done by IR due to the mechanical obstruction. So Gen. surgery has been consulted. Continue strict nothing by mouth for now. Keep head of bed elevated.  Fever No further episodes of fever. Blood cultures are negative so far. He was started on Zosyn due to possibility of infection around this tumor. WBC is improved.  Squamous cell cancer of head and neck Radiation oncologist to see. Per my discussions with Dr. Constance Holster yesterday there is no role for palliative surgery. Oncology is following  NSVT Patient remains asymptomatic. He does not have any known history of heart disease. Obtain EKG. Replace potassium. Check magnesium level.  Dehydration and weight loss and hypoglycemia Secondary to dysphagia and poor oral intake. Continue with IV fluids. NPO. Glucose level is improved after IV  fluids were changed to D5.  Leukocytosis This has been present for the last month and a half. Counts are improved with Zosyn.  Alcohol abuse He drinks moderate to significant amounts of beer and wine. No signs of withdrawal. Continue telemetry and CIWA protocol. Thiamine will be given intravenously.  Previous history of lung cancer with new pulmonary nodules Lung cancer was diagnosed originally in 2006/7. He underwent surgery (?pneumonectomy) on both his lungs and had radiation treatment. He's never had chemotherapy. Management per oncology. The pulmonary nodules detected recently could be metastatic process from his  head and neck cancer.  DVT Prophylaxis: Lovenox    Code Status: Full code for now  Family Communication: Discussed with the patient and his daughters  Disposition Plan: Not ready for discharge.     LOS: 3 days   [facility] Pager [phone] 10/04/2014, 9:51 AM  If 7PM-7AM, please contact night-coverage at [email or web address], password [facility]

## 2014-10-04 NOTE — Progress Notes (Signed)
Patient had 5 beats of V.tach and asymptomatic.  MD made aware. Will continue to monitor.

## 2014-10-04 NOTE — Consult Note (Signed)
Radiation Oncology         [phone] ________________________________  Initial inpatient Consultation  Name: Kyle Bauer MRN: [id number]  Date: 10/01/2014  DOB: [birth year]  RJ:JOACZY,SAYTKZ OLIVER, MD  No ref. provider found   REFERRING PHYSICIAN: Curt Bears, M.D. 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    ICD-9-CM ICD-10-CM   1. Head and neck cancer 195.0 C76.0   2. Aspiration into airway 934.9 T17.998A DG Chest 2 View     DG Chest 2 View  3. Malignancies 199.1 C80.1 CT Soft Tissue Neck W Contrast     CT Soft Tissue Neck W Contrast  4. Dysphagia 787.20 R13.10 CANCELED: DG Perc Gastrostomy Tube Insert W/Fluoro     CANCELED: DG Perc Gastrostomy Tube Insert W/Fluoro     CANCELED: IR GASTROSTOMY TUBE MOD SED     CANCELED: IR GASTROSTOMY TUBE MOD SED    HISTORY OF PRESENT ILLNESS::Kyle Bauer is a 81 y.o. male who was previously treated for early stage lung cancer in 2007.  He has been under the care of Dr. Earlie Server over the preceding years and noted to have enlarging lung nodules on CT.  A PET CT was performed on 09/07/2014 in order to characterize the lung nodules. However, the dominant finding was a large hypermetabolic mass involving the right vallecula extending posterior to the arytenoid cartilage to potentially involve the proximal esophagus. In addition, there was a hypermetabolic necrotic right level II lymph node. With regard to the lung nodules, the patient did have at least 5 hypermetabolic nodules in the left lower lobe suggesting metastatic cancer as well as a single hypermetabolic right upper lung nodule. He was referred to otolaryngology with Dr. Constance Holster and biopsy in the office revealed squamous cell carcinoma. The patient's hypopharyngeal mass has grown to obstruct his esophagus leading to a 20 pound weight loss. The patient has been admitted to the hospital now for further  evaluation and treatment. He has been referred for possible palliative radiation treatment  PREVIOUS RADIATION THERAPY: Yes low dose rate lung brachytherapy in 2007 with seed implants following wedge resection.  PAST MEDICAL HISTORY:  has a past medical history of Rheumatic fever (age 66); Allergic rhinitis; Strabismus; and Cancer.    PAST SURGICAL HISTORY: Past Surgical History  Procedure Laterality Date  . Tonsillectomy    . Cholecystectomy    . Right elbow surgery    . Appendectomy    . Total hip arthroplasty      Left  . Colon resection x 2      for perforated colon-colostomy x 6 months  . Lobectomy      RUL-for adenoca T2,N0,MX  . Lul wedge resection      for adenoca with radioactive seeds  . Bilatteral vat's lung resections    . Nasal fracture surgery    . Right arm      FAMILY HISTORY: family history includes Heart disease in his father; Other in his father.  SOCIAL HISTORY:  reports that he quit smoking about 31 years ago. He does not have any smokeless tobacco history on file. He reports that he drinks alcohol. He reports that he does not use illicit drugs.  ALLERGIES: Review of patient's allergies indicates no known allergies.  MEDICATIONS:  Current Facility-Administered Medications  Medication Dose Route Frequency Provider Last Rate Last Dose  . acetaminophen (TYLENOL) suppository 650 mg  650 mg Rectal Q4H PRN Bonnielee Haff, MD   [phone]  mg at 10/01/14 1737  . albuterol (PROVENTIL) (2.5 MG/3ML) 0.083% nebulizer solution 2.5 mg  2.5 mg Nebulization Q2H PRN Bonnielee Haff, MD      . antiseptic oral rinse (CPC / CETYLPYRIDINIUM CHLORIDE 0.05%) solution 7 mL  7 mL Mouth Rinse BID Lind Guest, RN   7 mL at 10/04/14 1000  . dextrose 5 %-0.45 % sodium chloride infusion   Intravenous Continuous Bonnielee Haff, MD 75 mL/hr at 10/04/14 0256    . enoxaparin (LOVENOX) injection 40 mg  40 mg Subcutaneous Q24H Bonnielee Haff, MD   40 mg at 10/03/14 1746  . LORazepam (ATIVAN)  injection 1 mg  1 mg Intravenous Q6H PRN Bonnielee Haff, MD   1 mg at 10/03/14 2671  . morphine 2 MG/ML injection 2-4 mg  2-4 mg Intravenous Q2H PRN Bonnielee Haff, MD   2 mg at 10/04/14 1348  . ondansetron (ZOFRAN) injection 4 mg  4 mg Intravenous Q6H PRN Bonnielee Haff, MD      . piperacillin-tazobactam (ZOSYN) IVPB 3.375 g  3.375 g Intravenous 3 times per day Bonnielee Haff, MD   3.375 g at 10/04/14 0855  . potassium chloride 10 mEq in 100 mL IVPB  10 mEq Intravenous Q1 Hr x 4 Bonnielee Haff, MD   10 mEq at 10/04/14 1348  . sodium chloride 0.9 % injection 3 mL  3 mL Intravenous Q12H Bonnielee Haff, MD   3 mL at 10/01/14 2200  . thiamine (B-1) injection 100 mg  100 mg Intravenous Daily Bonnielee Haff, MD   100 mg at 10/04/14 0850    REVIEW OF SYSTEMS:  A 15 point review of systems is documented in the electronic medical record. This was obtained by the nursing staff. However, I reviewed this with the patient to discuss relevant findings and make appropriate changes.  Pertinent items are noted in HPI.   PHYSICAL EXAM:  height is 5' 9.5" (1.765 m) and weight is 163 lb (73.936 kg). His oral temperature is 99.3 F (37.4 C). His blood pressure is 139/55 and his pulse is 61. His respiration is 20 and oxygen saturation is 94%.   Per hospitalist General appearance: alert, cooperative, appears stated age and no distress Resp: Coarse breath sounds bilaterally. No wheezing Rales or rhonchi. Cardio: regular rate and rhythm, S1, S2 normal, no murmur, click, rub or gallop GI: soft, non-tender; bowel sounds normal; no masses, no organomegaly Extremities: extremities normal, atraumatic, no cyanosis or edema On my inspection today, I concur with the findings above and also note a fluctuant 3 cm lymph node in the right level II neck which is somewhat mobile with no overlying skin involvement.  KPS = 50  100 - Normal; no complaints; no evidence of disease. 90   - Able to carry on normal activity; minor signs  or symptoms of disease. 80   - Normal activity with effort; some signs or symptoms of disease. 72   - Cares for self; unable to carry on normal activity or to do active work. 60   - Requires occasional assistance, but is able to care for most of his personal needs. 50   - Requires considerable assistance and frequent medical care. 4   - Disabled; requires special care and assistance. 25   - Severely disabled; hospital admission is indicated although death not imminent. 21   - Very sick; hospital admission necessary; active supportive treatment necessary. 10   - Moribund; fatal processes progressing rapidly. 0     - Dead  Karnofsky DA, Abelmann WH, Craver LS and Belterra JH [phone]) The use of the nitrogen mustards in the palliative treatment of carcinoma: with particular reference to bronchogenic carcinoma Cancer 1 634-56  LABORATORY DATA:  Lab Results  Component Value Date   WBC 12.7* 10/04/2014   HGB 12.6* 10/04/2014   HCT 38.3* 10/04/2014   MCV 98.2 10/04/2014   PLT 451* 10/04/2014   Lab Results  Component Value Date   NA 142 10/04/2014   K 3.5 10/04/2014   CL 104 10/04/2014   CO2 30 10/04/2014   Lab Results  Component Value Date   ALT 22 10/02/2014   AST 33 10/02/2014   ALKPHOS 123* 10/02/2014   BILITOT 0.8 10/02/2014     RADIOGRAPHY: Dg Chest 2 View  10/01/2014   CLINICAL DATA:  CXR for aspiration into airway. Pt complains of right sided neck swelling, generalized SOB, coughing. Hx of bilateral lung cancer, strabismus, allergic rhinitis, rheumatic fever. Prior RUL lobectomy, LUL wedge resection w/ radioactive seed, Bilateral VAT's resections. Former smoker.  EXAM: CHEST  2 VIEW  COMPARISON:  PET-CT dated 09/07/2014.  FINDINGS: Grossly normal-sized heart. Interval small right pleural effusion.a previously demonstrated left lower lobe lung nodules. Left upper chest surgical clips and staples. Cholecystectomy clips. Old, healed left clavicle fracture.  IMPRESSION: 1. Interval  small right pleural effusion. 2. Previously noted left lower lobe nodules.   Electronically Signed   By: Enrique Sack M.D.   On: 10/01/2014 14:11   Ct Soft Tissue Neck W Contrast  10/01/2014   CLINICAL DATA:  Choking on spit, shortness of breath, lung cancer 2006, recently diagnosed with throat cancer with neck mass, difficulty clearing secretions  EXAM: CT NECK WITH CONTRAST  TECHNIQUE: Multidetector CT imaging of the neck was performed using the standard protocol following the bolus administration of intravenous contrast. Sagittal and coronal MPR images reconstructed from axial data set.  CONTRAST:  155mL OMNIPAQUE IOHEXOL 300 MG/ML  SOLN IV  COMPARISON:  PET-CT 09/07/2014  FINDINGS: Pharynx and larynx: Large soft tissue mass identified at the RIGHT lateral aspect of the hypopharynx, 2.7 x 2.7 x 3.6 cm in size and extending into the RIGHT lateral aspect of the upper larynx. Mass extends posterior to the larynx into the upper esophagus where it measures approximately 4.1 x 2.0 cm image 70 extending 3.6 cm length. Mass extends through the laryngeal cartilage laterally. Mass corresponds to area of hyper metabolism on recent PET-CT.  Salivary glands: Symmetric normal appearing parotid and submandibular glands.  Thyroid: Normal appearing LEFT thyroid lobe. Compression and mild displacement of the upper pole of the RIGHT thyroid lobe by extrinsic fluid collection.  Lymph nodes: Fluid collection containing foci of gas lateral to the RIGHT hypopharynx mass question necrotic versus abscessed lymph node 2.4 x 2.2 x 4.2 cm or potentially an unusual lateral hypo pharyngeal diverticulum. More cranially, a fluid collection with an adjacent enhancing soft tissue nodule is identified the anterior margin of the RIGHT sternocleidomastoid, chronic tumor versus necrotic lymph node, 3.5 x 3.5 x 3.5 cm. Additional normal sized anterior cervical lymph nodes bilaterally.  Vascular: Significant scattered atherosclerotic calcifications  in the carotid systems including carotid bifurcations and carotid siphons. Compression of the RIGHT jugular vein. Major vascular structures appear patent.  Limited intracranial: Unremarkable  Mastoids and visualized paranasal sinuses: Clear. Concha bullosa middle turbinates. Middle ear cavities appear clear bilaterally.  Skeleton: Degenerative disc and facet disease changes cervical spine. No osseous metastases.  Upper chest: 6 mm RIGHT upper lobe nodule image  30 previously 5 mm. Postsurgical changes and anterior LEFT upper lobe with brachytherapy seed implants and scarring. Ground-glass nodule LEFT upper lobe 14 x 10 mm unchanged.  IMPRESSION: Large trans spatial tumor involving the RIGHT lateral aspect the hypopharynx and RIGHT piriform sinus region, upper esophagus, and extending into the RIGHT larynx.  This tumor may either represent a hypo pharyngeal or esophageal origin.  Mass is likely obstructing the esophagus/hypopharynx.  Partially necrotic metastatic lymph node at anterior RIGHT sternocleidomastoid muscle.  Additional gas and fluid collection lateral to the tumor at the level of the upper larynx could represent a necrotic or abscessed nodal disease or an unusual hypo pharyngeal diverticulum.  Minimal increase in size of a RIGHT upper lobe nodule question metastasis.  Stable ground-glass nodule at LEFT apex.   Electronically Signed   By: Lavonia Dana M.D.   On: 10/01/2014 17:01   Mr Jeri Cos WU Contrast  09/22/2014   CLINICAL DATA:  Left lung cancer.  Restaging.  EXAM: MRI HEAD WITHOUT AND WITH CONTRAST  TECHNIQUE: Multiplanar, multiecho pulse sequences of the brain and surrounding structures were obtained without and with intravenous contrast.  CONTRAST:  105mL MULTIHANCE GADOBENATE DIMEGLUMINE 529 MG/ML IV SOLN  COMPARISON:  CT head 08/02/2005  FINDINGS: Moderate atrophy. Negative for hydrocephalus. Pituitary not enlarged. Craniocervical junction normal. Calvarium intact.  Negative for acute infarct.   Chronic microvascular ischemic changes. Periventricular and deep white matter hyperintensity throughout the cerebral white matter bilaterally. Hyperintensity in the pons bilaterally. Small chronic infarcts in the right cerebellum.  Negative for hemorrhage  Negative for mass or edema  No enhancing lesions are seen postcontrast administration.  Paranasal sinuses are clear.  IMPRESSION: Negative for metastatic disease.  Atrophy and chronic microvascular ischemia.  No acute infarct.   Electronically Signed   By: Franchot Gallo M.D.   On: 09/22/2014 11:10   Nm Pet Image Initial (pi) Skull Base To Thigh  09/07/2014   CLINICAL DATA:  Subsequent treatment strategy for lung cancer.  EXAM: NUCLEAR MEDICINE PET SKULL BASE TO THIGH  TECHNIQUE: 9.4 mCi F-18 FDG was injected intravenously. Full-ring PET imaging was performed from the skull base to thigh after the radiotracer. CT data was obtained and used for attenuation correction and anatomic localization.  FASTING BLOOD GLUCOSE:  Value: 113 mg/dl  COMPARISON:  CT 08/25/2014, PET-CT 06/27/2005  FINDINGS: NECK  There is a hypermetabolic mass involving the right vallecula and extending inferiorly to involve tissue posterior to the arytenoid cartilage at the level glottis. Hypermetabolic mass crosses midline posterior to the arytenoid cartilage and is inseparable from the proximal esophagus. The mass is intensely hypermetabolic with SUV max = 27. Mass at the level of the right vallecula measures 2.5 x 3.5 cm . Posterior to the arytenoid cartilage mass measures 3.2 x 2.5 cm.  There is an large hypermetabolic necrotic lymph node in the right level IIa position measuring 3.1 cm with SUV max 11.9. Smaller hypermetabolic level 2 lymph nodes on the right.  CHEST  Within the left lower lobe there 5 rounded pulmonary nodules measuring 10 to 13 mm. These nodules have associated metabolic activity with SUV max equal 4-5. Single hypermetabolic nodule in the right upper lobe on image 19,  series 3 measures 6 mm. There is postsurgical change in the left upper lobe with brachytherapy seeds. No significant metabolic activity within the surgical / treatment site left upper lobe.  ABDOMEN/PELVIS  No abnormal hypermetabolic activity within the liver, pancreas, adrenal glands, or spleen. No hypermetabolic lymph  nodes in the abdomen or pelvis.  SKELETON  No focal hypermetabolic activity to suggest skeletal metastasis.  IMPRESSION: 1. Dominant finding is a large hypermetabolic mass involving the right vallecula and extending posterior to the arytenoid cartilage to potentially involve the proximal esophagus. Concern for primary head and neck cancer. Recommend direct visual evaluation and consider CT of the neck with contrast for more detailed anatomy.  2. Hypermetabolic necrotic right level II lymph nodes consistent with local nodal metastasis.  3. Five hypermetabolic nodules within the left lower lobe with differential including metastatic disease versus lung cancer recurrence.  4. Single hypermetabolic right upper lobe nodule is concerning for metastasis.  Findings conveyed toMOHAMED MOHAMED on 09/07/2014  at11:22.   Electronically Signed   By: Suzy Bouchard M.D.   On: 09/07/2014 11:22      IMPRESSION: This patient is a very nice 81 year old gentleman with stage IV squamous cell carcinoma hypopharynx with obstruction of the esophagus leading to nutritional deficiencies. The patient requires a feeding strategy and may require general surgery consultation for placement of a feeding tube. In addition, the patient would benefit from palliative radiotherapy to the primary hypopharyngeal cancer along with regional adenopathy in order to potentially palliate nutritional issues and prevent airway obstruction and hemorrhage if the tumor progresses. From the standpoint, the degree to which palliation is critical for the patient's ongoing comfort and quality of life, I would suggest a protracted course of  radiation for more durable local control.  PLAN:Today, I talked to the patient and family about the findings and work-up thus far.  We discussed the natural history of locally advanced and metastatic hypopharyngeal cancer and general treatment, highlighting the role of radiotherapy in the management.  We discussed the available radiation techniques, and focused on the details of logistics and delivery.  We reviewed the anticipated acute and late sequelae associated with radiation in this setting.  The patient was encouraged to ask questions that I answered to the best of my ability.  I filled out a patient counseling form during our discussion including treatment diagrams.  We retained a copy for our records.  The patient would like to proceed with radiation and will be scheduled for CT simulation.  I spent 30 minutes minutes face to face with the patient and more than 50% of that time was spent in counseling and/or coordination of care.   ------------------------------------------------  Sheral Apley. Tammi Klippel, M.D.

## 2014-10-04 NOTE — Progress Notes (Signed)
  Radiation Oncology         (336) [phone] ________________________________  Name: Kyle Bauer MRN: [id number]  Date: 10/01/2014  DOB: [birth year]   INPATIENT   SIMULATION AND TREATMENT PLANNING NOTE    ICD-9-CM ICD-10-CM   1. Head and neck cancer 195.0 C76.0   2. Aspiration into airway 934.9 T17.998A DG Chest 2 View     DG Chest 2 View  3. Malignancies 199.1 C80.1 CT Soft Tissue Neck W Contrast     CT Soft Tissue Neck W Contrast  4. Dysphagia 787.20 R13.10 CANCELED: DG Perc Gastrostomy Tube Insert W/Fluoro     CANCELED: DG Perc Gastrostomy Tube Insert W/Fluoro     CANCELED: IR GASTROSTOMY TUBE MOD SED     CANCELED: IR GASTROSTOMY TUBE MOD SED    DIAGNOSIS:  81 year old gentleman with squamous cell carcinoma hypopharynx with nodal involvement.  NARRATIVE:  The patient was brought to the [facility].  Identity was confirmed.  All relevant records and images related to the planned course of therapy were reviewed.  The patient freely provided informed written consent to proceed with treatment after reviewing the details related to the planned course of therapy. The consent form was witnessed and verified by the simulation staff.  Then, the patient was set-up in a stable reproducible  supine position for radiation therapy.  CT images were obtained.  Surface markings were placed.  The CT images were loaded into the planning software.  Then the target and avoidance structures were contoured.  Treatment planning then occurred.  The radiation prescription was entered and confirmed.  Then, I designed and supervised the construction of a total of 1 medically necessary complex treatment device in the form of a thermoplastic mask for immobilization and precise treatment positioning..  I have requested : Intensity Modulated Radiotherapy (IMRT) is medically necessary for this case for the following reason:  Parotid sparing..  I have ordered:Nutrition Consult  PLAN:  The patient will  receive 68.1 Gy in 30 fraction.  ________________________________  Sheral Apley Tammi Klippel, M.D.

## 2014-10-05 ENCOUNTER — Encounter (HOSPITAL_COMMUNITY): Payer: Self-pay | Admitting: Anesthesiology

## 2014-10-05 ENCOUNTER — Encounter (HOSPITAL_COMMUNITY)
Admission: EM | Disposition: A | Discharge status: Home Health | Payer: Self-pay | Source: Home / Self Care | Attending: Family Medicine

## 2014-10-05 ENCOUNTER — Inpatient Hospital Stay (HOSPITAL_COMMUNITY): Payer: Medicare Other | Admitting: Anesthesiology

## 2014-10-05 DIAGNOSIS — T17998D Other foreign object in respiratory tract, part unspecified causing other injury, subsequent encounter: Secondary | ICD-10-CM

## 2014-10-05 DIAGNOSIS — R1310 Dysphagia, unspecified: Secondary | ICD-10-CM

## 2014-10-05 HISTORY — PX: LAPAROTOMY: SHX154

## 2014-10-05 LAB — BASIC METABOLIC PANEL
Anion gap: 9 (ref 5–15)
BUN: 13 mg/dL (ref 6–23)
CO2: 29 mmol/L (ref 19–32)
Calcium: 8.8 mg/dL (ref 8.4–10.5)
Chloride: 100 mEq/L (ref 96–112)
Creatinine, Ser: 0.59 mg/dL (ref 0.50–1.35)
GFR calc Af Amer: >90 mL/min (ref >90)
GFR calc non Af Amer: >90 mL/min (ref >90)
Glucose, Bld: 121 mg/dL — ABNORMAL HIGH (ref 70–99)
Potassium: 3.5 mmol/L (ref 3.5–5.1)
Sodium: 138 mmol/L (ref 135–145)

## 2014-10-05 LAB — GLUCOSE, CAPILLARY
GLUCOSE-CAPILLARY: 117 mg/dL — AB (ref 70–99)
GLUCOSE-CAPILLARY: 106 mg/dL — AB (ref 70–99)
GLUCOSE-CAPILLARY: 109 mg/dL — AB (ref 70–99)
Glucose-Capillary: 111 mg/dL — ABNORMAL HIGH (ref 70–99)

## 2014-10-05 LAB — CBC
HCT: 38.3 % — ABNORMAL LOW (ref 39.0–52.0)
Hemoglobin: 12.8 g/dL — ABNORMAL LOW (ref 13.0–17.0)
MCH: 32.4 pg (ref 26.0–34.0)
MCHC: 33.4 g/dL (ref 30.0–36.0)
MCV: 97 fL (ref 78.0–100.0)
Platelets: 478 10*3/uL — ABNORMAL HIGH (ref 150–400)
RBC: 3.95 MIL/uL — AB (ref 4.22–5.81)
RDW: 13.1 % (ref 11.5–15.5)
WBC: 13.7 10*3/uL — AB (ref 4.0–10.5)

## 2014-10-05 LAB — SURGICAL PCR SCREEN
MRSA, PCR: NEGATIVE
Staphylococcus aureus: NEGATIVE

## 2014-10-05 LAB — MAGNESIUM: Magnesium: 2 mg/dL (ref 1.5–2.5)

## 2014-10-05 SURGERY — INSERTION OF GASTROSTOMY TUBE
Anesthesia: General

## 2014-10-05 SURGERY — LAPAROTOMY, EXPLORATORY
Anesthesia: General

## 2014-10-05 MED ORDER — 0.9 % SODIUM CHLORIDE (POUR BTL) OPTIME
TOPICAL | Status: DC | PRN
  Administered 2014-10-05: 2000 mL

## 2014-10-05 MED ORDER — LIDOCAINE HCL 4 % IJ SOLN
INTRAMUSCULAR | Status: DC | PRN
  Administered 2014-10-05: 50 mL

## 2014-10-05 MED ORDER — SUCCINYLCHOLINE CHLORIDE 20 MG/ML IJ SOLN
INTRAMUSCULAR | Status: DC | PRN
  Administered 2014-10-05: 50 mg via INTRAVENOUS

## 2014-10-05 MED ORDER — OXYCODONE HCL 5 MG PO TABS
5.0000 mg | ORAL_TABLET | ORAL | Status: DC | PRN
Start: 2014-10-05 — End: 2014-10-11
  Administered 2014-10-07 – 2014-10-11 (×15): 10 mg via ORAL
  Filled 2014-10-05 (×15): qty 2

## 2014-10-05 MED ORDER — MIDAZOLAM HCL 5 MG/5ML IJ SOLN
INTRAMUSCULAR | Status: DC | PRN
Start: 2014-10-05 — End: 2014-10-05
  Administered 2014-10-05: 0.5 mg via INTRAVENOUS
  Administered 2014-10-05: 1 mg via INTRAVENOUS

## 2014-10-05 MED ORDER — ONDANSETRON HCL 4 MG/2ML IJ SOLN
INTRAMUSCULAR | Status: AC
  Filled 2014-10-05: qty 2

## 2014-10-05 MED ORDER — LACTATED RINGERS IV SOLN
INTRAVENOUS | Status: DC
  Administered 2014-10-05: 1000 mL via INTRAVENOUS
  Administered 2014-10-05: 16:00:00 via INTRAVENOUS

## 2014-10-05 MED ORDER — FENTANYL CITRATE 0.05 MG/ML IJ SOLN
INTRAMUSCULAR | Status: AC
  Filled 2014-10-05: qty 5

## 2014-10-05 MED ORDER — PROPOFOL 10 MG/ML IV BOLUS
INTRAVENOUS | Status: DC | PRN
  Administered 2014-10-05: 50 mg via INTRAVENOUS
  Administered 2014-10-05: 30 mg via INTRAVENOUS

## 2014-10-05 MED ORDER — GLYCOPYRROLATE 0.2 MG/ML IJ SOLN
INTRAMUSCULAR | Status: DC | PRN
  Administered 2014-10-05: 0.2 mg via INTRAVENOUS

## 2014-10-05 MED ORDER — LIDOCAINE HCL (CARDIAC) 20 MG/ML IV SOLN
INTRAVENOUS | Status: AC
  Filled 2014-10-05: qty 5

## 2014-10-05 MED ORDER — MIDAZOLAM HCL 2 MG/2ML IJ SOLN
INTRAMUSCULAR | Status: AC
  Filled 2014-10-05: qty 2

## 2014-10-05 MED ORDER — DEXAMETHASONE SODIUM PHOSPHATE 10 MG/ML IJ SOLN
INTRAMUSCULAR | Status: DC | PRN
  Administered 2014-10-05: 10 mg via INTRAVENOUS

## 2014-10-05 MED ORDER — LIDOCAINE HCL (PF) 2 % IJ SOLN
INTRAMUSCULAR | Status: AC
  Filled 2014-10-05: qty 10

## 2014-10-05 MED ORDER — LABETALOL HCL 5 MG/ML IV SOLN
INTRAVENOUS | Status: DC | PRN
  Administered 2014-10-05: 5 mg via INTRAVENOUS

## 2014-10-05 MED ORDER — LIDOCAINE HCL 2 % IJ SOLN
INTRAMUSCULAR | Status: AC
  Filled 2014-10-05: qty 20

## 2014-10-05 MED ORDER — PROPOFOL 10 MG/ML IV BOLUS
INTRAVENOUS | Status: AC
  Filled 2014-10-05: qty 20

## 2014-10-05 MED ORDER — ONDANSETRON HCL 4 MG/2ML IJ SOLN
INTRAMUSCULAR | Status: DC | PRN
  Administered 2014-10-05: 4 mg via INTRAVENOUS

## 2014-10-05 MED ORDER — FENTANYL CITRATE 0.05 MG/ML IJ SOLN
INTRAMUSCULAR | Status: DC | PRN
  Administered 2014-10-05 (×2): 50 ug via INTRAVENOUS

## 2014-10-05 MED ORDER — HYDROMORPHONE HCL 1 MG/ML IJ SOLN: 0.2500 mg | INTRAMUSCULAR | Status: DC | PRN

## 2014-10-05 MED ORDER — LIDOCAINE HCL (PF) 2 % IJ SOLN
3.0000 mL | Freq: Once | INTRAMUSCULAR | Status: AC
  Administered 2014-10-05: 3 mL
  Filled 2014-10-05: qty 4

## 2014-10-05 SURGICAL SUPPLY — 52 items
BLADE EXTENDED COATED 6.5IN (ELECTRODE) ×3 IMPLANT
BLADE HEX COATED 2.75 (ELECTRODE) ×3 IMPLANT
BLADE SURG SZ10 CARB STEEL (BLADE) ×3 IMPLANT
CHLORAPREP W/TINT 26ML (MISCELLANEOUS) ×3 IMPLANT
CLIP TI LARGE 6 (CLIP) IMPLANT
COVER MAYO STAND STRL (DRAPES) IMPLANT
DRAPE LAPAROSCOPIC ABDOMINAL (DRAPES) ×3 IMPLANT
DRAPE SHEET LG 3/4 BI-LAMINATE (DRAPES) IMPLANT
DRAPE UTILITY XL STRL (DRAPES) ×3 IMPLANT
DRAPE WARM FLUID 44X44 (DRAPE) ×3 IMPLANT
ELECT REM PT RETURN 9FT ADLT (ELECTROSURGICAL) ×3
ELECTRODE REM PT RTRN 9FT ADLT (ELECTROSURGICAL) ×1 IMPLANT
GAUZE SPONGE 4X4 12PLY STRL (GAUZE/BANDAGES/DRESSINGS) ×3 IMPLANT
GLOVE BIO SURGEON STRL SZ 6.5 (GLOVE) ×2 IMPLANT
GLOVE BIO SURGEONS STRL SZ 6.5 (GLOVE) ×1
GLOVE BIOGEL PI IND STRL 7.0 (GLOVE) ×1 IMPLANT
GLOVE BIOGEL PI INDICATOR 7.0 (GLOVE) ×2
GOWN L4 XXLG W/PAP TWL (GOWN DISPOSABLE) ×3 IMPLANT
GOWN SPEC L4 XLG W/TWL (GOWN DISPOSABLE) ×12 IMPLANT
KIT BASIN OR (CUSTOM PROCEDURE TRAY) ×3 IMPLANT
LEGGING LITHOTOMY PAIR STRL (DRAPES) IMPLANT
LIGASURE IMPACT 36 18CM CVD LR (INSTRUMENTS) IMPLANT
NS IRRIG 1000ML POUR BTL (IV SOLUTION) ×6 IMPLANT
PACK GENERAL/GYN (CUSTOM PROCEDURE TRAY) ×3 IMPLANT
SHEARS HARMONIC ACE PLUS 36CM (ENDOMECHANICALS) IMPLANT
STAPLER VISISTAT 35W (STAPLE) IMPLANT
SUCTION POOLE TIP (SUCTIONS) ×3 IMPLANT
SUT ETHILON 2 0 PS N (SUTURE) ×3 IMPLANT
SUT NOV 1 T60/GS (SUTURE) IMPLANT
SUT NOVA NAB GS-21 0 18 T12 DT (SUTURE) IMPLANT
SUT NOVA NAB GS-22 2 0 T19 (SUTURE) IMPLANT
SUT PDS AB 1 CT1 27 (SUTURE) ×6 IMPLANT
SUT PDS AB 1 CTX 36 (SUTURE) IMPLANT
SUT PDS AB 1 TP1 96 (SUTURE) IMPLANT
SUT PROLENE 2 0 SH DA (SUTURE) ×3 IMPLANT
SUT SILK 2 0 (SUTURE) ×2
SUT SILK 2 0 SH CR/8 (SUTURE) ×3 IMPLANT
SUT SILK 2 0SH CR/8 30 (SUTURE) IMPLANT
SUT SILK 2-0 18XBRD TIE 12 (SUTURE) ×1 IMPLANT
SUT SILK 2-0 30XBRD TIE 12 (SUTURE) IMPLANT
SUT SILK 3 0 (SUTURE) ×2
SUT SILK 3 0 SH CR/8 (SUTURE) ×3 IMPLANT
SUT SILK 3-0 18XBRD TIE 12 (SUTURE) ×1 IMPLANT
SUT VIC AB 0 UR5 27 (SUTURE) ×6 IMPLANT
SUT VIC AB 3-0 54XBRD REEL (SUTURE) IMPLANT
SUT VIC AB 3-0 BRD 54 (SUTURE)
SUT VIC AB 4-0 PS2 18 (SUTURE) ×3 IMPLANT
TAPE CLOTH SURG 6X10 WHT LF (GAUZE/BANDAGES/DRESSINGS) ×3 IMPLANT
TOWEL OR 17X26 10 PK STRL BLUE (TOWEL DISPOSABLE) ×3 IMPLANT
TRAY FOLEY CATH 14FRSI W/METER (CATHETERS) IMPLANT
TUBE BOLUS G TUBE 26FR (CATHETERS) ×3 IMPLANT
YANKAUER SUCT BULB TIP NO VENT (SUCTIONS) ×3 IMPLANT

## 2014-10-05 NOTE — Transfer of Care (Signed)
Immediate Anesthesia Transfer of Care Note  Patient: Kyle Bauer  Procedure(s) Performed: Procedure(s) with comments: open gastrostomy tube (N/A) - open G-tube  Patient Location: PACU  Anesthesia Type:General  Level of Consciousness: awake, alert , oriented and patient cooperative  Airway & Oxygen Therapy: Patient Spontanous Breathing and Patient connected to face mask oxygen  Post-op Assessment: Report given to PACU RN, Post -op Vital signs reviewed and stable and Patient moving all extremities X 4  Post vital signs: stable  Complications: No apparent anesthesia complications

## 2014-10-05 NOTE — Op Note (Signed)
10/01/2014 - 10/05/2014  4:08 PM  PATIENT:  Kyle Bauer  81 y.o. male  Patient Care Team: Leonard Downing, MD as PCP - General  PRE-OPERATIVE DIAGNOSIS:  Carcinoma of the esophagus, obstructing dysphagia   POST-OPERATIVE DIAGNOSIS:  carcinoma of esophagus, obstructing dysphagia   PROCEDURE:  Open gastrostomy tube placement    Surgeon(s): Leighton Ruff, MD  ASSISTANT: none   ANESTHESIA:   general  EBL:  Total I/O In: 1000 [I.V.:1000] Out: -   DRAINS: Gastrostomy Tube   SPECIMEN:  No Specimen  DISPOSITION OF SPECIMEN:  N/A  COUNTS:  YES  PLAN OF CARE: patient admitted  PATIENT DISPOSITION:  PACU - hemodynamically stable.  INDICATION: This is a 81 y.o. M with an obstructing SCC invading his proximal esophagus.  His is unable to tolerate PO and a G tube placement was requested.     OR FINDINGS: Omental adhesions, ventral mesh.  Normal appearing gastric body.  No peritoneal mets appreciated.    DESCRIPTION: the patient was identified in the preoperative holding area and taken to the OR where they were laid supine on the operating room table.  General anesthesia was induced.  SCDs were also noted to be in place prior to the initiation of anesthesia.  The patient was then prepped and draped in the usual sterile fashion.   A surgical timeout was performed indicating the correct patient, procedure, positioning and need for preoperative antibiotics.   I began by making an upper midline incision through his previous scar using a 10 blade scalpel.  This was carried down to the fascia, where I encounter hernia mesh.  I incised this with scissors.  I bluntly took down the dense omental adhesions to the mesh.  After this was completed, I identified the stomach.  This was brought out to the incision and a 0 Vicryl stitch was used to pexy the lateral edge to the fascia.  I then placed a pursestring suture using a 2-0 prolene in the stomach near the greater curve.  I made an incision  in the LUQ where the stomach easily reached.  I entered the abdominal cavity bluntly and then passed the G tube through the defect.  I then made a small gastrotomy using cautery through the middle of the purse string.  The end of the tube was inserted into the stomach and the purse string suture was tied.  I then completed the pexy to the abdominal wall with interrupted 0 Vicryl sutures. Once secured, the balloon was inflated with 36ml of sterile water.  It was tightened loosely to the skin and secured.  I used three 2-0 nylon sutures to secure the tube to the skin.  I closed the fascia with 0 PDS sutures x2 in a running fashion.  The skin was closed with a running 4-0 Vicryl.  A sterile dressing was applied.  The patient was extubated without difficulty.  All counts were correct per OR staff.  The patient was transferred to the PACU in stable condition.

## 2014-10-05 NOTE — Progress Notes (Signed)
NUTRITION FOLLOW UP  Intervention:   -As tolerated:  -Initiate Osmolite 1.2 @ 20 ml/hr via PEG and increase by 10 ml every 8 hours to goal rate of 60 ml/hr.   -Tube feeding regimen provides 1872 kcal (100% of needs), 86 grams of protein, and 1279 ml of H2O.   -If bolus feedings warranted;   -Recommend Osmolite 1.2 at goal rate of 6 can (237 ml each) daily + Pro-Stat 30 ml once daily to provide 1810 kcal (100% est kcal needs), 95 gram protein (100% est protein needs), and 1170 ml free water.   -Recommend 474 ml bolus feeds (2 cans) TID. Flush 60 ml before and after each bolus feed, will require additional 670 ml free water flushes  Nutrition Dx:   Inadequate oral intake related to dysphagia/decreased appetite as evidenced by 25 lb wt loss in 3 months, PO intake < 75%.   Goal:   TF to meet >/= 90% of their estimated nutrition needs    Monitor:   TF order, TF tolerance, total protein/energy intake, labs, weights, swallow profile  Assessment:   1/18: -Pt NPO. Family in room confirmed pt unable to swallow, currently waiting to discuss plan with Oncology regarding palliative surgery vs chemo and radiation. Will continue to monitor POC for nutrition interventions as warranted/tolerated. -MD noted pt with ongoing decreased appetite/dysphagia for past 1-2 months, was consuming liquids or semi soft foods. -Endorsed ongoing weight loss of ~20 lbs. Previous medical records indicate wt loss of 25 lb in past 3 months (13% body weight loss, severe for time frame) -Medical records indicate pt with prior hx of ETOH abuse  -Hypoglycemic episode, fluids modified to D5  1/20: -Pt NPO, was getting PEG tube placed during time of RD follow up. No family present in room -Per discussion with RN, TF will likely be initiated 24 hours post PEG placement -Plan for pt to receive radiation, underwent treaement on 1/19 -Placed TF recommendations to begin as continuous d/t hx of wt loss/poor PO with transition to  bolus feedings as tolerated  Height: Ht Readings from Last 1 Encounters:  10/01/14 5' 9.5" (1.765 m)    Weight Status:   Wt Readings from Last 1 Encounters:  10/01/14 163 lb (73.936 kg)    Re-estimated needs:  Kcal:1800-2000 Protein: 90-100 gram Fluid: >/=2200 ml daily   Skin: WDL  Diet Order: Diet NPO time specified Except for: Other (See Comments)   Intake/Output Summary (Last 24 hours) at 10/05/14 1352 Last data filed at 10/05/14 0854  Gross per 24 hour  Intake   1875 ml  Output    600 ml  Net   1275 ml    Last BM: 1/18   Labs:   Recent Labs Lab 10/03/14 0440 10/04/14 0500 10/05/14 0510  NA 143 142 138  K 3.8 3.5 3.5  CL 104 104 100  CO2 27 30 29   BUN 23 19 13   CREATININE 0.57 0.60 0.59  CALCIUM 9.0 8.9 8.8  MG  --  2.0 2.0  GLUCOSE 68* 116* 121*    CBG (last 3)   Recent Labs  10/04/14 2324 10/05/14 0539 10/05/14 1158  GLUCAP 113* 117* 111*    Scheduled Meds: . [MAR Hold] antiseptic oral rinse  7 mL Mouth Rinse BID  . [MAR Hold] enoxaparin (LOVENOX) injection  40 mg Subcutaneous Q24H  . [MAR Hold] piperacillin-tazobactam (ZOSYN)  IV  3.375 g Intravenous 3 times per day  . [MAR Hold] sodium chloride  3 mL Intravenous Q12H  . [  MAR Hold] thiamine  100 mg Intravenous Daily    Continuous Infusions: . dextrose 5 % and 0.45% NaCl Stopped (10/05/14 1236)  . lactated ringers 1,000 mL (10/05/14 1242)    Atlee Abide MS RD LDN Clinical Dietitian [phone]

## 2014-10-05 NOTE — Consult Note (Signed)
Reason for Consult:  Gastrostomy feeding tube Referring Physician: Bonnielee Haff, MD  Kyle Bauer is an 81 y.o. male.  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 Comes in with mechanical dysphagia due to the mass. Fine-needle aspiration biopsy of the right neck mass was positive for squamous cell carcinoma (case number is [id number]) A CT of the neck 10/01/2014 showed a right hypopharyngeal 2.7 x 2.7 x 3.6 cm mass extending into the right lateral aspect of the upper larynx. This mass extends into the upper esophagus Where it measures 4.1 x 2 cm x 3.6 cm. The mass extends into the Laryngeal cartilage laterally.This mass corresponds to a recent uptake and a PET scan. Also seen, and partially necrotic metastatic lymph node at anterior right sternocleidomastoid new muscle. A Recent CT scan of the chest showed further increase in the size of the previously noted pulmonary nodules highly suspicious for progressive disease. The patient also developed new splenic infarcts.  He is unable to swallow and we are ask to see and surgically place a gastrotomy feeding tube.  IR and GI cannot pass anything thru the esophageus to do procedure.   Past Medical History  Diagnosis Date  . Rheumatic fever age 6  . Allergic rhinitis   . Strabismus   . Cancer     lung ca dx'd 2006    Past Surgical History  Procedure Laterality Date  . Tonsillectomy    . Cholecystectomy    . Right elbow surgery    . Appendectomy    . Total hip arthroplasty      Left  . Colon resection x 2      for perforated colon-colostomy x 6 months  . Lobectomy      RUL-for adenoca T2,N0,MX  . Lul wedge resection      for adenoca with radioactive seeds  . Bilatteral vat's lung resections    . Nasal fracture surgery    . Right arm      Family History  Problem Relation Age of Onset  . Other Father     cardiac arrythmia  . Heart  disease Father     Social History:  reports that he quit smoking about 31 years ago. He does not have any smokeless tobacco history on file. He reports that he drinks alcohol. He reports that he does not use illicit drugs.  Allergies: No Known Allergies  Medications:  Prior to Admission:  Prescriptions prior to admission  Medication Sig Dispense Refill Last Dose  . Aspirin-Acetaminophen (GOODYS BODY PAIN PO) Take 1 packet by mouth every 6 (six) hours as needed (for pain).    10/01/2014 at Unknown time  . fexofenadine (ALLEGRA) 180 MG tablet Take 180 mg by mouth daily.   10/01/2014 at Unknown time  . HYDROcodone-acetaminophen (NORCO) 7.5-325 MG per tablet Take 1 tablet by mouth 3 (three) times daily as needed for moderate pain.   10/01/2014 at Unknown time  . ibuprofen (ADVIL,MOTRIN) 200 MG tablet Take 200 mg by mouth every 6 (six) hours as needed for mild pain.    10/01/2014 at Unknown time  . omeprazole (PRILOSEC) 40 MG capsule Take 40 mg by mouth daily.   10/01/2014 at Unknown time  . UNABLE TO FIND Weekly allergy shots   09/18/2014  . vitamin C (ASCORBIC ACID) 500 MG tablet Take 500 mg by mouth daily.   10/01/2014 at Unknown time   Scheduled: Marland Kitchen [  MAR Hold] antiseptic oral rinse  7 mL Mouth Rinse BID  . [MAR Hold] enoxaparin (LOVENOX) injection  40 mg Subcutaneous Q24H  . [MAR Hold] piperacillin-tazobactam (ZOSYN)  IV  3.375 g Intravenous 3 times per day  . [MAR Hold] sodium chloride  3 mL Intravenous Q12H  . [MAR Hold] thiamine  100 mg Intravenous Daily   Continuous: . dextrose 5 % and 0.45% NaCl Stopped (10/05/14 1236)  . lactated ringers 1,000 mL (10/05/14 1242)   PRN:[MAR Hold] acetaminophen, [MAR Hold] albuterol, [MAR Hold]  morphine injection, [MAR Hold] ondansetron (ZOFRAN) IV Anti-infectives    Start     Dose/Rate Route Frequency Ordered Stop   10/01/14 1730  [MAR Hold]  piperacillin-tazobactam (ZOSYN) IVPB 3.375 g     (MAR Hold since 10/05/14 1213)   3.375 g12.5 mL/hr over 240  Minutes Intravenous 3 times per day 10/01/14 1719        Results for orders placed or performed during the hospital encounter of 10/01/14 (from the past 48 hour(s))  Glucose, capillary     Status: None   Collection Time: 10/03/14  5:44 PM  Result Value Ref Range   Glucose-Capillary 93 70 - 99 mg/dL  Glucose, capillary     Status: None   Collection Time: 10/03/14 11:47 PM  Result Value Ref Range   Glucose-Capillary 86 70 - 99 mg/dL  Basic metabolic panel     Status: Abnormal   Collection Time: 10/04/14  5:00 AM  Result Value Ref Range   Sodium 142 135 - 145 mmol/L    Comment: Please note change in reference range.   Potassium 3.5 3.5 - 5.1 mmol/L    Comment: Please note change in reference range.   Chloride 104 96 - 112 mEq/L   CO2 30 19 - 32 mmol/L   Glucose, Bld 116 (H) 70 - 99 mg/dL   BUN 19 6 - 23 mg/dL   Creatinine, Ser 0.60 0.50 - 1.35 mg/dL   Calcium 8.9 8.4 - 10.5 mg/dL   GFR calc non Af Amer >90 >90 mL/min   GFR calc Af Amer >90 >90 mL/min    Comment: (NOTE) The eGFR has been calculated using the CKD EPI equation. This calculation has not been validated in all clinical situations. eGFR's persistently <90 mL/min signify possible Chronic Kidney Disease.    Anion gap 8 5 - 15  CBC     Status: Abnormal   Collection Time: 10/04/14  5:00 AM  Result Value Ref Range   WBC 12.7 (H) 4.0 - 10.5 K/uL   RBC 3.90 (L) 4.22 - 5.81 MIL/uL   Hemoglobin 12.6 (L) 13.0 - 17.0 g/dL   HCT 38.3 (L) 39.0 - 52.0 %   MCV 98.2 78.0 - 100.0 fL   MCH 32.3 26.0 - 34.0 pg   MCHC 32.9 30.0 - 36.0 g/dL   RDW 13.1 11.5 - 15.5 %   Platelets 451 (H) 150 - 400 K/uL  Magnesium     Status: None   Collection Time: 10/04/14  5:00 AM  Result Value Ref Range   Magnesium 2.0 1.5 - 2.5 mg/dL  Glucose, capillary     Status: Abnormal   Collection Time: 10/04/14  7:05 AM  Result Value Ref Range   Glucose-Capillary 107 (H) 70 - 99 mg/dL  Glucose, capillary     Status: Abnormal   Collection Time:  10/04/14 12:26 PM  Result Value Ref Range   Glucose-Capillary 131 (H) 70 - 99 mg/dL  Glucose, capillary  Status: None   Collection Time: 10/04/14  4:49 PM  Result Value Ref Range   Glucose-Capillary 97 70 - 99 mg/dL  Glucose, capillary     Status: Abnormal   Collection Time: 10/04/14 11:24 PM  Result Value Ref Range   Glucose-Capillary 113 (H) 70 - 99 mg/dL  CBC     Status: Abnormal   Collection Time: 10/05/14  5:10 AM  Result Value Ref Range   WBC 13.7 (H) 4.0 - 10.5 K/uL   RBC 3.95 (L) 4.22 - 5.81 MIL/uL   Hemoglobin 12.8 (L) 13.0 - 17.0 g/dL   HCT 38.3 (L) 39.0 - 52.0 %   MCV 97.0 78.0 - 100.0 fL   MCH 32.4 26.0 - 34.0 pg   MCHC 33.4 30.0 - 36.0 g/dL   RDW 13.1 11.5 - 15.5 %   Platelets 478 (H) 150 - 400 K/uL  Basic metabolic panel     Status: Abnormal   Collection Time: 10/05/14  5:10 AM  Result Value Ref Range   Sodium 138 135 - 145 mmol/L    Comment: Please note change in reference range.   Potassium 3.5 3.5 - 5.1 mmol/L    Comment: Please note change in reference range.   Chloride 100 96 - 112 mEq/L   CO2 29 19 - 32 mmol/L   Glucose, Bld 121 (H) 70 - 99 mg/dL   BUN 13 6 - 23 mg/dL   Creatinine, Ser 0.59 0.50 - 1.35 mg/dL   Calcium 8.8 8.4 - 10.5 mg/dL   GFR calc non Af Amer >90 >90 mL/min   GFR calc Af Amer >90 >90 mL/min    Comment: (NOTE) The eGFR has been calculated using the CKD EPI equation. This calculation has not been validated in all clinical situations. eGFR's persistently <90 mL/min signify possible Chronic Kidney Disease.    Anion gap 9 5 - 15  Magnesium     Status: None   Collection Time: 10/05/14  5:10 AM  Result Value Ref Range   Magnesium 2.0 1.5 - 2.5 mg/dL  Glucose, capillary     Status: Abnormal   Collection Time: 10/05/14  5:39 AM  Result Value Ref Range   Glucose-Capillary 117 (H) 70 - 99 mg/dL  Glucose, capillary     Status: Abnormal   Collection Time: 10/05/14 11:58 AM  Result Value Ref Range   Glucose-Capillary 111 (H) 70 - 99  mg/dL    No results found.  ROS Blood pressure 140/78, pulse 64, temperature 98.4 F (36.9 C), temperature source Oral, resp. rate 19, height 5' 9.5" (1.765 m), weight 163 lb (73.936 kg), SpO2 94 %. Physical Exam  Constitutional: He is oriented to person, place, and time.  Elderly WM no acute distress, only complaint is pain and swelling right side of his neck.  HENT:  Head: Normocephalic and atraumatic.  Nose: Nose normal.  Eyes: Conjunctivae and EOM are normal. Right eye exhibits no discharge. Left eye exhibits no discharge. No scleral icterus.  Neck: Normal range of motion. Neck supple. No JVD present. No tracheal deviation present.  Large 5-6 cm mass right anterior neck/parotid area.  Cardiovascular: Normal rate, regular rhythm, normal heart sounds and intact distal pulses.   Respiratory: Effort normal and breath sounds normal. No respiratory distress. He has no wheezes. He has no rales. He exhibits no tenderness.  GI: Soft. Bowel sounds are normal. He exhibits no distension and no mass. There is no tenderness. There is no rebound and no guarding.  Midline incision  well healed.  Musculoskeletal: He exhibits no edema.  Neurological: He is alert and oriented to person, place, and time. A cranial nerve deficit is present.  Skin: Skin is warm and dry. No rash noted. No erythema. No pallor.  Psychiatric: He has a normal mood and affect. His behavior is normal. Judgment and thought content normal.    Assessment/Plan: 1.  Right hypopharyngeal  neck mass was positive for squamous cell carcinoma   2.7 x 2.7 x 3.6 cm mass  2.  Right and left upper lobe adenocarcinoma 3.  Hx of colon perforation with colon resection and ostomy reversal 4.  Hx of ETOH abuse 5.  PCM/Dehydration  6.  GERD 7.  Hx of Rheumatic fever.  Plan: OR today for open G tube.     Sharell Hilmer C. 6/43/1427, 6:70 PM

## 2014-10-05 NOTE — Progress Notes (Signed)
Subjective: The patient is seen and examined today. Several family members at the bedside. He still have some difficulty swallowing and currently nothing by mouth in preparation for open gastrostomy tube placement by surgery. He denied having any significant nausea or vomiting, no fever or chills.  Objective: Vital signs in last 24 hours: Temp:  [98.1 F (36.7 C)-98.7 F (37.1 C)] 98.7 F (37.1 C) (01/20 1633) Pulse Rate:  [60-82] 72 (01/20 1633) Resp:  [16-20] 18 (01/20 1633) BP: (140-162)/(70-80) 150/70 mmHg (01/20 1633) SpO2:  [93 %-98 %] 96 % (01/20 1633)  Intake/Output from previous day: 01/19 0701 - 01/20 0700 In: 2225 [I.V.:1725; IV Piggyback:500] Out: 600 [Urine:600] Intake/Output this shift: Total I/O In: 1125 [I.V.:1125] Out: 0   General appearance: alert, cooperative, fatigued and no distress Resp: clear to auscultation bilaterally Cardio: regular rate and rhythm, S1, S2 normal, no murmur, click, rub or gallop GI: soft, non-tender; bowel sounds normal; no masses,  no organomegaly Extremities: extremities normal, atraumatic, no cyanosis or edema  Lab Results:   Recent Labs  10/04/14 0500 10/05/14 0510  WBC 12.7* 13.7*  HGB 12.6* 12.8*  HCT 38.3* 38.3*  PLT 451* 478*   BMET  Recent Labs  10/04/14 0500 10/05/14 0510  NA 142 138  K 3.5 3.5  CL 104 100  CO2 30 29  GLUCOSE 116* 121*  BUN 19 13  CREATININE 0.60 0.59  CALCIUM 8.9 8.8    Studies/Results: No results found.  Medications: I have reviewed the patient's current medications.  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 This can be done as a single modality followed by systemic chemotherapy for the stage IV disease versus concurrent chemotherapy with carboplatin and paclitaxel every 3 weeks or weekly cisplatin. I will discuss with Dr. Tammi Klippel. 2)  dysphagia: It is scheduled for open gastrostomy tube placement today. 3) history of early-stage bilateral lung cancer: Will continue on observation for now. I discussed my recommendation with the patient and his family.   LOS: 4 days    Gordie Crumby K. 10/05/2014

## 2014-10-05 NOTE — Anesthesia Preprocedure Evaluation (Addendum)
Anesthesia Evaluation  Patient identified by MRN, date of birth, ID band Patient awake    Reviewed: Allergy & Precautions, H&P , NPO status , Patient's Chart, lab work & pertinent test results  Airway Mallampati: II  TM Distance: >3 FB Neck ROM: Full    Dental no notable dental hx. (+) Edentulous Upper, Partial Lower, Dental Advisory Given   Pulmonary COPDformer smoker,  H/o lung CA breath sounds clear to auscultation  Pulmonary exam normal       Cardiovascular negative cardio ROS  Rhythm:Regular Rate:Normal     Neuro/Psych negative neurological ROS  negative psych ROS   GI/Hepatic negative GI ROS, Neg liver ROS,   Endo/Other  negative endocrine ROS  Renal/GU negative Renal ROS  negative genitourinary   Musculoskeletal   Abdominal   Peds  Hematology negative hematology ROS (+)   Anesthesia Other Findings   Reproductive/Obstetrics negative OB ROS                            Anesthesia Physical Anesthesia Plan  ASA: III  Anesthesia Plan: General   Post-op Pain Management:    Induction: Intravenous  Airway Management Planned: Oral ETT and Video Laryngoscope Planned  Additional Equipment:   Intra-op Plan:   Post-operative Plan: Extubation in OR and Possible Post-op intubation/ventilation  Informed Consent: I have reviewed the patients History and Physical, chart, labs and discussed the procedure including the risks, benefits and alternatives for the proposed anesthesia with the patient or authorized representative who has indicated his/her understanding and acceptance.   Dental advisory given  Plan Discussed with: CRNA  Anesthesia Plan Comments:         Anesthesia Quick Evaluation

## 2014-10-05 NOTE — Anesthesia Postprocedure Evaluation (Signed)
  Anesthesia Post-op Note  Patient: Kyle Bauer  Procedure(s) Performed: Procedure(s) with comments: open gastrostomy tube (N/A) - open G-tube  Patient Location: PACU  Anesthesia Type:General  Level of Consciousness: awake and alert   Airway and Oxygen Therapy: Patient Spontanous Breathing  Post-op Pain: none  Post-op Assessment: Post-op Vital signs reviewed, Patient's Cardiovascular Status Stable and Respiratory Function Stable  Post-op Vital Signs: Reviewed  Filed Vitals:   10/05/14 1615  BP: 158/73  Pulse: 73  Temp: 37 C  Resp: 18    Complications: No apparent anesthesia complications

## 2014-10-05 NOTE — Progress Notes (Signed)
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 Comes in with mechanical dysphagia due to the mass. He was admitted for further management. Seen by oncology and interventional radiology Peg placed 1/20  Past medical history:  Past Medical History  Diagnosis Date  . Rheumatic fever age 10  . Allergic rhinitis   . Strabismus   . Cancer     lung ca dx'd 2006    Consultants:   Discussed with Dr. Constance Holster with ENT. Oncology is following.   General surgery  Procedures:   PEG tube  Antibiotics: Zosyn 1/16  Subjective:   Objective: Vital Signs  Filed Vitals:   10/05/14 1553 10/05/14 1600 10/05/14 1603 10/05/14 1615  BP:  162/75 162/75 158/73  Pulse: 73 73 74 73  Temp:    98.6 F (37 C)  TempSrc:      Resp: 19 17 19 18   Height:      Weight:      SpO2: 98% 96% 95% 96%    Intake/Output Summary (Last 24 hours) at 10/05/14 1628 Last data filed at 10/05/14 1539  Gross per 24 hour  Intake   2175 ml  Output    600 ml  Net   1575 ml   Filed Weights   10/01/14 1656  Weight: 73.936 kg (163 lb)    General appearance: alert, cooperative, appears stated age and no distress Resp: Coarse breath sounds bilaterally. No wheezing Rales or rhonchi. Cardio: regular rate and rhythm, S1, S2 normal, no murmur, click, rub or gallop GI: soft, non-tender; bowel sounds normal; no masses,  no organomegaly Extremities: extremities normal, atraumatic, no cyanosis or edema   Lab Results:  Basic Metabolic Panel:  Recent Labs Lab 10/01/14 1231 10/02/14 0713 10/03/14 0440 10/04/14 0500 10/05/14 0510  NA 137 140 143 142 138  K 3.8 4.0 3.8 3.5 3.5  CL 100 100 104 104 100  CO2 29 26 27 30 29   GLUCOSE 140* 80 68* 116* 121*  BUN 25* 22 23 19 13     CREATININE 0.68 0.66 0.57 0.60 0.59  CALCIUM 9.4 9.0 9.0 8.9 8.8  MG  --   --   --  2.0 2.0   Liver Function Tests:  Recent Labs Lab 09/29/14 0937 10/01/14 1231 10/02/14 0713  AST 19 27 33  ALT 12 21 22   ALKPHOS 127 145* 123*  BILITOT 0.90 1.1 0.8  PROT 7.1 7.1 6.3  ALBUMIN 3.0* 3.3* 2.9*   CBC:  Recent Labs Lab 09/29/14 0936 10/01/14 1231 10/02/14 0539 10/03/14 0440 10/04/14 0500 10/05/14 0510  WBC 19.1* 20.0* 17.7* 13.2* 12.7* 13.7*  NEUTROABS 15.7* 16.9*  --   --   --   --   HGB 15.3 14.9 13.1 12.2* 12.6* 12.8*  HCT 44.2 43.9 38.8* 38.7* 38.3* 38.3*  MCV 94.0 96.5 96.8 99.5 98.2 97.0  PLT 433* 471* 402* 434* 451* 478*     Recent Results (from the past 240 hour(s))  Culture, blood (routine x 2)     Status: None (Preliminary result)   Collection Time: 10/01/14  6:26 PM  Result Value Ref Range Status   Specimen Description BLOOD RIGHT HAND  Final   Special Requests BOTTLES DRAWN AEROBIC AND ANAEROBIC 10 CC EACH  Final   Culture   Final  BLOOD CULTURE RECEIVED NO GROWTH TO DATE CULTURE WILL BE HELD FOR 5 DAYS BEFORE ISSUING A FINAL NEGATIVE REPORT Performed at [facility]    Report Status PENDING  Incomplete  Culture, blood (routine x 2)     Status: None (Preliminary result)   Collection Time: 10/01/14  6:26 PM  Result Value Ref Range Status   Specimen Description BLOOD RIGHT ARM  Final   Special Requests BOTTLES DRAWN AEROBIC ONLY 10 CC  Final   Culture   Final           BLOOD CULTURE RECEIVED NO GROWTH TO DATE CULTURE WILL BE HELD FOR 5 DAYS BEFORE ISSUING A FINAL NEGATIVE REPORT Note: Culture results may be compromised due to an excessive volume of blood received in culture bottles. Performed at [facility]    Report Status PENDING  Incomplete  Surgical pcr screen     Status: None   Collection Time: 10/05/14 11:12 AM  Result Value Ref Range Status   MRSA, PCR NEGATIVE NEGATIVE Final   Staphylococcus aureus NEGATIVE NEGATIVE  Final    Comment:        The Xpert SA Assay (FDA approved for NASAL specimens in patients over 20 years of age), is one component of a comprehensive surveillance program.  Test performance has been validated by [facility] for patients greater than or equal to 15 year old. It is not intended to diagnose infection nor to guide or monitor treatment.       Studies/Results: No results found.  Medications:  Scheduled: . antiseptic oral rinse  7 mL Mouth Rinse BID  . enoxaparin (LOVENOX) injection  40 mg Subcutaneous Q24H  . piperacillin-tazobactam (ZOSYN)  IV  3.375 g Intravenous 3 times per day  . sodium chloride  3 mL Intravenous Q12H  . thiamine  100 mg Intravenous Daily   Continuous: . dextrose 5 % and 0.45% NaCl Stopped (10/05/14 1236)   ZOX:[id number], albuterol, morphine injection, ondansetron (ZOFRAN) IV  Assessment/Plan:  Principal Problem:   Squamous cell cancer of hypopharynx Active Problems:   Multiple lung nodules   Head and neck cancer   Mechanical dysphagia   Alcohol abuse   Leukocytosis   Dehydration   Weight loss   Protein-calorie malnutrition, severe    Mechanical dysphagia This is secondary to his head and neck cancer in the hypopharynx. CT scan of his neck is as above. There appears to be an obstruction of the esophageal opening.Dr. [place] Pink d/w with Dr. Constance Holster 1/18 .  Oncology is following- Radiation treatment.  Since he will be unable to take orally for the foreseeable future a PEG tube will need to be placed for nutritional purposes.  PEG placed 1/20 by Gen surg  Fever No further episodes of fever. Blood cultures are negative so far.  He was started on Zosyn due to possibility of infection around this tumor vs Aspiration  WBC is improved.  Squamous cell cancer of head and neck Radiation oncologist to see. Per my discussions with Dr. Constance Holster yesterday there is no role for palliative surgery. Oncology input appreciated  NSVT Patient  remains asymptomatic. He does not have any known history of heart disease.  Stable currently  Dehydration and weight loss and hypoglycemia Secondary to dysphagia and poor oral intake. Continue with IV fluids. NPO.  Glucose level is improved after IV fluids were changed to D5.  Leukocytosis This has been present for the last month and a half. Counts are improved with Zosyn.\ We have no  source other than microaspiration Will  to PO clinda in 24 hours and observe  Alcohol abuse He drinks moderate to significant amounts of beer and wine. No signs of withdrawal. Continue telemetry and CIWA protocol. Thiamine will be given intravenously.  Previous history of lung cancer with new pulmonary nodules Lung cancer was diagnosed originally in 2006/7. He underwent surgery (?pneumonectomy) on both his lungs and had radiation treatment. He's never had chemotherapy. Management per oncology. The pulmonary nodules detected recently could be metastatic process from his head and neck cancer.  DVT Prophylaxis: Lovenox    Code Status: Full code for now  Family Communication: Discussed with the patient and his daughters  Disposition Plan: Not ready for discharge.

## 2014-10-06 ENCOUNTER — Inpatient Hospital Stay (HOSPITAL_COMMUNITY): Payer: Medicare Other

## 2014-10-06 ENCOUNTER — Encounter (HOSPITAL_COMMUNITY): Payer: Self-pay | Admitting: General Surgery

## 2014-10-06 ENCOUNTER — Ambulatory Visit: Payer: Medicare Other | Admitting: Internal Medicine

## 2014-10-06 LAB — GLUCOSE, CAPILLARY
GLUCOSE-CAPILLARY: 125 mg/dL — AB (ref 70–99)
GLUCOSE-CAPILLARY: 117 mg/dL — AB (ref 70–99)
Glucose-Capillary: 129 mg/dL — ABNORMAL HIGH (ref 70–99)

## 2014-10-06 MED ORDER — CLINDAMYCIN PALMITATE HCL 75 MG/5ML PO SOLR
300.0000 mg | Freq: Three times a day (TID) | ORAL | Status: DC
  Administered 2014-10-06 – 2014-10-10 (×13): 300 mg
  Filled 2014-10-06 (×18): qty 20

## 2014-10-06 MED ORDER — OSMOLITE 1.2 CAL PO LIQD
1000.0000 mL | ORAL | Status: DC
  Administered 2014-10-06 – 2014-10-08 (×3): 1000 mL
  Filled 2014-10-06 (×4): qty 1000

## 2014-10-06 MED ORDER — JEVITY 1.2 CAL PO LIQD: 1000.0000 mL | ORAL | Status: DC

## 2014-10-06 MED ORDER — MORPHINE SULFATE 2 MG/ML IJ SOLN
2.0000 mg | INTRAMUSCULAR | Status: DC | PRN
  Administered 2014-10-06 – 2014-10-11 (×27): 2 mg via INTRAVENOUS
  Filled 2014-10-06 (×28): qty 1

## 2014-10-06 NOTE — Progress Notes (Signed)
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 Comes in with mechanical dysphagia due to the mass. He was admitted for further management. Seen by oncology and interventional radiology Peg placed 1/20  Past medical history:  Past Medical History  Diagnosis Date  . Rheumatic fever age 85  . Allergic rhinitis   . Strabismus   . Cancer     lung ca dx'd 2006    Consultants:   Discussed with Dr. Constance Holster with ENT. Oncology is following.   General surgery  Procedures:   PEG tube  Antibiotics: Zosyn 1/16  Subjective:  Well No concerns voiced today tolerating tube feeds fairly-minimum residuals   Objective: Vital Signs  Filed Vitals:   10/05/14 2359 10/06/14 0558 10/06/14 1011 10/06/14 1500  BP: 137/80 130/55  126/66  Pulse: 76 68  68  Temp:  98.4 F (36.9 C)  98 F (36.7 C)  TempSrc:  Oral  Oral  Resp: 18 16  18   Height:      Weight:   78.835 kg (173 lb 12.8 oz)   SpO2: 92% 93%  93%    Intake/Output Summary (Last 24 hours) at 10/06/14 1648 Last data filed at 10/06/14 1300  Gross per 24 hour  Intake      0 ml  Output    850 ml  Net   -850 ml   Filed Weights   10/01/14 1656 10/06/14 1011  Weight: 73.936 kg (163 lb) 78.835 kg (173 lb 12.8 oz)    General appearance: alert, cooperative, appears stated age and no distress Resp: Coarse breath sounds bilaterally. No wheezing Rales or rhonchi. Cardio: regular rate and rhythm, S1, S2 normal, no murmur, click, rub or gallop GI: soft, non-tender; bowel sounds normal; no masses,  no organomegaly Extremities: extremities normal, atraumatic, no cyanosis or edema   Lab Results:  Basic Metabolic Panel:  Recent Labs Lab 10/01/14 1231 10/02/14 0713 10/03/14 0440 10/04/14 0500  10/05/14 0510  NA 137 140 143 142 138  K 3.8 4.0 3.8 3.5 3.5  CL 100 100 104 104 100  CO2 29 26 27 30 29   GLUCOSE 140* 80 68* 116* 121*  BUN 25* 22 23 19 13   CREATININE 0.68 0.66 0.57 0.60 0.59  CALCIUM 9.4 9.0 9.0 8.9 8.8  MG  --   --   --  2.0 2.0   Liver Function Tests:  Recent Labs Lab 10/01/14 1231 10/02/14 0713  AST 27 33  ALT 21 22  ALKPHOS 145* 123*  BILITOT 1.1 0.8  PROT 7.1 6.3  ALBUMIN 3.3* 2.9*   CBC:  Recent Labs Lab 10/01/14 1231 10/02/14 0539 10/03/14 0440 10/04/14 0500 10/05/14 0510  WBC 20.0* 17.7* 13.2* 12.7* 13.7*  NEUTROABS 16.9*  --   --   --   --   HGB 14.9 13.1 12.2* 12.6* 12.8*  HCT 43.9 38.8* 38.7* 38.3* 38.3*  MCV 96.5 96.8 99.5 98.2 97.0  PLT 471* 402* 434* 451* 478*     Recent Results (from the past 240 hour(s))  Culture, blood (routine x 2)     Status: None (Preliminary result)   Collection Time: 10/01/14  6:26 PM  Result Value Ref Range Status   Specimen Description BLOOD RIGHT HAND  Final   Special Requests BOTTLES DRAWN AEROBIC AND ANAEROBIC  10 CC EACH  Final   Culture   Final           BLOOD CULTURE RECEIVED NO GROWTH TO DATE CULTURE WILL BE HELD FOR 5 DAYS BEFORE ISSUING A FINAL NEGATIVE REPORT Performed at [facility]    Report Status PENDING  Incomplete  Culture, blood (routine x 2)     Status: None (Preliminary result)   Collection Time: 10/01/14  6:26 PM  Result Value Ref Range Status   Specimen Description BLOOD RIGHT ARM  Final   Special Requests BOTTLES DRAWN AEROBIC ONLY 10 CC  Final   Culture   Final           BLOOD CULTURE RECEIVED NO GROWTH TO DATE CULTURE WILL BE HELD FOR 5 DAYS BEFORE ISSUING A FINAL NEGATIVE REPORT Note: Culture results may be compromised due to an excessive volume of blood received in culture bottles. Performed at [facility]    Report Status PENDING  Incomplete  Surgical pcr screen     Status: None   Collection Time: 10/05/14 11:12 AM  Result Value Ref Range Status     MRSA, PCR NEGATIVE NEGATIVE Final   Staphylococcus aureus NEGATIVE NEGATIVE Final    Comment:        The Xpert SA Assay (FDA approved for NASAL specimens in patients over 25 years of age), is one component of a comprehensive surveillance program.  Test performance has been validated by [facility] for patients greater than or equal to 86 year old. It is not intended to diagnose infection nor to guide or monitor treatment.       Studies/Results: Dg Chest 2 View  10/06/2014   CLINICAL DATA:  Followup pneumonia  EXAM: CHEST  2 VIEW  COMPARISON:  10/01/2014  FINDINGS: Cardiomediastinal silhouette is stable. Persistent small right pleural effusion. There is right base posteriorly atelectasis or infiltrate. Stable postsurgical changes left upper lobe. Again noted left lower lobe nodules.  IMPRESSION: Persistent small right pleural effusion. Right base posterior atelectasis or infiltrate. No pulmonary edema. Again noted left lower lobe nodules.   Electronically Signed   By: Lahoma Crocker M.D.   On: 10/06/2014 08:50    Medications:  Scheduled: . antiseptic oral rinse  7 mL Mouth Rinse BID  . clindamycin  300 mg Per Tube 3 times per day  . enoxaparin (LOVENOX) injection  40 mg Subcutaneous Q24H  . sodium chloride  3 mL Intravenous Q12H  . thiamine  100 mg Intravenous Daily   Continuous: . feeding supplement (OSMOLITE 1.2 CAL) 1,000 mL (10/06/14 1040)   QHU:[id number], albuterol, morphine injection, ondansetron (ZOFRAN) IV, oxyCODONE  Assessment/Plan:  Principal Problem:   Squamous cell cancer of hypopharynx Active Problems:   Multiple lung nodules   Head and neck cancer   Mechanical dysphagia   Alcohol abuse   Leukocytosis   Dehydration   Weight loss   Protein-calorie malnutrition, severe    Mechanical dysphagia This is secondary to his head and neck cancer in the hypopharynx. CT scan of his neck is as above. There appears to be an obstruction of the esophageal  opening.Dr. [place] Pink d/w with Dr. Constance Holster 1/18 .  Oncology is following- PEG placed 1/20 by Gen surg  Fever No further episodes of fever. Blood cultures are negative so far.  He was started on Zosyn due to possibility of infection around this tumor vs Aspiration  WBC is improved. I have transitioned him on 1/21 to clindamycin via PEG tube to complete a  course of treatment for potential aspiration on 10/10/14 which would be 10 days total treatment  Squamous cell cancer of head and neck Radiation oncologist to see. Per my discussions with Dr. Constance Holster yesterday there is no role for palliative surgery. Oncology input appreciated Radiation-oncology treatment-as per Dr. Mercy Moore, "quad shot"-will receive radiation treatment in the morning and afternoon of Monday, 10/10/2014 and Tuesday, 10/11/2014 followed by a four-week break with possible repeat treatments for a total of 3 cycles.   NSVT Patient remains asymptomatic. He does not have any known history of heart disease.  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 Continue with IV fluids. NPO.  Glucose level is improved after IV fluids were changed to D5. Patient has started tube feeds which are being advanced slowly as per nutritionist to prevent refeeding syndrome Follow phosphorus, magnesium, complete metabolic panel in a.m.  Leukocytosis This has been present for the last month and a half. Counts are improved with Zosyn. We have no source other than microaspiration Will  to PO clinda in 24 hours-see above discussion  Alcohol abuse He drinks moderate to significant amounts of beer and wine. No signs of withdrawal. Continue telemetry and CIWA protocol. Thiamine will be given intravenously.  Previous history of lung cancer with new pulmonary nodules Lung cancer was diagnosed originally in 2006/7. He underwent  surgery (?pneumonectomy) on both his lungs and had radiation treatment. He's never had chemotherapy. Management per oncology. The pulmonary nodules detected recently could be metastatic process from his head and neck cancer.  DVT Prophylaxis: Lovenox    Code Status: Full code for now  Family Communication: No family present  Disposition Plan: inpatient pending resolution and increase in tube feeds-potentially on 10/08/14 may be able to discharge

## 2014-10-06 NOTE — Progress Notes (Signed)
Radiation Oncology         [phone] ________________________________  Name: Kyle Bauer MRN: [id number]  Date: 10/01/2014  DOB: 07-20-1931  Chart Note:  This patient underwent radiation planning 2 days ago. At that time, we created a thermoplastic mask for positioning and obtained CT imaging to design radiation therapy. In developing the radiation plan, I have elected to employ an accelerated course of radiation which has been shown to be well-tolerated in patients with advanced disease and poor performance status, known as a quad shot. The patient will receive radiation treatment in the morning and afternoon of Monday, 10/10/2014 and Tuesday, 10/11/2014 followed by a four-week break with possible repeat treatments for a total of 3 cycles.  This treatment regimen has been successfully employed for palliative radiotherapy of head and neck cancers for over 30 years, and a recent report of its usage at [facility] is abstracted below. I talked to the patient and his family about this treatment regimen, and they are in agreement that this would work well for him in terms of the logistics of radiation treatment.  It would be ideal, for the patient to remain hospitalized through completion of radiation on the evening of January 26, if this is clinically appropriate during his recovery from feeding tube placement and treatment of possible paraesophageal abscess.  ________________________________  Sheral Apley Tammi Klippel, M.D.          1. Oral Oncol. 2015 Oct;51(10):957-62. doi: [email or web address]/[email or web address]. Epub 2015 Aug 14.  Palliative head and neck radiotherapy with the RTOG 8502 regimen for incurable primary or metastatic cancers.  Lok BH(1), Lewis Shock), Gutiontov S(3), Lanning RM(1), Leslee Home S(1), Sonda Primes), Stephens Shire CJ(1), Lauro Regulus SM(1), Riaz N(1), Angelique Holm).  Chief Strategy Officer information:  (1)Department of Radiation Oncology, [place],  [place], [place] [address], [place]. (2)Department of Radiation Oncology, [facility], [place], [place] [address], [place]; [employer], [address], [place] [address], [place]. (3)[employer] [employer], [place], [place] [address], [place]. (4)Department of Medical Oncology, Head and Neck Oncology Service, [facility], [place]. (5)Department of Radiation Oncology, [facility], [place], [place] [address], [place]. Electronic address: [email or web address].  OBJECTIVES: To report on our institutional experience of palliative radiotherapy  (RT) of cancers in the head and neck by the RTOG 8502 'QUAD SHOT' regimen.  METHODS: Seventy-five patients completed at least 1 cycle of palliative RT to the head and neck for primary or metastatic disease based on the RTOG 8502 regimen (3.7 Gy twice daily over 2 consecutive days at 4 week intervals per cycle) between 10/2003 and 03/2013.  RESULTS: Median patient age was 46 years (range 23-97). The most common histologies were squamous cell carcinoma (55%), non-anaplastic thyroid carcinoma  (10%) and salivary gland carcinoma (9%). Thirty patients (40%) received prior RT  at the palliative site. Twenty-eight patients (37%) completed at least three RTOG 8502 cycles. Sixty-five percent of all patients had a palliative response. Median overall survival was 5.67 months (range, 0.20-34.5). Grade 3 toxicity in 4 patients (5%) consisted of acute dermatitis and functional mucositis. Palliative  response was significantly correlated with increasing number of RTOG 8502 cycles  (p = 0.012), but not KPS, prior RT, palliative chemotherapy, prior surgery, histology or stage. On survival analysis, palliative response (p < 0.001), KPS ?  70 (p = 0.001), and greater number of RTOG 8502 cycles (p = 0.022)  remained independent predictors of  improved survival.  CONCLUSIONS: For patients with incurable malignant disease in the head and neck,  the palliative RTOG 8502 'QUAD SHOT' regimen provides excellent rates of palliative response with minimal associated toxicity. Patients who are able to complete greater number of RT cycles have higher rates of palliative response and overall survival.  Copyright  2015 [place]. All rights reserved.  PMID: 44315400  [PubMed - in process]

## 2014-10-06 NOTE — Progress Notes (Signed)
1 Day Post-Op  Subjective: He isn't having much discomfort from surgery.  Incision and G tube look fine.  I have cleaned and changed the dressing.    Objective: Vital signs in last 24 hours: Temp:  [98.4 F (36.9 C)-98.7 F (37.1 C)] 98.4 F (36.9 C) (01/21 0558) Pulse Rate:  [64-82] 68 (01/21 0558) Resp:  [16-20] 16 (01/21 0558) BP: (130-162)/(55-80) 130/55 mmHg (01/21 0558) SpO2:  [91 %-98 %] 93 % (01/21 0558) Last BM Date: 10/03/14 NPO,  Afebrile, VSS No labs Intake/Output from previous day: 01/20 0701 - 01/21 0700 In: 1125 [I.V.:1125] Out: 650 [Urine:600; Drains:50] Intake/Output this shift:    General appearance: alert, cooperative and no distress GI: soft, sore, but site looks fine. Gastric fluid in straight drain.  Lab Results:   Recent Labs  10/04/14 0500 10/05/14 0510  WBC 12.7* 13.7*  HGB 12.6* 12.8*  HCT 38.3* 38.3*  PLT 451* 478*    BMET  Recent Labs  10/04/14 0500 10/05/14 0510  NA 142 138  K 3.5 3.5  CL 104 100  CO2 30 29  GLUCOSE 116* 121*  BUN 19 13  CREATININE 0.60 0.59  CALCIUM 8.9 8.8   PT/INR No results for input(s): LABPROT, INR in the last 72 hours.   Recent Labs Lab 09/29/14 0937 10/01/14 1231 10/02/14 0713  AST 19 27 33  ALT 12 21 22   ALKPHOS 127 145* 123*  BILITOT 0.90 1.1 0.8  PROT 7.1 7.1 6.3  ALBUMIN 3.0* 3.3* 2.9*     Lipase  No results found for: LIPASE   Studies/Results: No results found.  Medications: . antiseptic oral rinse  7 mL Mouth Rinse BID  . enoxaparin (LOVENOX) injection  40 mg Subcutaneous Q24H  . piperacillin-tazobactam (ZOSYN)  IV  3.375 g Intravenous 3 times per day  . sodium chloride  3 mL Intravenous Q12H  . thiamine  100 mg Intravenous Daily    Assessment/Plan 1. 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 Right and left upper lobe adenocarcinoma 3. Hx of colon perforation with colon resection and ostomy reversal 4. Hx  of ETOH abuse 5. PCM/Dehydration - START TF TODAY 6. GERD 7. Hx of Rheumatic fever.    Plan:  We can start some TF today and then get him up to need volume tomorrow.  Clean sites with soap and water, betadine and DSD.   LOS: 5 days    Rheanna Sergent 10/06/2014

## 2014-10-06 NOTE — Progress Notes (Signed)
NUTRITION FOLLOW UP  Intervention:   -Per surgery: Initiate Osmolite 1.2 @ 10 ml/hr via PEG and increase by 10 ml every 4  hours to 20 ml/hr. Run for 20 ml/hr overnight. Advance by 10 ml every 8 hours to goal rate of 60 ml/hr on 1/22 if tolerated overnight feedings. -Tube feeding regimen provides 1872 kcal (100% of needs), 86 grams of protein, and 1279 ml of H2O.   -Transition to bolus feedings as tolerated:  -Recommend Osmolite 1.2 at goal rate of 6 can (237 ml each) daily + Pro-Stat 30 ml once daily to provide 1810 kcal (100% est kcal needs), 95 gram protein (100% est protein needs), and 1170 ml free water.   -Recommend 474 ml bolus feeds (2 cans) TID. Flush 60 ml before and after each bolus feed, will require additional 670 ml free water flushes (~240 ml TID)  Nutrition Dx:   Inadequate oral intake related to dysphagia/decreased appetite as evidenced by 25 lb wt loss in 3 months, PO intake < 75%.   Goal:   TF to meet >/= 90% of their estimated nutrition needs    Monitor:   TF order, TF tolerance, total protein/energy intake, labs, weights, swallow profile  Assessment:   1/18: -Pt NPO. Family in room confirmed pt unable to swallow, currently waiting to discuss plan with Oncology regarding palliative surgery vs chemo and radiation. Will continue to monitor POC for nutrition interventions as warranted/tolerated. -MD noted pt with ongoing decreased appetite/dysphagia for past 1-2 months, was consuming liquids or semi soft foods. -Endorsed ongoing weight loss of ~20 lbs. Previous medical records indicate wt loss of 25 lb in past 3 months (13% body weight loss, severe for time frame) -Medical records indicate pt with prior hx of ETOH abuse  -Hypoglycemic episode, fluids modified to D5  1/20: -Pt NPO, was getting PEG tube placed during time of RD follow up. No family present in room -Per discussion with RN, TF will likely be initiated 24 hours post PEG placement -Plan for pt to receive  radiation, underwent treaement on 1/19 -Placed TF recommendations to begin as continuous d/t hx of wt loss/poor PO with transition to bolus feedings as tolerated  1/21: -Received consult to initiate TF at conservative rate of 10 ml/hr to 20 ml/hr, will infuse 20 ml/hr overnight and assess tolerance for advancement on 1/22 morning -Discussed tube feeding with pt's family. Explained current advancement rate with goal of transitioning over to bolus feedings for overall quality of life. Pt and family verbalized understanding. RN present in room, and confirmed current TF advancement orders -Pt remains NPO. Will modify tube feeding needs pendings pt's ability to tolerate PO intake. Discussed pt with Case manager, recommend pt receive home health services to assist with home TF needs  Height: Ht Readings from Last 1 Encounters:  10/01/14 5' 9.5" (1.765 m)    Weight Status:   Wt Readings from Last 1 Encounters:  10/06/14 173 lb 12.8 oz (78.835 kg)    Re-estimated needs:  Kcal:1800-2000 Protein: 90-100 gram Fluid: >/=2200 ml daily   Skin: WDL  Diet Order: Diet NPO time specified Except for: Other (See Comments)   Intake/Output Summary (Last 24 hours) at 10/06/14 1109 Last data filed at 10/06/14 0900  Gross per 24 hour  Intake   1125 ml  Output    650 ml  Net    475 ml    Last BM: 1/18   Labs:   Recent Labs Lab 10/03/14 0440 10/04/14 0500 10/05/14 0510  NA 143 142 138  K 3.8 3.5 3.5  CL 104 104 100  CO2 27 30 29   BUN 23 19 13   CREATININE 0.57 0.60 0.59  CALCIUM 9.0 8.9 8.8  MG  --  2.0 2.0  GLUCOSE 68* 116* 121*    CBG (last 3)   Recent Labs  10/05/14 1158 10/05/14 1548 10/05/14 1710  GLUCAP 111* 106* 109*    Scheduled Meds: . antiseptic oral rinse  7 mL Mouth Rinse BID  . enoxaparin (LOVENOX) injection  40 mg Subcutaneous Q24H  . piperacillin-tazobactam (ZOSYN)  IV  3.375 g Intravenous 3 times per day  . sodium chloride  3 mL Intravenous Q12H  .  thiamine  100 mg Intravenous Daily    Continuous Infusions: . dextrose 5 % and 0.45% NaCl 75 mL/hr at 10/06/14 0530  . feeding supplement (OSMOLITE 1.2 CAL) 1,000 mL (10/06/14 1040)    [place] Clinical Dietitian [phone]

## 2014-10-07 LAB — CBC WITH DIFFERENTIAL/PLATELET
BASOS ABS: 0 10*3/uL (ref 0.0–0.1)
BASOS PCT: 0 % (ref 0–1)
Eosinophils Absolute: 0.1 10*3/uL (ref 0.0–0.7)
Eosinophils Relative: 0 % (ref 0–5)
HCT: 35 % — ABNORMAL LOW (ref 39.0–52.0)
Hemoglobin: 11.7 g/dL — ABNORMAL LOW (ref 13.0–17.0)
LYMPHS PCT: 7 % — AB (ref 12–46)
Lymphs Abs: 1.2 10*3/uL (ref 0.7–4.0)
MCH: 31.9 pg (ref 26.0–34.0)
MCHC: 33.4 g/dL (ref 30.0–36.0)
MCV: 95.4 fL (ref 78.0–100.0)
MONOS PCT: 10 % (ref 3–12)
Monocytes Absolute: 1.7 10*3/uL — ABNORMAL HIGH (ref 0.1–1.0)
Neutro Abs: 15 10*3/uL — ABNORMAL HIGH (ref 1.7–7.7)
Neutrophils Relative %: 83 % — ABNORMAL HIGH (ref 43–77)
Platelets: 397 10*3/uL (ref 150–400)
RBC: 3.67 MIL/uL — AB (ref 4.22–5.81)
RDW: 12.9 % (ref 11.5–15.5)
WBC: 17.9 10*3/uL — AB (ref 4.0–10.5)

## 2014-10-07 LAB — COMPREHENSIVE METABOLIC PANEL
ALT: 18 U/L (ref 0–53)
AST: 19 U/L (ref 0–37)
Albumin: 2.5 g/dL — ABNORMAL LOW (ref 3.5–5.2)
Alkaline Phosphatase: 77 U/L (ref 39–117)
Anion gap: 6 (ref 5–15)
BILIRUBIN TOTAL: 0.5 mg/dL (ref 0.3–1.2)
BUN: 18 mg/dL (ref 6–23)
CHLORIDE: 100 meq/L (ref 96–112)
CO2: 32 mmol/L (ref 19–32)
CREATININE: 0.6 mg/dL (ref 0.50–1.35)
Calcium: 8.5 mg/dL (ref 8.4–10.5)
GFR calc Af Amer: >90 mL/min (ref >90)
GFR calc non Af Amer: >90 mL/min (ref >90)
Glucose, Bld: 105 mg/dL — ABNORMAL HIGH (ref 70–99)
POTASSIUM: 3.5 mmol/L (ref 3.5–5.1)
Sodium: 138 mmol/L (ref 135–145)
TOTAL PROTEIN: 5.7 g/dL — AB (ref 6.0–8.3)

## 2014-10-07 LAB — GLUCOSE, CAPILLARY
GLUCOSE-CAPILLARY: 104 mg/dL — AB (ref 70–99)
GLUCOSE-CAPILLARY: 124 mg/dL — AB (ref 70–99)
Glucose-Capillary: 103 mg/dL — ABNORMAL HIGH (ref 70–99)
Glucose-Capillary: 106 mg/dL — ABNORMAL HIGH (ref 70–99)
Glucose-Capillary: 126 mg/dL — ABNORMAL HIGH (ref 70–99)
Glucose-Capillary: 133 mg/dL — ABNORMAL HIGH (ref 70–99)

## 2014-10-07 LAB — CULTURE, BLOOD (ROUTINE X 2)
Culture: NO GROWTH
Culture: NO GROWTH

## 2014-10-07 LAB — MAGNESIUM: MAGNESIUM: 2 mg/dL (ref 1.5–2.5)

## 2014-10-07 LAB — PHOSPHORUS: Phosphorus: 2.4 mg/dL (ref 2.3–4.6)

## 2014-10-07 NOTE — Progress Notes (Signed)
2 Days Post-Op  Subjective: He isn't having much discomfort from surgery.  Denies nausea.  Residuals are around 100.  Having jaw pain  Objective: Vital signs in last 24 hours: Temp:  [98 F (36.7 C)-98.6 F (37 C)] 98.1 F (36.7 C) (01/22 0003) Pulse Rate:  [68] 68 (01/22 0003) Resp:  [18] 18 (01/21 2050) BP: (126-132)/(66-71) 132/71 mmHg (01/22 0003) SpO2:  [92 %-94 %] 92 % (01/22 0003) Weight:  [173 lb 12.8 oz (78.835 kg)] 173 lb 12.8 oz (78.835 kg) (01/21 1011) Last BM Date: 10/03/14  Intake/Output from previous day: 01/21 0701 - 01/22 0700 In: 218.3 [NG/GT:218.3] Out: 550 [Urine:550] Intake/Output this shift:    General appearance: alert, cooperative and no distress GI: soft, incision site looks fine. Tube functioning well.   Lab Results:   Recent Labs  10/05/14 0510 10/07/14 0427  WBC 13.7* 17.9*  HGB 12.8* 11.7*  HCT 38.3* 35.0*  PLT 478* 397    BMET  Recent Labs  10/05/14 0510 10/07/14 0427  NA 138 138  K 3.5 3.5  CL 100 100  CO2 29 32  GLUCOSE 121* 105*  BUN 13 18  CREATININE 0.59 0.60  CALCIUM 8.8 8.5   PT/INR No results for input(s): LABPROT, INR in the last 72 hours.   Recent Labs Lab 10/01/14 1231 10/02/14 0713 10/07/14 0427  AST 27 33 19  ALT 21 22 18   ALKPHOS 145* 123* 77  BILITOT 1.1 0.8 0.5  PROT 7.1 6.3 5.7*  ALBUMIN 3.3* 2.9* 2.5*     Lipase  No results found for: LIPASE   Studies/Results: Dg Chest 2 View  10/06/2014   CLINICAL DATA:  Followup pneumonia  EXAM: CHEST  2 VIEW  COMPARISON:  10/01/2014  FINDINGS: Cardiomediastinal silhouette is stable. Persistent small right pleural effusion. There is right base posteriorly atelectasis or infiltrate. Stable postsurgical changes left upper lobe. Again noted left lower lobe nodules.  IMPRESSION: Persistent small right pleural effusion. Right base posterior atelectasis or infiltrate. No pulmonary edema. Again noted left lower lobe nodules.   Electronically Signed   By: Lahoma Crocker  M.D.   On: 10/06/2014 08:50    Medications: . antiseptic oral rinse  7 mL Mouth Rinse BID  . clindamycin  300 mg Per Tube 3 times per day  . enoxaparin (LOVENOX) injection  40 mg Subcutaneous Q24H  . sodium chloride  3 mL Intravenous Q12H  . thiamine  100 mg Intravenous Daily    Assessment/Plan 1. 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 Right and left upper lobe adenocarcinoma 3. Hx of colon perforation with colon resection and ostomy reversal 4. Hx of ETOH abuse 5. PCM/Dehydration - START TF TODAY 6. GERD 7. Hx of Rheumatic fever.    Plan:  Ok to advance to goal on tube feeds per Nutrition recs.  Transition to bolus feeds after that.    LOS: 6 days    Minas Bonser C. 3/00/5110

## 2014-10-07 NOTE — Progress Notes (Addendum)
NUTRITION FOLLOW UP  Intervention:   - Advance Osmolite 1.2 by 10 ml every 4 hours to goal rate of 60 ml/hr on 1/22 if tolerated overnight feedings. -Tube feeding regimen provides 1872 kcal (100% of needs), 86 grams of protein, and 1279 ml of H2O.   -Once pt tolerating goal rate of 60 ml/hr, transition to bolus feedings as follows:  -Initiate 120 ml at first bolus, advance by 120 ml every 4 hours until goal rate of 474 ml bolus feedings (2 cans) reached.  EX:0800 120 ml        1200 240 ml        1600 360 ml         2000 480 ml (2 cans)  -Recommend 474 ml bolus feeds (2 cans) TID + Pro-Stat 30 ml Q24H. Flush 60 ml before and after each bolus feed, will require additional 670 ml free water flushes (~240 ml TID) -Osmolite 1.2 at goal rate of 6 cans daily + Pro-Stat 30 ml once daily will provide 1810 kcal (100% est kcal needs), 95 gram protein (100% est protein needs), and 1170 ml free water.  -Continue with refeeding labs (phos/k/mg) for next 48 hrs  Nutrition Dx:   Inadequate oral intake related to dysphagia/decreased appetite as evidenced by 25 lb wt loss in 3 months, PO intake < 75%.   Goal:   TF to meet >/= 90% of their estimated nutrition needs    Monitor:   TF order, TF tolerance, total protein/energy intake, labs, weights, swallow profile  Assessment:   1/18: -Pt NPO. Family in room confirmed pt unable to swallow, currently waiting to discuss plan with Oncology regarding palliative surgery vs chemo and radiation. Will continue to monitor POC for nutrition interventions as warranted/tolerated. -MD noted pt with ongoing decreased appetite/dysphagia for past 1-2 months, was consuming liquids or semi soft foods. -Endorsed ongoing weight loss of ~20 lbs. Previous medical records indicate wt loss of 25 lb in past 3 months (13% body weight loss, severe for time frame) -Medical records indicate pt with prior hx of ETOH abuse  -Hypoglycemic episode, fluids modified to D5  1/20: -Pt  NPO, was getting PEG tube placed during time of RD follow up. No family present in room -Per discussion with RN, TF will likely be initiated 24 hours post PEG placement -Plan for pt to receive radiation, underwent treaement on 1/19 -Placed TF recommendations to begin as continuous d/t hx of wt loss/poor PO with transition to bolus feedings as tolerated  1/21: -Received consult to initiate TF at conservative rate of 10 ml/hr to 20 ml/hr, will infuse 20 ml/hr overnight and assess tolerance for advancement on 1/22 morning -Discussed tube feeding with pt's family. Explained current advancement rate with goal of transitioning over to bolus feedings for overall quality of life. Pt and family verbalized understanding. RN present in room, and confirmed current TF advancement orders -Pt remains NPO. Will modify tube feeding needs pendings pt's ability to tolerate PO intake. Discussed pt with Case manager, recommend pt receive home health services to assist with home TF needs  1/22: -Refeeding labs WNL -Pt tolerating Osmolite 1.2 at 20 ml/hr. Residuals minimal at 100 ml.  -Pt denied nausea, vomiting or abd pain. Noted jaw pain but denied any other sign of TF intolerance. -Provided family with tube feeding nutrition booklet to assist with TF questions   Height: Ht Readings from Last 1 Encounters:  10/01/14 5' 9.5" (1.765 m)    Weight Status:   Wt Readings  from Last 1 Encounters:  10/06/14 173 lb 12.8 oz (78.835 kg)    Re-estimated needs:  Kcal:1800-2000 Protein: 90-100 gram Fluid: >/=2200 ml daily   Skin: WDL  Diet Order: Diet NPO time specified Except for: Other (See Comments)   Intake/Output Summary (Last 24 hours) at 10/07/14 1049 Last data filed at 10/07/14 0000  Gross per 24 hour  Intake 218.33 ml  Output    550 ml  Net -331.67 ml    Last BM: 1/18   Labs:   Recent Labs Lab 10/04/14 0500 10/05/14 0510 10/07/14 0427  NA 142 138 138  K 3.5 3.5 3.5  CL 104 100 100   CO2 30 29 32  BUN 19 13 18   CREATININE 0.60 0.59 0.60  CALCIUM 8.9 8.8 8.5  MG 2.0 2.0 2.0  PHOS  --   --  2.4  GLUCOSE 116* 121* 105*    CBG (last 3)   Recent Labs  10/07/14 0005 10/07/14 0501 10/07/14 0749  GLUCAP 106* 104* 124*    Scheduled Meds: . antiseptic oral rinse  7 mL Mouth Rinse BID  . clindamycin  300 mg Per Tube 3 times per day  . enoxaparin (LOVENOX) injection  40 mg Subcutaneous Q24H  . sodium chloride  3 mL Intravenous Q12H  . thiamine  100 mg Intravenous Daily    Continuous Infusions: . feeding supplement (OSMOLITE 1.2 CAL) 1,000 mL (10/06/14 1530)    Atlee Abide MS RD LDN Clinical Dietitian [phone]

## 2014-10-07 NOTE — Progress Notes (Signed)
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 He was admitted for further management. Seen by oncology and interventional radiology Peg placed 1/20  Past medical history:  Past Medical History  Diagnosis Date  . Rheumatic fever age 39  . Allergic rhinitis   . Strabismus   . Cancer     lung ca dx'd 2006    Consultants:   Discussed with Dr. Constance Holster with ENT. Oncology is following.   General surgery  Procedures:   PEG tube  Antibiotics: Zosyn 1/16  Subjective:  Well No concerns voiced today tolerating tube feeds fairly-minimum residuals   Objective: Vital Signs  Filed Vitals:   10/06/14 1011 10/06/14 1500 10/06/14 2050 10/07/14 0003  BP:  126/66 132/66 132/71  Pulse:  68 68 68  Temp:  98 F (36.7 C) 98.6 F (37 C) 98.1 F (36.7 C)  TempSrc:  Oral Oral Oral  Resp:  18 18   Height:      Weight: 78.835 kg (173 lb 12.8 oz)     SpO2:  93% 94% 92%    Intake/Output Summary (Last 24 hours) at 10/07/14 1405 Last data filed at 10/07/14 0000  Gross per 24 hour  Intake 218.33 ml  Output    350 ml  Net -131.67 ml   Filed Weights   10/01/14 1656 10/06/14 1011  Weight: 73.936 kg (163 lb) 78.835 kg (173 lb 12.8 oz)    General appearance: alert, cooperative, appears stated age and no distress Resp: Coarse breath sounds bilaterally. No wheezing Rales or rhonchi. Cardio: regular rate and rhythm, S1, S2 normal, no murmur, click, rub or gallop GI: soft, non-tender; bowel sounds normal; no masses,  no organomegaly Extremities: extremities normal, atraumatic, no cyanosis or edema   Lab Results:  Basic Metabolic Panel:  Recent Labs Lab 10/02/14 0713  10/03/14 0440 10/04/14 0500 10/05/14 0510 10/07/14 0427  NA 140 143 142 138 138  K 4.0 3.8 3.5 3.5 3.5  CL 100 104 104 100 100  CO2 26 27 30 29  32  GLUCOSE 80 68* 116* 121* 105*  BUN 22 23 19 13 18   CREATININE 0.66 0.57 0.60 0.59 0.60  CALCIUM 9.0 9.0 8.9 8.8 8.5  MG  --   --  2.0 2.0 2.0  PHOS  --   --   --   --  2.4   Liver Function Tests:  Recent Labs Lab 10/01/14 1231 10/02/14 0713 10/07/14 0427  AST 27 33 19  ALT 21 22 18   ALKPHOS 145* 123* 77  BILITOT 1.1 0.8 0.5  PROT 7.1 6.3 5.7*  ALBUMIN 3.3* 2.9* 2.5*   CBC:  Recent Labs Lab 10/01/14 1231 10/02/14 0539 10/03/14 0440 10/04/14 0500 10/05/14 0510 10/07/14 0427  WBC 20.0* 17.7* 13.2* 12.7* 13.7* 17.9*  NEUTROABS 16.9*  --   --   --   --  15.0*  HGB 14.9 13.1 12.2* 12.6* 12.8* 11.7*  HCT 43.9 38.8* 38.7* 38.3* 38.3* 35.0*  MCV 96.5 96.8 99.5 98.2 97.0 95.4  PLT 471* 402* 434* 451* 478* 397     Recent Results (from the past 240 hour(s))  Culture, blood (routine x 2)     Status: None   Collection Time: 10/01/14  6:26  PM  Result Value Ref Range Status   Specimen Description BLOOD RIGHT HAND  Final   Special Requests BOTTLES DRAWN AEROBIC AND ANAEROBIC 10 CC EACH  Final   Culture   Final    NO GROWTH 5 DAYS Performed at [facility]    Report Status 10/07/2014 FINAL  Final  Culture, blood (routine x 2)     Status: None   Collection Time: 10/01/14  6:26 PM  Result Value Ref Range Status   Specimen Description BLOOD RIGHT ARM  Final   Special Requests BOTTLES DRAWN AEROBIC ONLY 10 CC  Final   Culture   Final    NO GROWTH 5 DAYS Note: Culture results may be compromised due to an excessive volume of blood received in culture bottles. Performed at [facility]    Report Status 10/07/2014 FINAL  Final  Surgical pcr screen     Status: None   Collection Time: 10/05/14 11:12 AM  Result Value Ref Range Status   MRSA, PCR NEGATIVE NEGATIVE Final   Staphylococcus aureus NEGATIVE NEGATIVE  Final    Comment:        The Xpert SA Assay (FDA approved for NASAL specimens in patients over 73 years of age), is one component of a comprehensive surveillance program.  Test performance has been validated by [facility] for patients greater than or equal to 39 year old. It is not intended to diagnose infection nor to guide or monitor treatment.       Studies/Results: Dg Chest 2 View  10/06/2014   CLINICAL DATA:  Followup pneumonia  EXAM: CHEST  2 VIEW  COMPARISON:  10/01/2014  FINDINGS: Cardiomediastinal silhouette is stable. Persistent small right pleural effusion. There is right base posteriorly atelectasis or infiltrate. Stable postsurgical changes left upper lobe. Again noted left lower lobe nodules.  IMPRESSION: Persistent small right pleural effusion. Right base posterior atelectasis or infiltrate. No pulmonary edema. Again noted left lower lobe nodules.   Electronically Signed   By: Lahoma Crocker M.D.   On: 10/06/2014 08:50    Medications:  Scheduled: . antiseptic oral rinse  7 mL Mouth Rinse BID  . clindamycin  300 mg Per Tube 3 times per day  . enoxaparin (LOVENOX) injection  40 mg Subcutaneous Q24H  . sodium chloride  3 mL Intravenous Q12H  . thiamine  100 mg Intravenous Daily   Continuous: . feeding supplement (OSMOLITE 1.2 CAL) 1,000 mL (10/06/14 1530)   JSE:[id number], albuterol, morphine injection, ondansetron (ZOFRAN) IV, oxyCODONE  Assessment/Plan:  Principal Problem:   Squamous cell cancer of hypopharynx Active Problems:   Multiple lung nodules   Head and neck cancer   Mechanical dysphagia   Alcohol abuse   Leukocytosis   Dehydration   Weight loss   Protein-calorie malnutrition, severe    Mechanical dysphagia This is secondary to his head and neck cancer in the hypopharynx.  CT scan of his neck is as above.  There appears to be an obstruction of the esophageal opening .Dr. [place] Pink d/w with Dr. Constance Holster 1/18 .  Oncology is following- PEG placed  1/20 by Gen surg  Fever No further episodes of fever. Blood cultures are negative so far.  He was started on Zosyn due to possibility of infection around this tumor vs Aspiration  WBC is improved. I have transitioned him on 1/21 to clindamycin via PEG tube to complete a course of treatment for potential aspiration/paraesophageal abcess on 10/10/14 which would be 10 days total treatment  Squamous cell  cancer of head and neck Radiation oncologist to see.  Per Dr. Lyman Speller discussion with Dr. Sabra Heck is no role for palliative surgery. Oncology input appreciated Radiation-oncology treatment-as per Dr. Mercy Moore, "quad shot"-will receive radiation treatment in the morning and afternoon of Monday, 10/10/2014 and Tuesday, 10/11/2014 followed by a four-week break with possible repeat treatments for a total of 3 cycles.  [place] in [place]-35 minutes away, and there is inclement weather-patient will stay hospitalized until completion of treatment  NSVT Patient remains asymptomatic. He does not have any known history of heart disease.  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 Continue with IV fluids. NPO.  Glucose level is improved after IV fluids were changed to D5. Patient has started tube feeds which are being advanced slowly as per nutritionist to prevent refeeding syndrome Refeeding labs are within normal limits Titrate feeds up to goal by 1/23 or 1/24.  Leukocytosis This has been present for the last month and a half. Counts are improved with Zosyn. We have no source other than microaspiration versus praesophageal abscess   Alcohol abuse He drinks moderate to significant amounts of beer and wine. No signs of withdrawal. Continue telemetry and CIWA protocol. Thiamine will be given intravenously.  Previous history of lung  cancer with new pulmonary nodules Lung cancer was diagnosed originally in 2006/7. He underwent surgery (?pneumonectomy) on both his lungs and had radiation treatment. He's never had chemotherapy. Management per oncology. The pulmonary nodules detected recently could be metastatic process from his head and neck cancer.  DVT Prophylaxis: Lovenox    Code Status: Full code for now  Family CommunicationDiscussed with daughter at bedside Disposition Plan: inpatient pending resolution and increase in tube feeds- Family has been told by radiation oncology that have the option if medically necessary of being able to stay in the hospital until completion of XRT

## 2014-10-08 LAB — GLUCOSE, CAPILLARY
GLUCOSE-CAPILLARY: 117 mg/dL — AB (ref 70–99)
GLUCOSE-CAPILLARY: 134 mg/dL — AB (ref 70–99)
Glucose-Capillary: 98 mg/dL (ref 70–99)
Glucose-Capillary: 109 mg/dL — ABNORMAL HIGH (ref 70–99)

## 2014-10-08 LAB — CBC WITH DIFFERENTIAL/PLATELET
Basophils Absolute: 0.1 10*3/uL (ref 0.0–0.1)
Basophils Relative: 0 % (ref 0–1)
EOS ABS: 0.2 10*3/uL (ref 0.0–0.7)
Eosinophils Relative: 1 % (ref 0–5)
HEMATOCRIT: 37.2 % — AB (ref 39.0–52.0)
Hemoglobin: 12.3 g/dL — ABNORMAL LOW (ref 13.0–17.0)
Lymphocytes Relative: 7 % — ABNORMAL LOW (ref 12–46)
Lymphs Abs: 1.2 10*3/uL (ref 0.7–4.0)
MCH: 31.8 pg (ref 26.0–34.0)
MCHC: 33.1 g/dL (ref 30.0–36.0)
MCV: 96.1 fL (ref 78.0–100.0)
MONO ABS: 1.6 10*3/uL — AB (ref 0.1–1.0)
MONOS PCT: 9 % (ref 3–12)
NEUTROS ABS: 14.8 10*3/uL — AB (ref 1.7–7.7)
NEUTROS PCT: 83 % — AB (ref 43–77)
Platelets: 446 10*3/uL — ABNORMAL HIGH (ref 150–400)
RBC: 3.87 MIL/uL — AB (ref 4.22–5.81)
RDW: 13.1 % (ref 11.5–15.5)
WBC: 17.8 10*3/uL — ABNORMAL HIGH (ref 4.0–10.5)

## 2014-10-08 LAB — COMPREHENSIVE METABOLIC PANEL
ALBUMIN: 2.5 g/dL — AB (ref 3.5–5.2)
ALT: 19 U/L (ref 0–53)
AST: 18 U/L (ref 0–37)
Alkaline Phosphatase: 86 U/L (ref 39–117)
Anion gap: 6 (ref 5–15)
BUN: 15 mg/dL (ref 6–23)
CHLORIDE: 101 mmol/L (ref 96–112)
CO2: 30 mmol/L (ref 19–32)
CREATININE: 0.56 mg/dL (ref 0.50–1.35)
Calcium: 8.3 mg/dL — ABNORMAL LOW (ref 8.4–10.5)
GFR calc Af Amer: >90 mL/min (ref >90)
GFR calc non Af Amer: >90 mL/min (ref >90)
GLUCOSE: 141 mg/dL — AB (ref 70–99)
Potassium: 3.4 mmol/L — ABNORMAL LOW (ref 3.5–5.1)
SODIUM: 137 mmol/L (ref 135–145)
TOTAL PROTEIN: 5.9 g/dL — AB (ref 6.0–8.3)
Total Bilirubin: 0.5 mg/dL (ref 0.3–1.2)

## 2014-10-08 LAB — PHOSPHORUS: PHOSPHORUS: 2.7 mg/dL (ref 2.3–4.6)

## 2014-10-08 LAB — PROTIME-INR
INR: 1.13 (ref 0.00–1.49)
PROTHROMBIN TIME: 14.6 s (ref 11.6–15.2)

## 2014-10-08 LAB — MAGNESIUM: Magnesium: 2 mg/dL (ref 1.5–2.5)

## 2014-10-08 MED ORDER — ALUM & MAG HYDROXIDE-SIMETH 200-200-20 MG/5ML PO SUSP
30.0000 mL | Freq: Four times a day (QID) | ORAL | Status: DC | PRN
  Administered 2014-10-08: 30 mL
  Filled 2014-10-08: qty 30

## 2014-10-08 NOTE — Progress Notes (Signed)
Patient ID: Kyle Bauer, male   DOB: 08-Jun-1931, 81 y.o.   MRN: [id number] 3 Days Post-Op  Subjective: Doing OK.  Complains of some reflux.   Objective: Vital signs in last 24 hours: Temp:  [97.9 F (36.6 C)-98.9 F (37.2 C)] 98.9 F (37.2 C) (01/23 0537) Pulse Rate:  [68-72] 68 (01/23 0537) Resp:  [18] 18 (01/23 0537) BP: (133-151)/(57-73) 151/57 mmHg (01/23 0537) SpO2:  [92 %-96 %] 93 % (01/23 0537) Weight:  [164 lb 14.4 oz (74.798 kg)] 164 lb 14.4 oz (74.798 kg) (01/23 0542) Last BM Date: 10/03/14  Intake/Output from previous day:   Intake/Output this shift:    General appearance: alert, cooperative and no distress GI: soft, incision site looks fine. Tube functioning well.   Lab Results:   Recent Labs  10/07/14 0427 10/08/14 0525  WBC 17.9* 17.8*  HGB 11.7* 12.3*  HCT 35.0* 37.2*  PLT 397 446*    BMET  Recent Labs  10/07/14 0427 10/08/14 0525  NA 138 137  K 3.5 3.4*  CL 100 101  CO2 32 30  GLUCOSE 105* 141*  BUN 18 15  CREATININE 0.60 0.56  CALCIUM 8.5 8.3*   PT/INR  Recent Labs  10/08/14 0525  LABPROT 14.6  INR 1.13     Recent Labs Lab 10/01/14 1231 10/02/14 0713 10/07/14 0427 10/08/14 0525  AST 27 33 19 18  ALT 21 22 18 19   ALKPHOS 145* 123* 77 86  BILITOT 1.1 0.8 0.5 0.5  PROT 7.1 6.3 5.7* 5.9*  ALBUMIN 3.3* 2.9* 2.5* 2.5*     Lipase  No results found for: LIPASE   Studies/Results: No results found.  Medications: . antiseptic oral rinse  7 mL Mouth Rinse BID  . clindamycin  300 mg Per Tube 3 times per day  . enoxaparin (LOVENOX) injection  40 mg Subcutaneous Q24H  . sodium chloride  3 mL Intravenous Q12H  . thiamine  100 mg Intravenous Daily    Assessment/Plan 1. 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 Right and left upper lobe adenocarcinoma 3. Hx of colon perforation with colon resection and ostomy reversal 4. Hx of ETOH abuse 5. PCM/Dehydration  - tube feeds. 6. GERD 7. Hx of Rheumatic fever.   Add maalox for reflux.  Call for issues.     LOS: 7 days    [facility] 10/08/2014

## 2014-10-08 NOTE — Progress Notes (Signed)
Pt alert x4, v/s stable. Made Kathline Magic NP aware of the Pt's 9 beat run of VT. The Pt is asystomatic , no orders given will continue to monitor. Jeanie Sewer, RN 12:49 AM 10/08/2014

## 2014-10-08 NOTE — Progress Notes (Signed)
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 Admit c mechanical dysphagia due to the mass/? Paraesophageal abcess.  Seen by oncology and interventional radiology Peg placed 1/20 by Gen surgery  Past medical history:  Past Medical History  Diagnosis Date  . Rheumatic fever age 84  . Allergic rhinitis   . Strabismus   . Cancer     lung ca dx'd 2006    Consultants:   Discussed with Dr. Constance Holster with ENT. Oncology is following.   General surgery  Procedures:   PEG tube  Antibiotics: Zosyn 1/16  Subjective:  Well No concerns voiced today tolerating tube feeds fairly-minimum residuals   Objective: Vital Signs  Filed Vitals:   10/07/14 1447 10/07/14 2123 10/08/14 0537 10/08/14 0542  BP: 135/73 133/68 151/57   Pulse: 69 72 68   Temp: 98.1 F (36.7 C) 97.9 F (36.6 C) 98.9 F (37.2 C)   TempSrc: Oral Oral Oral   Resp: 18 18 18    Height:      Weight:    74.798 kg (164 lb 14.4 oz)  SpO2: 96% 92% 93%     Intake/Output Summary (Last 24 hours) at 10/08/14 1453 Last data filed at 10/08/14 0500  Gross per 24 hour  Intake      0 ml  Output      0 ml  Net      0 ml   Filed Weights   10/01/14 1656 10/06/14 1011 10/08/14 0542  Weight: 73.936 kg (163 lb) 78.835 kg (173 lb 12.8 oz) 74.798 kg (164 lb 14.4 oz)    General appearance: alert, cooperative, appears stated age and no distress Resp: Coarse breath sounds bilaterally. No wheezing Rales or rhonchi. Cardio: regular rate and rhythm, S1, S2 normal, no murmur, click, rub or gallop GI: soft, non-tender; bowel sounds normal; no masses,  no organomegaly Extremities: extremities normal, atraumatic, no cyanosis or edema   Lab Results:  Basic Metabolic Panel:  Recent  Labs Lab 10/03/14 0440 10/04/14 0500 10/05/14 0510 10/07/14 0427 10/08/14 0525  NA 143 142 138 138 137  K 3.8 3.5 3.5 3.5 3.4*  CL 104 104 100 100 101  CO2 27 30 29  32 30  GLUCOSE 68* 116* 121* 105* 141*  BUN 23 19 13 18 15   CREATININE 0.57 0.60 0.59 0.60 0.56  CALCIUM 9.0 8.9 8.8 8.5 8.3*  MG  --  2.0 2.0 2.0 2.0  PHOS  --   --   --  2.4 2.7   Liver Function Tests:  Recent Labs Lab 10/02/14 0713 10/07/14 0427 10/08/14 0525  AST 33 19 18  ALT 22 18 19   ALKPHOS 123* 77 86  BILITOT 0.8 0.5 0.5  PROT 6.3 5.7* 5.9*  ALBUMIN 2.9* 2.5* 2.5*   CBC:  Recent Labs Lab 10/03/14 0440 10/04/14 0500 10/05/14 0510 10/07/14 0427 10/08/14 0525  WBC 13.2* 12.7* 13.7* 17.9* 17.8*  NEUTROABS  --   --   --  15.0* 14.8*  HGB 12.2* 12.6* 12.8* 11.7* 12.3*  HCT 38.7* 38.3* 38.3* 35.0* 37.2*  MCV 99.5 98.2 97.0 95.4 96.1  PLT 434* 451* 478* 397 446*     Recent Results (from the past 240 hour(s))  Culture, blood (routine x 2)     Status: None   Collection  Time: 10/01/14  6:26 PM  Result Value Ref Range Status   Specimen Description BLOOD RIGHT HAND  Final   Special Requests BOTTLES DRAWN AEROBIC AND ANAEROBIC 10 CC EACH  Final   Culture   Final    NO GROWTH 5 DAYS Performed at [facility]    Report Status 10/07/2014 FINAL  Final  Culture, blood (routine x 2)     Status: None   Collection Time: 10/01/14  6:26 PM  Result Value Ref Range Status   Specimen Description BLOOD RIGHT ARM  Final   Special Requests BOTTLES DRAWN AEROBIC ONLY 10 CC  Final   Culture   Final    NO GROWTH 5 DAYS Note: Culture results may be compromised due to an excessive volume of blood received in culture bottles. Performed at [facility]    Report Status 10/07/2014 FINAL  Final  Surgical pcr screen     Status: None   Collection Time: 10/05/14 11:12 AM  Result Value Ref Range Status   MRSA, PCR NEGATIVE NEGATIVE Final   Staphylococcus aureus NEGATIVE NEGATIVE Final    Comment:         The Xpert SA Assay (FDA approved for NASAL specimens in patients over 58 years of age), is one component of a comprehensive surveillance program.  Test performance has been validated by [facility] for patients greater than or equal to 51 year old. It is not intended to diagnose infection nor to guide or monitor treatment.       Studies/Results: No results found.  Medications:  Scheduled: . antiseptic oral rinse  7 mL Mouth Rinse BID  . clindamycin  300 mg Per Tube 3 times per day  . enoxaparin (LOVENOX) injection  40 mg Subcutaneous Q24H  . sodium chloride  3 mL Intravenous Q12H  . thiamine  100 mg Intravenous Daily   Continuous: . feeding supplement (OSMOLITE 1.2 CAL) 1,000 mL (10/08/14 0008)   TDV:VOHYWVPXTGGYI, albuterol, alum & mag hydroxide-simeth, morphine injection, ondansetron (ZOFRAN) IV, oxyCODONE  Assessment/Plan:  Principal Problem:   Squamous cell cancer of hypopharynx Active Problems:   Multiple lung nodules   Head and neck cancer   Mechanical dysphagia   Alcohol abuse   Leukocytosis   Dehydration   Weight loss   Protein-calorie malnutrition, severe    Mechanical dysphagia This is secondary to his head and neck cancer in the hypopharynx.  Pathology confirms sq cell CA CT scan of his neck is as above.  There appears to be an obstruction of the esophageal opening Dr. [place] Pink d/w with Dr. Constance Holster 1/18 .  Oncology is following- PEG placed 1/20 by Gen surg  Fever No further episodes of fever. Blood cultures are negative so far.  He was started on Zosyn due to possibility of infection around this tumor vs Aspiration  WBC is improved. I have transitioned him on 1/21 to clindamycin via PEG tube to complete a course of treatment for potential aspiration/paraesophageal abcess on 10/10/14 which would be 10 days total treatment  Squamous cell cancer of head and neck Radiation oncologist to see.  Per Dr. Lyman Speller discussion with Dr. Constance Holster there  is no role for palliative surgery. Oncology input appreciated Radiation-oncology treatment-as per Dr. Mercy Moore, "quad shot"-will receive radiation treatment in the morning and afternoon of Monday, 10/10/2014 and Tuesday, 10/11/2014 followed by a four-week break with possible repeat treatments for a total of 3 cycles.  [place], and there is  inclement weather-patient might stay hospitalized until completion of treatment-we will evaluate this on a continuous basis on 10/09/14  NSVT Patient remains asymptomatic. He does not have any known history of heart disease.  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   Glucose level is improved after IV fluids were changed to D5-now NSL Patient has started tube feeds which are being advanced slowly as per nutritionist to prevent refeeding syndrome Refeeding labs are within normal limits so far  bolus feeds this PM-Family learning to use  Leukocytosis This has been present for the last month and a half. Counts are improved with Zosyn. We have no source other than microaspiration versus praesophageal abscess   Alcohol abuse He drinks moderate to significant amounts of beer and wine. No signs of withdrawal. Continue telemetry and CIWA protocol. Thiamine will be given intravenously.  Previous history of lung cancer with new pulmonary nodules Lung cancer was diagnosed originally in 2006/7. He underwent surgery (?pneumonectomy) on both his lungs and had radiation treatment. He's never had chemotherapy. Management per oncology. The pulmonary nodules detected recently could be metastatic process from his head and neck cancer.  DVT Prophylaxis: Lovenox    Code Status: Full code for now  Family Communication: Discussed with daughter at bedside  Disposition Plan: Inpatient  pending resolution and increase in tube feeds- Family has been told by radiation oncology that have the option if medically necessary of being able to stay in the hospital until completion of XRT  We will re-evaluate medical necessity on a day-day basis Family is aware that if patient graduates his diet to complete bolus feeds over the next 24 hours, we may be able to discharge him and his XRT can be performed as an outpatient

## 2014-10-09 LAB — BASIC METABOLIC PANEL
Anion gap: 7 (ref 5–15)
BUN: 11 mg/dL (ref 6–23)
CHLORIDE: 97 mmol/L (ref 96–112)
CO2: 33 mmol/L — ABNORMAL HIGH (ref 19–32)
CREATININE: 0.51 mg/dL (ref 0.50–1.35)
Calcium: 8.6 mg/dL (ref 8.4–10.5)
GFR calc non Af Amer: >90 mL/min (ref >90)
Glucose, Bld: 115 mg/dL — ABNORMAL HIGH (ref 70–99)
Potassium: 3.6 mmol/L (ref 3.5–5.1)
SODIUM: 137 mmol/L (ref 135–145)

## 2014-10-09 LAB — GLUCOSE, CAPILLARY
Glucose-Capillary: 105 mg/dL — ABNORMAL HIGH (ref 70–99)
Glucose-Capillary: 127 mg/dL — ABNORMAL HIGH (ref 70–99)
Glucose-Capillary: 134 mg/dL — ABNORMAL HIGH (ref 70–99)
Glucose-Capillary: 143 mg/dL — ABNORMAL HIGH (ref 70–99)

## 2014-10-09 LAB — PHOSPHORUS: Phosphorus: 2.6 mg/dL (ref 2.3–4.6)

## 2014-10-09 LAB — MAGNESIUM: MAGNESIUM: 2 mg/dL (ref 1.5–2.5)

## 2014-10-09 MED ORDER — VITAMINS A & D EX OINT
TOPICAL_OINTMENT | CUTANEOUS | Status: AC
  Administered 2014-10-09: 1
  Filled 2014-10-09: qty 5

## 2014-10-09 MED ORDER — OSMOLITE 1.2 CAL PO LIQD
480.0000 mL | Freq: Three times a day (TID) | ORAL | Status: DC
  Administered 2014-10-09: 240 mL
  Administered 2014-10-09 – 2014-10-10 (×2): 480 mL
  Administered 2014-10-10 – 2014-10-11 (×3): 237 mL
  Filled 2014-10-09 (×9): qty 480

## 2014-10-09 MED ORDER — FREE WATER
240.0000 mL | Freq: Three times a day (TID) | Status: DC
  Administered 2014-10-09: 120 mL
  Administered 2014-10-09 – 2014-10-11 (×4): 240 mL

## 2014-10-09 NOTE — Progress Notes (Signed)
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 Admit c mechanical dysphagia due to the mass/? Paraesophageal abcess.  Seen by oncology and interventional radiology Peg placed 1/20 by Gen surgery  Past medical history:  Past Medical History  Diagnosis Date  . Rheumatic fever age 15  . Allergic rhinitis   . Strabismus   . Cancer     lung ca dx'd 2006    Consultants:   Discussed with Dr. Constance Holster with ENT. Oncology is following.   General surgery  Procedures:   PEG tube  Antibiotics: Zosyn 1/16  Subjective:  No issues Pain is controlled Bolus feeds going ok Mild Nausea last pm but otherwise stable No cp/n/v/sob No chills or rigor   Objective: Vital Signs  Filed Vitals:   10/08/14 2105 10/09/14 0551 10/09/14 0558 10/09/14 1402  BP: 150/86 147/82  135/66  Pulse: 70 66  61  Temp: 98.7 F (37.1 C) 98.9 F (37.2 C)  98.3 F (36.8 C)  TempSrc: Oral Oral  Oral  Resp: 18 18  19   Height:      Weight:   74.5 kg (164 lb 3.9 oz)   SpO2: 97% 95%  95%    Intake/Output Summary (Last 24 hours) at 10/09/14 1404 Last data filed at 10/09/14 1403  Gross per 24 hour  Intake    600 ml  Output    801 ml  Net   -201 ml   Filed Weights   10/06/14 1011 10/08/14 0542 10/09/14 0558  Weight: 78.835 kg (173 lb 12.8 oz) 74.798 kg (164 lb 14.4 oz) 74.5 kg (164 lb 3.9 oz)    General appearance: alert, cooperative, no distress.  Neck slightly tender, no fluctuance  Resp:clear no added sound Cardio: regular rate and rhythm, S1, S2 normal, no murmur, click, rub or gallop GI: soft, non-tender; bowel sounds normal; no masses,  no organomegaly Extremities: extremities normal, atraumatic, no cyanosis or edema   Lab Results:  Basic Metabolic  Panel:  Recent Labs Lab 10/04/14 0500 10/05/14 0510 10/07/14 0427 10/08/14 0525 10/09/14 0533  NA 142 138 138 137 137  K 3.5 3.5 3.5 3.4* 3.6  CL 104 100 100 101 97  CO2 30 29 32 30 33*  GLUCOSE 116* 121* 105* 141* 115*  BUN 19 13 18 15 11   CREATININE 0.60 0.59 0.60 0.56 0.51  CALCIUM 8.9 8.8 8.5 8.3* 8.6  MG 2.0 2.0 2.0 2.0 2.0  PHOS  --   --  2.4 2.7 2.6   Liver Function Tests:  Recent Labs Lab 10/07/14 0427 10/08/14 0525  AST 19 18  ALT 18 19  ALKPHOS 77 86  BILITOT 0.5 0.5  PROT 5.7* 5.9*  ALBUMIN 2.5* 2.5*   CBC:  Recent Labs Lab 10/03/14 0440 10/04/14 0500 10/05/14 0510 10/07/14 0427 10/08/14 0525  WBC 13.2* 12.7* 13.7* 17.9* 17.8*  NEUTROABS  --   --   --  15.0* 14.8*  HGB 12.2* 12.6* 12.8* 11.7* 12.3*  HCT 38.7* 38.3* 38.3* 35.0* 37.2*  MCV 99.5 98.2 97.0 95.4 96.1  PLT 434* 451* 478* 397 446*     Recent Results (from the past 240 hour(s))  Culture, blood (routine x 2)     Status: None   Collection Time: 10/01/14  6:26 PM  Result Value  Ref Range Status   Specimen Description BLOOD RIGHT HAND  Final   Special Requests BOTTLES DRAWN AEROBIC AND ANAEROBIC 10 CC EACH  Final   Culture   Final    NO GROWTH 5 DAYS Performed at [facility]    Report Status 10/07/2014 FINAL  Final  Culture, blood (routine x 2)     Status: None   Collection Time: 10/01/14  6:26 PM  Result Value Ref Range Status   Specimen Description BLOOD RIGHT ARM  Final   Special Requests BOTTLES DRAWN AEROBIC ONLY 10 CC  Final   Culture   Final    NO GROWTH 5 DAYS Note: Culture results may be compromised due to an excessive volume of blood received in culture bottles. Performed at [facility]    Report Status 10/07/2014 FINAL  Final  Surgical pcr screen     Status: None   Collection Time: 10/05/14 11:12 AM  Result Value Ref Range Status   MRSA, PCR NEGATIVE NEGATIVE Final   Staphylococcus aureus NEGATIVE NEGATIVE Final    Comment:        The Xpert SA  Assay (FDA approved for NASAL specimens in patients over 70 years of age), is one component of a comprehensive surveillance program.  Test performance has been validated by [facility] for patients greater than or equal to 63 year old. It is not intended to diagnose infection nor to guide or monitor treatment.       Studies/Results: No results found.  Medications:  Scheduled: . antiseptic oral rinse  7 mL Mouth Rinse BID  . clindamycin  300 mg Per Tube 3 times per day  . enoxaparin (LOVENOX) injection  40 mg Subcutaneous Q24H  . feeding supplement (OSMOLITE 1.2 CAL)  480 mL Per Tube TID  . sodium chloride  3 mL Intravenous Q12H  . thiamine  100 mg Intravenous Daily   Continuous:   BTD:VVOHYWVPXTGGY, albuterol, alum & mag hydroxide-simeth, morphine injection, ondansetron (ZOFRAN) IV, oxyCODONE  Assessment/Plan:  Principal Problem:   Squamous cell cancer of hypopharynx Active Problems:   Multiple lung nodules   Head and neck cancer   Mechanical dysphagia   Alcohol abuse   Leukocytosis   Dehydration   Weight loss   Protein-calorie malnutrition, severe    Mechanical dysphagia This is secondary to his head and neck cancer in the hypopharynx.  Pathology confirms sq cell CA CT scan of his neck is as above.  There appears to be an obstruction of the esophageal opening Dr. [place] Pink d/w with Dr. Constance Holster 1/18 .  Oncology has set-up follow up  Fever No further episodes of fever. Blood cultures are negative so far.  He was started on Zosyn due to possibility of infection around this tumor vs Aspiration  WBC 17 range I have transitioned him on 1/21 to clindamycin via PEG tube to complete a course of treatment for potential aspiration/paraesophageal abcess on 10/10/14 which would be 10 days total treatment  Squamous cell cancer of head and neck Radiation oncologist to see.  Per Dr. Lyman Speller discussion with Dr. Constance Holster there is no role for palliative surgery. Oncology  input appreciated Radiation-oncology treatment-as per Dr. Mercy Moore, "quad shot"-will receive radiation treatment in the morning and afternoon of Monday, 10/10/2014 and Tuesday, 10/11/2014 followed by a four-week break with possible repeat treatments for a total of 3 cycles.  [place], and there is inclement weather-patient might stay hospitalized until completion of treatment-we  will evaluate this on a continuous basis on 10/09/14  NSVT Patient remains asymptomatic. He does not have any known history of heart disease.  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   Glucose level is improved after IV fluids were changed to D5-now NSL Patient has started tube feeds which are being advanced slowly as per nutritionist to prevent refeeding syndrome Refeeding labs are within normal limits so far Bolus feeds going well  Leukocytosis This has been present for the last month and a half. Counts are improved with Zosyn. We have no source other than microaspiration versus praesophageal abscess   Alcohol abuse He drinks moderate to significant amounts of beer and wine. No signs of withdrawal. Continue telemetry and CIWA protocol. Thiamine will be given intravenously.  Previous history of lung cancer with new pulmonary nodules Lung cancer was diagnosed originally in 2006/7. He underwent surgery (?pneumonectomy) on both his lungs and had radiation treatment. He's never had chemotherapy. Management per oncology. The pulmonary nodules detected recently could be metastatic process from his head and neck cancer.  DVT Prophylaxis: Lovenox    Code Status: Full code for now  Family Communication: Discussed with daughter at bedside  Disposition Plan: Inpatient pending resolution  Family has been told by radiation oncology that  have the option if medically necessary of being able to stay in the hospital until completion of XRT on 10/11/14

## 2014-10-09 NOTE — Progress Notes (Signed)
CARE MANAGEMENT NOTE 10/09/2014  Patient:  Kyle Bauer, Kyle Bauer   Account Number:  [id number]  Date Initiated:  10/03/2014  Documentation initiated by:  Dessa Phi  Subjective/Objective Assessment:   81 y/o m admitted w/sob,L neck mass.OH:KGOV ca     Action/Plan:   From home alone.family support-daughters.Has pcp,pharmacy,cane,rw.   Anticipated DC Date:  10/08/2014   Anticipated DC Plan:  [place]  CM consult      [facility] Choice  HOME HEALTH   Choice offered to / List presented to:  C-4 Adult Children        HH arranged  HH-1 RN      [place].   Status of service:  Completed, signed off Medicare Important Message given?  YES (If response is "NO", the following Medicare IM given date fields will be blank) Date Medicare IM given:  10/03/2014 Medicare IM given by:  [facility] Date Additional Medicare IM given:   Additional Medicare IM given by:    Discharge Disposition:  [place]  Per UR Regulation:  Reviewed for med. necessity/level of care/duration of stay  If discussed at [place] of Stay Meetings, dates discussed:   10/06/2014    Comments:  10/09/2014 1200 NCM spoke to pt and gave permission to speak to dtr, Maryjo [place] # [phone] or Arlyss Queen [phone]. Offered choice for [facility]. Agreeable to [facility] for [facility]. Pt has RW and 3n1 at home. Waiting orders for tube feeds and HH RN/F2F needed. Jonnie Finner RN CCM Case Mgmt phone [phone]  10/06/14 Dessa Phi RN BSN NCM [phone] POD#1 PEG tube placement.TPN today.Oncology following-palliative xrt.Recommend HHRN-TPN/PEG tube mgmnt.  10/03/14 Dessa Phi RN BSN NCM [phone] Oncology following. Monitor progress for d/c needs.Would recommend PT/OT cons when appropriate.

## 2014-10-10 ENCOUNTER — Ambulatory Visit
Admit: 2014-10-10 | Discharge: 2014-10-10 | Disposition: A | Discharge status: Home / Self Care | Payer: Medicare Other | Attending: Radiation Oncology | Admitting: Radiation Oncology

## 2014-10-10 LAB — BASIC METABOLIC PANEL
Anion gap: 9 (ref 5–15)
BUN: 8 mg/dL (ref 6–23)
CHLORIDE: 97 mmol/L (ref 96–112)
CO2: 30 mmol/L (ref 19–32)
Calcium: 8.7 mg/dL (ref 8.4–10.5)
Creatinine, Ser: 0.54 mg/dL (ref 0.50–1.35)
GFR calc non Af Amer: >90 mL/min (ref >90)
GLUCOSE: 107 mg/dL — AB (ref 70–99)
Potassium: 3.8 mmol/L (ref 3.5–5.1)
Sodium: 136 mmol/L (ref 135–145)

## 2014-10-10 LAB — CBC
HEMATOCRIT: 37.5 % — AB (ref 39.0–52.0)
HEMOGLOBIN: 12.6 g/dL — AB (ref 13.0–17.0)
MCH: 31.9 pg (ref 26.0–34.0)
MCHC: 33.6 g/dL (ref 30.0–36.0)
MCV: 94.9 fL (ref 78.0–100.0)
Platelets: 498 10*3/uL — ABNORMAL HIGH (ref 150–400)
RBC: 3.95 MIL/uL — AB (ref 4.22–5.81)
RDW: 13 % (ref 11.5–15.5)
WBC: 17.7 10*3/uL — ABNORMAL HIGH (ref 4.0–10.5)

## 2014-10-10 LAB — GLUCOSE, CAPILLARY
Glucose-Capillary: 101 mg/dL — ABNORMAL HIGH (ref 70–99)
Glucose-Capillary: 102 mg/dL — ABNORMAL HIGH (ref 70–99)
Glucose-Capillary: 121 mg/dL — ABNORMAL HIGH (ref 70–99)
Glucose-Capillary: 138 mg/dL — ABNORMAL HIGH (ref 70–99)

## 2014-10-10 NOTE — Progress Notes (Signed)
Patient verbalized being full with 2 cans of Osmolite feeding (480cc)  3x a day.   Dietician paged 2x ,no call back to this time.

## 2014-10-10 NOTE — Progress Notes (Signed)
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 Admit c mechanical dysphagia due to the mass/? Paraesophageal abcess.  Seen by oncology and interventional radiology Peg placed 1/20 by Gen surgery  Past medical history:  Past Medical History  Diagnosis Date  . Rheumatic fever age 4  . Allergic rhinitis   . Strabismus   . Cancer     lung ca dx'd 2006    Consultants:   Discussed with Dr. Constance Holster with ENT. Oncology is following.   General surgery  Procedures:   PEG tube  Antibiotics: Zosyn 1/16  Subjective:  Went for XRt this am and had pain subsequently-medicated c Morphine as well as with OXY and better now Bloated as feeds ? per RD to 480 cc and felt nausoues "i don't have a big appetite" No cp/n/v/sob No chills or rigor   Objective: Vital Signs  Filed Vitals:   10/09/14 2000 10/10/14 0514 10/10/14 0551 10/10/14 1420  BP: 163/78 148/87  151/70  Pulse: 74 66  73  Temp: 98.4 F (36.9 C) 98.4 F (36.9 C)  98.7 F (37.1 C)  TempSrc: Oral Oral  Oral  Resp: 18 18  19   Height:      Weight:   74.526 kg (164 lb 4.8 oz)   SpO2: 95% 97%  96%    Intake/Output Summary (Last 24 hours) at 10/10/14 1433 Last data filed at 10/10/14 1421  Gross per 24 hour  Intake    100 ml  Output    601 ml  Net   -501 ml   Filed Weights   10/08/14 0542 10/09/14 0558 10/10/14 0551  Weight: 74.798 kg (164 lb 14.4 oz) 74.5 kg (164 lb 3.9 oz) 74.526 kg (164 lb 4.8 oz)    General appearance: alert, cooperative, no distress.  Neck slightly tender, no fluctuance, no JVD.  Mass appears a little smaller Resp:clear no added sound Cardio: regular rate and rhythm, S1, S2 normal, no murmur, click, rub or gallop GI: soft, non-tender; bowel sounds  normal; no masses,  no organomegaly Extremities: extremities normal, atraumatic, no cyanosis or edema   Lab Results:  Basic Metabolic Panel:  Recent Labs Lab 10/04/14 0500 10/05/14 0510 10/07/14 0427 10/08/14 0525 10/09/14 0533 10/10/14 0413  NA 142 138 138 137 137 136  K 3.5 3.5 3.5 3.4* 3.6 3.8  CL 104 100 100 101 97 97  CO2 30 29 32 30 33* 30  GLUCOSE 116* 121* 105* 141* 115* 107*  BUN 19 13 18 15 11 8   CREATININE 0.60 0.59 0.60 0.56 0.51 0.54  CALCIUM 8.9 8.8 8.5 8.3* 8.6 8.7  MG 2.0 2.0 2.0 2.0 2.0  --   PHOS  --   --  2.4 2.7 2.6  --    Liver Function Tests:  Recent Labs Lab 10/07/14 0427 10/08/14 0525  AST 19 18  ALT 18 19  ALKPHOS 77 86  BILITOT 0.5 0.5  PROT 5.7* 5.9*  ALBUMIN 2.5* 2.5*   CBC:  Recent Labs Lab 10/04/14 0500 10/05/14 0510 10/07/14 0427 10/08/14 0525 10/10/14 0413  WBC 12.7* 13.7* 17.9* 17.8* 17.7*  NEUTROABS  --   --  15.0* 14.8*  --   HGB 12.6* 12.8* 11.7* 12.3* 12.6*  HCT 38.3* 38.3* 35.0* 37.2* 37.5*  MCV 98.2 97.0  95.4 96.1 94.9  PLT 451* 478* 397 446* 498*     Recent Results (from the past 240 hour(s))  Culture, blood (routine x 2)     Status: None   Collection Time: 10/01/14  6:26 PM  Result Value Ref Range Status   Specimen Description BLOOD RIGHT HAND  Final   Special Requests BOTTLES DRAWN AEROBIC AND ANAEROBIC 10 CC EACH  Final   Culture   Final    NO GROWTH 5 DAYS Performed at [facility]    Report Status 10/07/2014 FINAL  Final  Culture, blood (routine x 2)     Status: None   Collection Time: 10/01/14  6:26 PM  Result Value Ref Range Status   Specimen Description BLOOD RIGHT ARM  Final   Special Requests BOTTLES DRAWN AEROBIC ONLY 10 CC  Final   Culture   Final    NO GROWTH 5 DAYS Note: Culture results may be compromised due to an excessive volume of blood received in culture bottles. Performed at [facility]    Report Status 10/07/2014 FINAL  Final  Surgical pcr screen     Status:  None   Collection Time: 10/05/14 11:12 AM  Result Value Ref Range Status   MRSA, PCR NEGATIVE NEGATIVE Final   Staphylococcus aureus NEGATIVE NEGATIVE Final    Comment:        The Xpert SA Assay (FDA approved for NASAL specimens in patients over 67 years of age), is one component of a comprehensive surveillance program.  Test performance has been validated by [facility] for patients greater than or equal to 51 year old. It is not intended to diagnose infection nor to guide or monitor treatment.       Studies/Results: No results found.  Medications:  Scheduled: . antiseptic oral rinse  7 mL Mouth Rinse BID  . clindamycin  300 mg Per Tube 3 times per day  . enoxaparin (LOVENOX) injection  40 mg Subcutaneous Q24H  . feeding supplement (OSMOLITE 1.2 CAL)  480 mL Per Tube TID  . free water  240 mL Per Tube 3 times per day  . sodium chloride  3 mL Intravenous Q12H  . thiamine  100 mg Intravenous Daily   Continuous:   HUT:[id number], albuterol, alum & mag hydroxide-simeth, morphine injection, ondansetron (ZOFRAN) IV, oxyCODONE  Assessment/Plan:  Principal Problem:   Squamous cell cancer of hypopharynx Active Problems:   Multiple lung nodules   Head and neck cancer   Mechanical dysphagia   Alcohol abuse   Leukocytosis   Dehydration   Weight loss   Protein-calorie malnutrition, severe    Mechanical dysphagia This is secondary to his head and neck cancer in the hypopharynx.  Pathology confirms sq cell CA CT scan of his neck is as above.  There appears to be an obstruction of the esophageal opening Dr. [place] Pink d/w with Dr. Constance Holster 1/18 .  Oncology has set-up follow up  Fever No further episodes of fever. Blood cultures 1/16 are negative so far.  He was started on Zosyn due to possibility of infection around this tumor vs Aspiration  WBC 17 range I have transitioned him on 1/21 to clindamycin via PEG tube to complete a course of treatment for potential  aspiration/paraesophageal abcess on 10/10/14 which would be 10 days total treatment  Squamous cell cancer of head and neck Radiation oncologist has seen.  Per Dr. Lyman Speller discussion with Dr. Constance Holster there is no role for palliative surgery. Oncology input appreciated Radiation-oncology treatment-as  per Dr. Mercy Moore, "quad shot"-will receive radiation treatment in the morning and afternoon of Monday, 10/10/2014 and Tuesday, 10/11/2014 followed by a four-week break with possible repeat treatments for a total of 3 cycles.  [place] in [place]-35 minutes away we will complete course of XRT in-hospital  NSVT Patient remains asymptomatic. He does not have any known history of heart disease.  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   Glucose level is improved after IV fluids were changed to D5-now NSL Patient has started tube feeds-currently at Bolus feeds but a little Nauseated today Refeeding labs are within normal limits so far  Leukocytosis This has been present for the last month and a half. Counts are improved with Zosyn. We have no source other than microaspiration versus praesophageal abscess  Abx Clindamycin d/c 10/10/14  Alcohol abuse He drinks moderate to significant amounts of beer and wine.  No signs of withdrawal. Continue telemetry and CIWA protocol.   Previous history of lung cancer with new pulmonary nodules Lung cancer was diagnosed originally in 2006/7. He underwent surgery (?pneumonectomy) on both his lungs and had radiation treatment. He's never had chemotherapy. Management per oncology. The pulmonary nodules detected recently could be metastatic process from his head and neck cancer.  DVT Prophylaxis: Lovenox    Code Status: Full code for now  Family Communication: Discussed with daughter at  bedside  Disposition Plan: Inpatient pending resolution  Family has been told by radiation oncology that have the option if medically necessary of being able to stay in the hospital until completion of XRT on 10/11/14

## 2014-10-11 ENCOUNTER — Encounter: Payer: Self-pay | Admitting: Radiation Oncology

## 2014-10-11 ENCOUNTER — Ambulatory Visit: Payer: Medicare Other

## 2014-10-11 ENCOUNTER — Ambulatory Visit
Admit: 2014-10-11 | Discharge: 2014-10-11 | Disposition: A | Discharge status: Home / Self Care | Payer: Medicare Other | Attending: Radiation Oncology | Admitting: Radiation Oncology

## 2014-10-11 ENCOUNTER — Inpatient Hospital Stay
Admit: 2014-10-11 | Discharge: 2014-10-11 | Disposition: A | Discharge status: Home / Self Care | Payer: Self-pay | Attending: Radiation Oncology | Admitting: Radiation Oncology

## 2014-10-11 LAB — GLUCOSE, CAPILLARY
GLUCOSE-CAPILLARY: 106 mg/dL — AB (ref 70–99)
Glucose-Capillary: 94 mg/dL (ref 70–99)

## 2014-10-11 MED ORDER — FREE WATER: 240.0000 mL | Freq: Three times a day (TID) | Status: DC

## 2014-10-11 MED ORDER — ONDANSETRON 4 MG PO TBDP: 4.0000 mg | ORAL_TABLET | Freq: Three times a day (TID) | ORAL | Status: AC | PRN

## 2014-10-11 MED ORDER — OXYCODONE HCL 5 MG PO TABS: 5.0000 mg | ORAL_TABLET | ORAL | Status: DC | PRN

## 2014-10-11 MED ORDER — OSMOLITE 1.2 CAL PO LIQD: 360.0000 mL | Freq: Three times a day (TID) | ORAL | Status: AC

## 2014-10-11 MED ORDER — OSMOLITE 1.2 CAL PO LIQD: 360.0000 mL | Freq: Three times a day (TID) | ORAL | Status: DC

## 2014-10-11 MED ORDER — FREE WATER: 240.0000 mL | Freq: Three times a day (TID) | Status: AC

## 2014-10-11 NOTE — Progress Notes (Signed)
Discharge to home, daughters at bedside. D/c instructions and follow up appointments done and discussed with patients daughter, verbalized understanding.OIV removed by NT no s.s of infiltration or swelling noted. Patient no complaints of any pain or discomfort upon discharge.

## 2014-10-11 NOTE — Evaluation (Addendum)
Physical Therapy Evaluation-1x Patient Details Name: Kyle Bauer MRN: [id number] DOB: 1930/10/06 Today's Date: 10/11/2014   History of Present Illness  81 yo male admitted with difficulty swallowing. s/p G tube placement 10/05/14. hx lung cancer,  recent diagnosis of stage IV, hypopharynx squamous cell cancer. Pt is from home alone.   Clinical Impression  On eval, pt required Min assist for mobility-able to ambulate ~75 feet without assistive device. Unsteady at times requiring external assist so recommended to daughter that pt use RW for safety/improved stability. Also recommend HHPT follow up if pt is agreeable to them coming out. Spoke with daughter who states she will provide 24 hour supervision.     Follow Up Recommendations Home health PT;Supervision/Assistance - 24 hour    Equipment Recommendations  None recommended by PT (pt states he has RW already)    Recommendations for Other Services       Precautions / Restrictions Precautions Precautions: Fall Restrictions Weight Bearing Restrictions: No      Mobility  Bed Mobility Overal bed mobility: Modified Independent                Transfers Overall transfer level: Needs assistance   Transfers: Sit to/from Stand Sit to Stand: Min guard         General transfer comment: close guard for safety  Ambulation/Gait Ambulation/Gait assistance: Min assist Ambulation Distance (Feet): 75 Feet (15'x1, 75'x1) Assistive device: None Gait Pattern/deviations: Step-through pattern;Decreased stride length;Drifts right/left     General Gait Details: unsteady at times requiring Min assist to stabilize. pt fatigues fairly easily. pt declined to ambulate any farther.   Stairs            Wheelchair Mobility    Modified Rankin (Stroke Patients Only)       Balance Overall balance assessment: Needs assistance         Standing balance support: No upper extremity supported;During functional activity Standing  balance-Leahy Scale: Fair                               Pertinent Vitals/Pain Pain Assessment: Faces Faces Pain Scale: Hurts even more Pain Location: jaw Pain Intervention(s): Monitored during session    Home Living Family/patient expects to be discharged to:: Private residence Living Arrangements: Alone (pt plans to d/c to daughter's home)   Type of Home: House Home Access: Stairs to enter   [employer] of Steps: 3   Home Equipment: Walker - 2 wheels;Cane - single point      Prior Function Level of Independence: Independent               Hand Dominance        Extremity/Trunk Assessment   Upper Extremity Assessment: Overall WFL for tasks assessed           Lower Extremity Assessment: Generalized weakness      Cervical / Trunk Assessment: Kyphotic  Communication   Communication: No difficulties  Cognition Arousal/Alertness: Awake/alert Behavior During Therapy: WFL for tasks assessed/performed Overall Cognitive Status: Within Functional Limits for tasks assessed                      General Comments      Exercises        Assessment/Plan    PT Assessment All further PT needs can be met in the next venue of care  PT Diagnosis Difficulty walking;Generalized weakness;Acute pain   PT Problem List  Decreased strength;Decreased activity tolerance;Decreased balance;Decreased mobility;Pain  PT Treatment Interventions DME instruction;Gait training;Functional mobility training;Therapeutic activities;Therapeutic exercise;Patient/family education;Balance training   PT Goals (Current goals can be found in the Care Plan section) Acute Rehab PT Goals Patient Stated Goal: home with daughter PT Goal Formulation: All assessment and education complete, DC therapy    Frequency Min 3X/week   Barriers to discharge        Co-evaluation               End of Session Equipment Utilized During Treatment: Gait belt Activity  Tolerance: Patient limited by fatigue Patient left: in bed;with call bell/phone within reach;with bed alarm set           Time: 4235-3614 PT Time Calculation (min) (ACUTE ONLY): 16 min   Charges:   PT Evaluation $Initial PT Evaluation Tier I: 1 Procedure PT Treatments $Gait Training: 8-22 mins   PT G Codes:        Weston Anna, MPT Pager: [phone]

## 2014-10-11 NOTE — Progress Notes (Signed)
Patient in house. Received in the clinic following final treatment. One month follow up appointment and AVS provided. Patient denies pain. Right neck swelling remains present and unchanged. No skin irritation noted to right neck. SOB continues. Reports productive cough with thick white sputum. Denies hemoptysis. Obtaining nutrition via PEG tube. No eating or drinking by mouth.

## 2014-10-11 NOTE — Progress Notes (Signed)
NUTRITION FOLLOW UP  Intervention:   TF Recommendations for D/C -Recommend 240 ml bolus feeds (1 can) 6 times daily + Pro-Stat 30 ml Q24H. Flush 30 ml before and after each bolus feed, will require additional 480 ml free water flushes (~240 ml BID) -Osmolite 1.2 at goal rate of 6 cans daily + Pro-Stat 30 ml once daily will provide 1810 kcal (100% est kcal needs), 95 gram protein (100% est protein needs), and 1170 ml free water. -RD to continue to monitor  Nutrition Dx:   Inadequate oral intake related to dysphagia/decreased appetite as evidenced by 25 lb wt loss in 3 months, PO intake < 75%.   Goal:   TF to meet >/= 90% of their estimated nutrition needs    Monitor:   TF order, TF tolerance, total protein/energy intake, labs, weights, swallow profile  Assessment:   1/18: -Pt NPO. Family in room confirmed pt unable to swallow, currently waiting to discuss plan with Oncology regarding palliative surgery vs chemo and radiation. Will continue to monitor POC for nutrition interventions as warranted/tolerated. -MD noted pt with ongoing decreased appetite/dysphagia for past 1-2 months, was consuming liquids or semi soft foods. -Endorsed ongoing weight loss of ~20 lbs. Previous medical records indicate wt loss of 25 lb in past 3 months (13% body weight loss, severe for time frame) -Medical records indicate pt with prior hx of ETOH abuse  -Hypoglycemic episode, fluids modified to D5  1/20: -Pt NPO, was getting PEG tube placed during time of RD follow up. No family present in room -Per discussion with RN, TF will likely be initiated 24 hours post PEG placement -Plan for pt to receive radiation, underwent treaement on 1/19 -Placed TF recommendations to begin as continuous d/t hx of wt loss/poor PO with transition to bolus feedings as tolerated  1/21: -Received consult to initiate TF at conservative rate of 10 ml/hr to 20 ml/hr, will infuse 20 ml/hr overnight and assess tolerance for  advancement on 1/22 morning -Discussed tube feeding with pt's family. Explained current advancement rate with goal of transitioning over to bolus feedings for overall quality of life. Pt and family verbalized understanding. RN present in room, and confirmed current TF advancement orders -Pt remains NPO. Will modify tube feeding needs pendings pt's ability to tolerate PO intake. Discussed pt with Case manager, recommend pt receive home health services to assist with home TF needs  1/22: -Refeeding labs WNL -Pt tolerating Osmolite 1.2 at 20 ml/hr. Residuals minimal at 100 ml.  -Pt denied nausea, vomiting or abd pain. Noted jaw pain but denied any other sign of TF intolerance. -Provided family with tube feeding nutrition booklet to assist with TF questions  1/26 -Verbally consulted by RN to speak with family about TF regimen for pt.  -Per family, pt is unable to tolerate 480 ml of Osmolite 1.2 for each feeding TID. -RD suggested more frequent feedings every 4 hours of 240 ml (1 can). Suggested schedule ( for example: 8am, 12pm, 4pm, 8pm, 12am, 4am). Schedule depends on family's preference. -RD also suggested possible switching formula to Osm 1.5 to decrease frequency of feedings to meet patient's needs. Family already ordered Osmolite 1.2 for discharge so they prefer to stay with that formula for right now. -RD answered any questions the family had at this time.  Height: Ht Readings from Last 1 Encounters:  10/01/14 5' 9.5" (1.765 m)    Weight Status:   Wt Readings from Last 1 Encounters:  10/11/14 156 lb (70.761 kg)  Re-estimated needs:  Kcal:1800-2000 Protein: 90-100 gram Fluid: >/=2000 ml daily   Skin: WDL  Diet Order: Diet NPO time specified Except for: Other (See Comments)   Intake/Output Summary (Last 24 hours) at 10/11/14 1440 Last data filed at 10/11/14 1300  Gross per 24 hour  Intake      3 ml  Output    200 ml  Net   -197 ml    Last BM:  1/26   Labs:   Recent Labs Lab 10/07/14 0427 10/08/14 0525 10/09/14 0533 10/10/14 0413  NA 138 137 137 136  K 3.5 3.4* 3.6 3.8  CL 100 101 97 97  CO2 32 30 33* 30  BUN 18 15 11 8   CREATININE 0.60 0.56 0.51 0.54  CALCIUM 8.5 8.3* 8.6 8.7  MG 2.0 2.0 2.0  --   PHOS 2.4 2.7 2.6  --   GLUCOSE 105* 141* 115* 107*    CBG (last 3)   Recent Labs  10/11/14 10/11/14 0605 10/11/14 1159  GLUCAP 121* 106* 94    Scheduled Meds: . antiseptic oral rinse  7 mL Mouth Rinse BID  . enoxaparin (LOVENOX) injection  40 mg Subcutaneous Q24H  . feeding supplement (OSMOLITE 1.2 CAL)  480 mL Per Tube TID  . free water  240 mL Per Tube 3 times per day  . sodium chloride  3 mL Intravenous Q12H  . thiamine  100 mg Intravenous Daily    Continuous Infusions:    Clayton Bibles, MS, RD, LDN Pager: [phone] After Hours Pager: [phone]

## 2014-10-11 NOTE — Discharge Summary (Signed)
Physician Discharge Summary  Kyle Bauer JSE:[id number] DOB: 1930/11/26 DOA: 79/16/2016  PCP: Leonard Downing, MD  Admit date: 10/01/2014 Discharge date: 10/11/2014  Time spent: 30 minutes  Recommendations for Outpatient Follow-up:  1. Consider CBC plus differential as well as Chem-12 one week 2. Pain needs to be reevaluated on a continuous basis-consider close follow-up with oncology for further recommendations for management 3. Home health has been requested to manage tube feeds as well as for physical therapy and education about tube feeds 4. Patient given prescription of narcotics on discharge for cancer related pain  Discharge Diagnoses:  Principal Problem:   Squamous cell cancer of hypopharynx Active Problems:   Multiple lung nodules   Head and neck cancer   Mechanical dysphagia   Alcohol abuse   Leukocytosis   Dehydration   Weight loss   Protein-calorie malnutrition, severe   Discharge Condition: Guarded prognosis-patient has stage IV cancer  Diet recommendation: Nothing by mouth except for PEG feeds  Filed Weights   10/09/14 0558 10/10/14 0551 10/11/14 0510  Weight: 74.5 kg (164 lb 3.9 oz) 74.526 kg (164 lb 4.8 oz) 70.761 kg (156 lb)    History of present illness:  81 year old Caucasian male with a previous history of lung cancer and newly diagnosed squamous cell cancer of the hypopharyngeal area with a mass. Admit c mechanical dysphagia due to the mass/? Paraesophageal abcess.  Seen by oncology and interventional radiology Peg placed 1/20 by [facility] Course:  Mechanical dysphagia This is secondary to his head and neck cancer in the hypopharynx.  Pathology confirms sq cell CA CT scan of his neck is as above.  There appears to be an obstruction of the esophageal opening Dr. [place] Pink d/w with Dr. Constance Holster 1/18 .  Oncology has set-up follow up  Fever No further episodes of fever. Blood cultures 1/16 are negative so far.  He was started on  Zosyn due to possibility of infection around this tumor vs Aspiration WBC 17 range I have transitioned him on 1/21 to clindamycin via PEG tube to complete a course of treatment for potential aspiration/paraesophageal abcess on 10/10/14  He has been afebrile off of antibiotics and will need follow-up I suspect his fever may be cancer related as well  Squamous cell cancer of head and neck Radiation oncologist has seen. Per Dr. Lyman Speller discussion with Dr. Constance Holster there is no role for palliative surgery. Oncology input appreciated He received on 1/25 and on 1/26 "quad shot"-fllowed by a four-week break with possible repeat treatments for a total of 3 cycles.  [place] in [place] is [place]-35 minutes away he completed course of XRT in-hospital  NSVT Patient remains asymptomatic. He does not have any known history of heart disease.  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  Glucose level is improved after IV fluids were changed to D5-now NSL Patient has started tube feeds-currently at [facility] feeds but a little Nauseated today Refeeding labs are within normal limits so far  Leukocytosis This has been present for the last month and a half. Counts improved with antibiotics We have no source other than microaspiration versus praesophageal abscess  Abx Clindamycin d/c 10/10/14  Alcohol abuse He drinks moderate to significant amounts of beer and wine.  No signs of withdrawal. Continue telemetry and CIWA protocol.   Previous history of lung cancer with new pulmonary nodules Lung cancer was diagnosed  originally in 2006/7. He underwent surgery (?pneumonectomy) on both his lungs and had radiation treatment. He's never had chemotherapy. Management per oncology. The pulmonary nodules detected recently could be metastatic process from his head and  neck cancer.  Procedures:  Radiation oncology treatments 1/25, 1/26  PEG tube placement 1/20  Consultations:  Interventional radiology  Oncology  Radiation oncology  General surgery   Discharge Exam: Filed Vitals:   10/11/14 1348  BP: 138/77  Pulse: 86  Temp: 98.3 F (36.8 C)  Resp: 18    Pain rated about 5/10 but controlled No other issues currently Tolerating the tube feeds but feels full and slightly nauseous No fever no chills  General: EOMI NCAT Cardiovascular: S1-S2 no murmur rub or gallop Respiratory: Clinically clear  Discharge Instructions   Discharge Instructions    Diet - low sodium heart healthy    Complete by:  As directed      Discharge instructions    Complete by:  As directed   Take Tube feeds 1.5 cans 3 x a day for nutrition You should also have water flushes 3 times a day Follow up with the oncologist, radiation oncologist, and other specialists to consolidate your care-they may elect to order labs as they see fit We will get physical therapy as well as home health to come out to your home     Increase activity slowly    Complete by:  As directed           Current Discharge Medication List    START taking these medications   Details  Nutritional Supplements (FEEDING SUPPLEMENT, OSMOLITE 1.2 CAL,) LIQD Place 360 mLs into feeding tube 3 (three) times daily. Qty: 1000 mL, Refills: 0    oxyCODONE (OXY IR/ROXICODONE) 5 MG immediate release tablet Take 1-2 tablets (5-10 mg total) by mouth every 4 (four) hours as needed for moderate pain, severe pain or breakthrough pain. Qty: 30 tablet, Refills: 0    Water For Irrigation, Sterile (FREE WATER) SOLN Place 240 mLs into feeding tube every 8 (eight) hours. Qty: 240 mL      CONTINUE these medications which have NOT CHANGED   Details  Aspirin-Acetaminophen (GOODYS BODY PAIN PO) Take 1 packet by mouth every 6 (six) hours as needed (for pain).     fexofenadine (ALLEGRA) 180 MG tablet Take  180 mg by mouth daily.    HYDROcodone-acetaminophen (NORCO) 7.5-325 MG per tablet Take 1 tablet by mouth 3 (three) times daily as needed for moderate pain.    ibuprofen (ADVIL,MOTRIN) 200 MG tablet Take 200 mg by mouth every 6 (six) hours as needed for mild pain.     omeprazole (PRILOSEC) 40 MG capsule Take 40 mg by mouth daily.   Associated Diagnoses: Malignant neoplasm of upper lobe, bronchus or lung    UNABLE TO FIND Weekly allergy shots   Associated Diagnoses: Head and neck cancer; Malignant neoplasm of left lung, unspecified part of lung    vitamin C (ASCORBIC ACID) 500 MG tablet Take 500 mg by mouth daily.       No Known Allergies Follow-up Information    Follow up with [facility].   Why:  Home Health RN   Contact information:   [address] [place] [address] [phone]        The results of significant diagnostics from this hospitalization (including imaging, microbiology, ancillary and laboratory) are listed below for reference.    Significant Diagnostic Studies: Dg Chest 2 View  10/06/2014  CLINICAL DATA:  Followup pneumonia  EXAM: CHEST  2 VIEW  COMPARISON:  10/01/2014  FINDINGS: Cardiomediastinal silhouette is stable. Persistent small right pleural effusion. There is right base posteriorly atelectasis or infiltrate. Stable postsurgical changes left upper lobe. Again noted left lower lobe nodules.  IMPRESSION: Persistent small right pleural effusion. Right base posterior atelectasis or infiltrate. No pulmonary edema. Again noted left lower lobe nodules.   Electronically Signed   By: Lahoma Crocker M.D.   On: 10/06/2014 08:50   Dg Chest 2 View  10/01/2014   CLINICAL DATA:  CXR for aspiration into airway. Pt complains of right sided neck swelling, generalized SOB, coughing. Hx of bilateral lung cancer, strabismus, allergic rhinitis, rheumatic fever. Prior RUL lobectomy, LUL wedge resection w/ radioactive seed, Bilateral VAT's resections.  Former smoker.  EXAM: CHEST  2 VIEW  COMPARISON:  PET-CT dated 09/07/2014.  FINDINGS: Grossly normal-sized heart. Interval small right pleural effusion.a previously demonstrated left lower lobe lung nodules. Left upper chest surgical clips and staples. Cholecystectomy clips. Old, healed left clavicle fracture.  IMPRESSION: 1. Interval small right pleural effusion. 2. Previously noted left lower lobe nodules.   Electronically Signed   By: Enrique Sack M.D.   On: 10/01/2014 14:11   Ct Soft Tissue Neck W Contrast  10/01/2014   CLINICAL DATA:  Choking on spit, shortness of breath, lung cancer 2006, recently diagnosed with throat cancer with neck mass, difficulty clearing secretions  EXAM: CT NECK WITH CONTRAST  TECHNIQUE: Multidetector CT imaging of the neck was performed using the standard protocol following the bolus administration of intravenous contrast. Sagittal and coronal MPR images reconstructed from axial data set.  CONTRAST:  135mL OMNIPAQUE IOHEXOL 300 MG/ML  SOLN IV  COMPARISON:  PET-CT 09/07/2014  FINDINGS: Pharynx and larynx: Large soft tissue mass identified at the RIGHT lateral aspect of the hypopharynx, 2.7 x 2.7 x 3.6 cm in size and extending into the RIGHT lateral aspect of the upper larynx. Mass extends posterior to the larynx into the upper esophagus where it measures approximately 4.1 x 2.0 cm image 70 extending 3.6 cm length. Mass extends through the laryngeal cartilage laterally. Mass corresponds to area of hyper metabolism on recent PET-CT.  Salivary glands: Symmetric normal appearing parotid and submandibular glands.  Thyroid: Normal appearing LEFT thyroid lobe. Compression and mild displacement of the upper pole of the RIGHT thyroid lobe by extrinsic fluid collection.  Lymph nodes: Fluid collection containing foci of gas lateral to the RIGHT hypopharynx mass question necrotic versus abscessed lymph node 2.4 x 2.2 x 4.2 cm or potentially an unusual lateral hypo pharyngeal diverticulum. More  cranially, a fluid collection with an adjacent enhancing soft tissue nodule is identified the anterior margin of the RIGHT sternocleidomastoid, chronic tumor versus necrotic lymph node, 3.5 x 3.5 x 3.5 cm. Additional normal sized anterior cervical lymph nodes bilaterally.  Vascular: Significant scattered atherosclerotic calcifications in the carotid systems including carotid bifurcations and carotid siphons. Compression of the RIGHT jugular vein. Major vascular structures appear patent.  Limited intracranial: Unremarkable  Mastoids and visualized paranasal sinuses: Clear. Concha bullosa middle turbinates. Middle ear cavities appear clear bilaterally.  Skeleton: Degenerative disc and facet disease changes cervical spine. No osseous metastases.  Upper chest: 6 mm RIGHT upper lobe nodule image 30 previously 5 mm. Postsurgical changes and anterior LEFT upper lobe with brachytherapy seed implants and scarring. Ground-glass nodule LEFT upper lobe 14 x 10 mm unchanged.  IMPRESSION: Large trans spatial tumor involving the RIGHT lateral aspect the hypopharynx and  RIGHT piriform sinus region, upper esophagus, and extending into the RIGHT larynx.  This tumor may either represent a hypo pharyngeal or esophageal origin.  Mass is likely obstructing the esophagus/hypopharynx.  Partially necrotic metastatic lymph node at anterior RIGHT sternocleidomastoid muscle.  Additional gas and fluid collection lateral to the tumor at the level of the upper larynx could represent a necrotic or abscessed nodal disease or an unusual hypo pharyngeal diverticulum.  Minimal increase in size of a RIGHT upper lobe nodule question metastasis.  Stable ground-glass nodule at LEFT apex.   Electronically Signed   By: Lavonia Dana M.D.   On: 10/01/2014 17:01   Mr Jeri Cos LD Contrast  09/22/2014   CLINICAL DATA:  Left lung cancer.  Restaging.  EXAM: MRI HEAD WITHOUT AND WITH CONTRAST  TECHNIQUE: Multiplanar, multiecho pulse sequences of the brain and  surrounding structures were obtained without and with intravenous contrast.  CONTRAST:  74mL MULTIHANCE GADOBENATE DIMEGLUMINE 529 MG/ML IV SOLN  COMPARISON:  CT head 08/02/2005  FINDINGS: Moderate atrophy. Negative for hydrocephalus. Pituitary not enlarged. Craniocervical junction normal. Calvarium intact.  Negative for acute infarct.  Chronic microvascular ischemic changes. Periventricular and deep white matter hyperintensity throughout the cerebral white matter bilaterally. Hyperintensity in the pons bilaterally. Small chronic infarcts in the right cerebellum.  Negative for hemorrhage  Negative for mass or edema  No enhancing lesions are seen postcontrast administration.  Paranasal sinuses are clear.  IMPRESSION: Negative for metastatic disease.  Atrophy and chronic microvascular ischemia.  No acute infarct.   Electronically Signed   By: Franchot Gallo M.D.   On: 09/22/2014 11:10    Microbiology: Recent Results (from the past 240 hour(s))  Culture, blood (routine x 2)     Status: None   Collection Time: 10/01/14  6:26 PM  Result Value Ref Range Status   Specimen Description BLOOD RIGHT HAND  Final   Special Requests BOTTLES DRAWN AEROBIC AND ANAEROBIC 10 CC EACH  Final   Culture   Final    NO GROWTH 5 DAYS Performed at [facility]    Report Status 10/07/2014 FINAL  Final  Culture, blood (routine x 2)     Status: None   Collection Time: 10/01/14  6:26 PM  Result Value Ref Range Status   Specimen Description BLOOD RIGHT ARM  Final   Special Requests BOTTLES DRAWN AEROBIC ONLY 10 CC  Final   Culture   Final    NO GROWTH 5 DAYS Note: Culture results may be compromised due to an excessive volume of blood received in culture bottles. Performed at [facility]    Report Status 10/07/2014 FINAL  Final  Surgical pcr screen     Status: None   Collection Time: 10/05/14 11:12 AM  Result Value Ref Range Status   MRSA, PCR NEGATIVE NEGATIVE Final   Staphylococcus aureus NEGATIVE  NEGATIVE Final    Comment:        The Xpert SA Assay (FDA approved for NASAL specimens in patients over 5 years of age), is one component of a comprehensive surveillance program.  Test performance has been validated by [facility] for patients greater than or equal to 47 year old. It is not intended to diagnose infection nor to guide or monitor treatment.      Labs: Basic Metabolic Panel:  Recent Labs Lab 10/05/14 0510 10/07/14 0427 10/08/14 0525 10/09/14 0533 10/10/14 0413  NA 138 138 137 137 136  K 3.5 3.5 3.4* 3.6 3.8  CL 100  100 101 97 97  CO2 29 32 30 33* 30  GLUCOSE 121* 105* 141* 115* 107*  BUN 13 18 15 11 8   CREATININE 0.59 0.60 0.56 0.51 0.54  CALCIUM 8.8 8.5 8.3* 8.6 8.7  MG 2.0 2.0 2.0 2.0  --   PHOS  --  2.4 2.7 2.6  --    Liver Function Tests:  Recent Labs Lab 10/07/14 0427 10/08/14 0525  AST 19 18  ALT 18 19  ALKPHOS 77 86  BILITOT 0.5 0.5  PROT 5.7* 5.9*  ALBUMIN 2.5* 2.5*   No results for input(s): LIPASE, AMYLASE in the last 168 hours. No results for input(s): AMMONIA in the last 168 hours. CBC:  Recent Labs Lab 10/05/14 0510 10/07/14 0427 10/08/14 0525 10/10/14 0413  WBC 13.7* 17.9* 17.8* 17.7*  NEUTROABS  --  15.0* 14.8*  --   HGB 12.8* 11.7* 12.3* 12.6*  HCT 38.3* 35.0* 37.2* 37.5*  MCV 97.0 95.4 96.1 94.9  PLT 478* 397 446* 498*   Cardiac Enzymes: No results for input(s): CKTOTAL, CKMB, CKMBINDEX, TROPONINI in the last 168 hours. BNP: BNP (last 3 results) No results for input(s): PROBNP in the last 8760 hours. CBG:  Recent Labs Lab 10/10/14 1133 10/10/14 1715 10/11/14 10/11/14 0605 10/11/14 1159  GLUCAP 138* 101* 121* 106* 94       Signed:  Jaquavious Mercer, JAI-GURMUKH  [facility] 10/11/2014, 4:16 PM

## 2014-10-12 ENCOUNTER — Telehealth: Payer: Self-pay | Admitting: Medical Oncology

## 2014-10-12 ENCOUNTER — Telehealth: Payer: Self-pay | Admitting: Radiation Oncology

## 2014-10-12 ENCOUNTER — Ambulatory Visit: Payer: Medicare Other

## 2014-10-12 ENCOUNTER — Telehealth: Payer: Self-pay | Admitting: *Deleted

## 2014-10-12 NOTE — Telephone Encounter (Signed)
Faxed PACEMAKER/ICD FORM to [facility]. Confirmation fax of delivery obtained.

## 2014-10-12 NOTE — Telephone Encounter (Signed)
Faxed signed osmolite order to [facility]. Confirmation fax of delivery obtained.

## 2014-10-12 NOTE — Telephone Encounter (Signed)
I instructed Jarrett Soho to use imodium for diarrhea. Pt osmolyte for tube feeding may be delayed a day or 2 and RN asking me for recommendations for supplement until osmolyte delivered. I will forward note to barb , but i instructed nurse to ask her dietician for recommendation  also

## 2014-10-12 NOTE — Telephone Encounter (Signed)
Jarrett Soho RN with [facility] [phone]) called with the following questions.  Admitting patient after hospital D/C yesterday.  1. What can they use for diarrhea? 2. What can they use in the meantime while awaiting osmolyte delivery?  Call forwarded to collabrative.

## 2014-10-13 ENCOUNTER — Telehealth (INDEPENDENT_AMBULATORY_CARE_PROVIDER_SITE_OTHER): Payer: Self-pay | Admitting: Surgery

## 2014-10-13 ENCOUNTER — Telehealth: Payer: Self-pay | Admitting: Radiation Oncology

## 2014-10-13 ENCOUNTER — Telehealth: Payer: Self-pay | Admitting: Medical Oncology

## 2014-10-13 ENCOUNTER — Ambulatory Visit: Payer: Medicare Other

## 2014-10-13 NOTE — Telephone Encounter (Signed)
Kyle Bauer has a gastrostomy tube placed for an esophageal cancer by Dr. Marcello Moores.  He went home about 2 days ago.  His wife called because he had high residuals on his tube feedings.  She pulled out 3 1/2 syringes (175cc?).  He was not nauseated, but did do a lot of PT today.  I told her to give him water only and restart his routine feedings tomorrow.  She will call back if the high residuals persist.  Alphonsa Overall, MD, [facility] Pager: [phone] Office phone:  [phone]

## 2014-10-13 NOTE — Telephone Encounter (Signed)
Phoned patient's daughter, Izell Tucumcari, house. Spoke with Jenny Reichmann, the other daughter. Explained that Dr. Tammi Klippel would like to re-evaluate her Dad on  11/04/2014 at 0900. She verbalized understanding. Additionally, she inquires about how to obtain a refill of her Dad's oxycodone. She explains that Dr. Verlon Au prescribed 30 tablets on 10/11/2014. She goes on to say they give her Dad two tablets every four hours via PEG tube and liquid childrens ibuprofen for break through pain. She reports her Dad has just 12 tablet left and request Dr. Tammi Klippel refill this.

## 2014-10-13 NOTE — Telephone Encounter (Signed)
Faxed order

## 2014-10-14 ENCOUNTER — Other Ambulatory Visit: Payer: Self-pay | Admitting: Radiation Oncology

## 2014-10-14 ENCOUNTER — Ambulatory Visit: Payer: Medicare Other

## 2014-10-14 ENCOUNTER — Telehealth: Payer: Self-pay | Admitting: Radiation Oncology

## 2014-10-14 DIAGNOSIS — C139 Malignant neoplasm of hypopharynx, unspecified: Secondary | ICD-10-CM

## 2014-10-14 MED ORDER — OXYCODONE HCL 5 MG PO TABS: 5.0000 mg | ORAL_TABLET | ORAL | Status: DC | PRN

## 2014-10-14 NOTE — Telephone Encounter (Signed)
Phoned Lattie Haw, patient's daughter. Informed her oxycodone script is ready for pick up in the rad onc nursing station. Instructed her to bring her license with her to pick up script. She verbalized understanding and expressed appreciation for the call.

## 2014-10-17 ENCOUNTER — Other Ambulatory Visit: Payer: Self-pay | Admitting: Radiation Oncology

## 2014-10-17 ENCOUNTER — Ambulatory Visit: Payer: Medicare Other

## 2014-10-17 ENCOUNTER — Telehealth: Payer: Self-pay | Admitting: Radiation Oncology

## 2014-10-17 ENCOUNTER — Ambulatory Visit: Payer: Medicare Other | Admitting: Radiation Oncology

## 2014-10-17 NOTE — Telephone Encounter (Signed)
Received a call from the patient's daughter, Kyle Bauer. She reports her father hasn't had a bowel movement in four days. She denies that her dad is complaining of abdominal pain, nausea or vomiting. She explained a home health nurse will be out to see her father tomorrow. Encouraged her to discuss this constipation with the home health nurse to ensure her dad isn't impacted before using a laxative or magnesium citrate.  Advised that once constipation is resolved to start giving her dad Colace bid. Also, she reports that her father seems more confused in the last 24 hours. Encouraged her to discuss this with home health nurse as well. Brain MRI from 09/22/2014 was normal. Explained that a urinary tract infection could be the cause. Angie verbalized understanding of all discussed.

## 2014-10-18 ENCOUNTER — Other Ambulatory Visit: Payer: Self-pay | Admitting: *Deleted

## 2014-10-18 ENCOUNTER — Ambulatory Visit: Payer: Medicare Other

## 2014-10-18 ENCOUNTER — Telehealth: Payer: Self-pay | Admitting: *Deleted

## 2014-10-18 ENCOUNTER — Telehealth: Payer: Self-pay | Admitting: Radiation Oncology

## 2014-10-18 DIAGNOSIS — R1310 Dysphagia, unspecified: Secondary | ICD-10-CM | POA: Insufficient documentation

## 2014-10-18 DIAGNOSIS — T17908A Unspecified foreign body in respiratory tract, part unspecified causing other injury, initial encounter: Secondary | ICD-10-CM | POA: Insufficient documentation

## 2014-10-18 NOTE — Telephone Encounter (Signed)
Milda Smart, RN for [facility] called. She reports the patient hasn't had a bowel movement in five days. However, bowel sounds are present and his "tummy is soft." She reports that she instructed the daughters to give him 1/2 of a bottle of magnesium citrate today and the other half if he hasn't moved his bowels in 24 hours. Inez Catalina reports she will visit the patient again Thursday. No orders were requested.

## 2014-10-18 NOTE — Telephone Encounter (Signed)
Late nurse today. Arrived at 0830. Retrieved two telephone messages. One from [place], [place] and the other from [place], patient's daughter. Both wanting orders for this patient. Noted patient has PCP, Dr. Leonard Downing. Phoned Dessa Phi, RN case manager that correlated [place]. She explains Dr. Tammi Klippel was assigned to provide orders because Dr. Arelia Sneddon doesn't. Explained Dr. Tammi Klippel nor this RN were notified of such. Mal Amabile, PT of [facility] back at [phone]. She is requesting an order to increase PT from once per week to twice per week because balance and cognition have declined. Reached out to [employer], Production assistant, radio for [employer]. Determined from [place] that Milda Smart is the RN assigned to this patient's case. Explained to Cyril Mourning that the patient's daughter, Janace Hoard, left a message this morning wanting orders to resolve her father's constipation. Cyril Mourning verbalized she will reach out to Milda Smart and have her contact this RN reference patient's needs.

## 2014-10-18 NOTE — Telephone Encounter (Signed)
Message received on VM in [place] left at 947am from [facility] with [facility] requesting a return call "have a question about his care "  Return call number number given as [phone].  Noted call from same nurse with RN in [place].  This RN returned call to [place] and obtained identified VM- message left requesting a return call to [facility] for concerns.  This note will be forwarded to RN at desk for communication purposes only in case she receives call directly.

## 2014-10-19 ENCOUNTER — Ambulatory Visit: Payer: Medicare Other

## 2014-10-20 ENCOUNTER — Emergency Department (HOSPITAL_COMMUNITY)
Admission: EM | Admit: 2014-10-20 | Discharge: 2014-10-20 | Disposition: A | Discharge status: Home / Self Care | Payer: Medicare Other | Attending: Emergency Medicine | Admitting: Emergency Medicine

## 2014-10-20 ENCOUNTER — Emergency Department (HOSPITAL_COMMUNITY): Payer: Medicare Other

## 2014-10-20 ENCOUNTER — Telehealth: Payer: Self-pay | Admitting: *Deleted

## 2014-10-20 ENCOUNTER — Ambulatory Visit: Payer: Medicare Other

## 2014-10-20 ENCOUNTER — Encounter (HOSPITAL_COMMUNITY): Payer: Self-pay | Admitting: Emergency Medicine

## 2014-10-20 DIAGNOSIS — C3490 Malignant neoplasm of unspecified part of unspecified bronchus or lung: Secondary | ICD-10-CM | POA: Diagnosis not present

## 2014-10-20 DIAGNOSIS — Z8709 Personal history of other diseases of the respiratory system: Secondary | ICD-10-CM | POA: Diagnosis not present

## 2014-10-20 DIAGNOSIS — Z431 Encounter for attention to gastrostomy: Secondary | ICD-10-CM | POA: Diagnosis present

## 2014-10-20 DIAGNOSIS — Z87891 Personal history of nicotine dependence: Secondary | ICD-10-CM | POA: Insufficient documentation

## 2014-10-20 DIAGNOSIS — K9429 Other complications of gastrostomy: Secondary | ICD-10-CM | POA: Insufficient documentation

## 2014-10-20 DIAGNOSIS — Z79899 Other long term (current) drug therapy: Secondary | ICD-10-CM | POA: Insufficient documentation

## 2014-10-20 DIAGNOSIS — C159 Malignant neoplasm of esophagus, unspecified: Secondary | ICD-10-CM | POA: Diagnosis not present

## 2014-10-20 DIAGNOSIS — Z8669 Personal history of other diseases of the nervous system and sense organs: Secondary | ICD-10-CM | POA: Insufficient documentation

## 2014-10-20 DIAGNOSIS — K9420 Gastrostomy complication, unspecified: Secondary | ICD-10-CM

## 2014-10-20 LAB — I-STAT CHEM 8, ED
BUN: 10 mg/dL (ref 6–23)
CHLORIDE: 89 mmol/L — AB (ref 96–112)
Calcium, Ion: 1.14 mmol/L (ref 1.13–1.30)
Creatinine, Ser: 0.6 mg/dL (ref 0.50–1.35)
GLUCOSE: 125 mg/dL — AB (ref 70–99)
HEMATOCRIT: 42 % (ref 39.0–52.0)
HEMOGLOBIN: 14.3 g/dL (ref 13.0–17.0)
POTASSIUM: 4.3 mmol/L (ref 3.5–5.1)
Sodium: 130 mmol/L — ABNORMAL LOW (ref 135–145)
TCO2: 31 mmol/L (ref 0–100)

## 2014-10-20 MED ORDER — HYDROMORPHONE HCL 1 MG/ML IJ SOLN
0.5000 mg | Freq: Once | INTRAMUSCULAR | Status: AC
  Administered 2014-10-20: 0.5 mg via INTRAVENOUS
  Filled 2014-10-20: qty 1

## 2014-10-20 MED ORDER — LIDOCAINE VISCOUS 2 % MT SOLN
OROMUCOSAL | Status: AC
  Administered 2014-10-20: 5 mL
  Filled 2014-10-20: qty 15

## 2014-10-20 MED ORDER — IOHEXOL 300 MG/ML  SOLN
10.0000 mL | Freq: Once | INTRAMUSCULAR | Status: AC | PRN
  Administered 2014-10-20: 10 mL

## 2014-10-20 MED ORDER — SODIUM CHLORIDE 0.9 % IV BOLUS (SEPSIS)
1000.0000 mL | Freq: Once | INTRAVENOUS | Status: AC
  Administered 2014-10-20: 1000 mL via INTRAVENOUS

## 2014-10-20 MED ORDER — MORPHINE SULFATE 4 MG/ML IJ SOLN
4.0000 mg | Freq: Once | INTRAMUSCULAR | Status: AC
Start: 2014-10-20 — End: 2014-10-20
  Administered 2014-10-20: 4 mg via INTRAVENOUS
  Filled 2014-10-20: qty 1

## 2014-10-20 MED ORDER — LORAZEPAM 2 MG/ML IJ SOLN
0.5000 mg | Freq: Once | INTRAMUSCULAR | Status: AC
Start: 2014-10-20 — End: 2014-10-20
  Administered 2014-10-20: 0.5 mg via INTRAVENOUS
  Filled 2014-10-20: qty 1

## 2014-10-20 NOTE — ED Notes (Signed)
Bed: KW40 Expected date:  Expected time:  Means of arrival:  Comments: Pt in IR

## 2014-10-20 NOTE — ED Notes (Signed)
Pt returned from IR new g-tube in place abd binder also in place.

## 2014-10-20 NOTE — ED Notes (Signed)
MD at bedside. 

## 2014-10-20 NOTE — Discharge Instructions (Signed)
Return to the emergency room with worsening of symptoms, new symptoms or with symptoms that are concerning. Read below information and follow all recommendations Please call your doctor for a followup appointment within 24-48 hours. When you talk to your doctor please let them know that you were seen in the emergency department and have them acquire all of your records so that they can discuss the findings with you and formulate a treatment plan to fully care for your new and ongoing problems.   Gastrostomy Tube Home Guide A gastrostomy tube is a tube that is surgically placed into the stomach. It is also called a "G-tube." G-tubes are used when a person is unable to eat and drink enough on their own to stay healthy. The tube is inserted into the stomach through a small cut (incision) in the skin. This tube is used for:  Feeding.  Giving medication. GASTROSTOMY TUBE CARE 1. Wash your hands with soap and water. 2. Remove the old dressing (if any). Some styles of G-tubes may need a dressing inserted between the skin and the G-tube. Other types of G-tubes do not require a dressing. Ask your health care provider if a dressing is needed. 3. Check the area where the tube enters the skin (insertion site) for redness, swelling, or pus-like (purulent) drainage. A small amount of clear or tan liquid drainage is normal. Check to make sure scar tissue (skin) is not growing around the insertion site. This could have a raised, bumpy appearance. 4. A cotton swab can be used to clean the skin around the tube: 1. When the G-tube is first put in, a normal saline solution or water can be used to clean the skin. 2. Mild soap and warm water can be used when the skin around the G-tube site has healed. 3. Roll the cotton swab around the G-tube insertion site to remove any drainage or crusting at the insertion site. STOMACH RESIDUALS Feeding tube residuals are the amount of liquids that are in the stomach at any given  time. Residuals may be checked before giving feedings, medications, or as instructed by your health care provider.  Ask your health care provider if there are instances when you would not start tube feedings depending on the amount or type of contents withdrawn from the stomach.  Check residuals by attaching a syringe to the G-tube and pulling back on the syringe plunger. Note the amount, and return the residual back into the stomach. FLUSHING THE G-TUBE 1. The G-tube should be periodically flushed with clean warm water to keep it from clogging.  Flush the G-tube after feedings or medications. Draw up 30 mL of warm water in a syringe. Connect the syringe to the G-tube and slowly push the water into the tube.  Do not push feedings, medications, or flushes rapidly. Flush the G-tube gently and slowly.  Only use syringes made for G-tubes to flush medications or feedings.  Your health care provider may want the G-tube flushed more often or with more water. If this is the case, follow your health care provider's instructions. FEEDINGS Your health care provider will determine whether feedings are given as a bolus (a certain amount given at one time and at scheduled times) or whether feedings will be given continuously on a feeding pump.   Formulas should be given at room temperature.  If feedings are continuous, no more than 4 hours worth of feedings should be placed in the feeding bag. This helps prevent spoilage or accidental excess infusion.  Cover and place unused formula in the refrigerator.  If feedings are continuous, stop the feedings when medications or flushes are given. Be sure to restart the feedings.  Feeding bags and syringes should be replaced as instructed by your health care provider. GIVING MEDICATION   In general, it is best if all medications are in a liquid form for G-tube administration. Liquid medications are less likely to clog the G-tube.  Mix the liquid medication  with 30 mL (or amount recommended by your health care provider) of warm water.  Draw up the medication into the syringe.  Attach the syringe to the G-tube and slowly push the mixture into the G-tube.  After giving the medication, draw up 30 mL of warm water in the syringe and slowly flush the G-tube.  For pills or capsules, check with your health care provider first before crushing medications. Some pills are not effective if they are crushed. Some capsules are sustained-release medications.  If appropriate, crush the pill or capsule and mix with 30 mL of warm water. Using the syringe, slowly push the medication through the tube, then flush the tube with another 30 mL of tap water. G-TUBE PROBLEMS G-tube was pulled out.  Cause: May have been pulled out accidentally.  Solutions: Cover the opening with clean dressing and tape. Call your health care provider right away. The G-tube should be put in as soon as possible (within 4 hours) so the G-tube opening (tract) does not close. The G-tube needs to be put in at a health care setting. An X-ray needs to be done to confirm placement before the G-tube can be used again. Redness, irritation, soreness, or foul odor around the gastrostomy site.  Cause: May be caused by leakage or infection.  Solutions: Call your health care provider right away. Large amount of leakage of fluid or mucus-like liquid present (a large amount means it soaks clothing).  Cause: Many reasons could cause the G-tube to leak.  Solutions: Call your health care provider to discuss the amount of leakage. Skin or scar tissue appears to be growing where tube enters skin.   Cause: Tissue growth may develop around the insertion site if the G-tube is moved or pulled on excessively.  Solutions: Secure tube with tape so that excess movement does not occur. Call your health care provider. G-tube is clogged.  Cause: Thick formula or medication.  Solutions: Try to slowly push warm  water into the tube with a large syringe. Never try to push any object into the tube to unclog it. Do not force fluid into the G-tube. If you are unable to unclog the tube, call your health care provider right away. TIPS  Head of bed (HOB) position refers to the upright position of a person's upper body.  When giving medications or a feeding bolus, keep the [facility] up as told by your health care provider. Do this during the feeding and for 1 hour after the feeding or medication administration.  If continuous feedings are being given, it is best to keep the [facility] up as told by your health care provider. When ADLs (activities of daily living) are performed and the [facility] needs to be flat, be sure to turn the feeding pump off. Restart the feeding pump when the [facility] is returned to the recommended height.  Do not pull or put tension on the tube.  To prevent fluid backflow, kink the G-tube before removing the cap or disconnecting a syringe.  Check the G-tube length every day.  Measure from the insertion site to the end of the G-tube. If the length is longer than previous measurements, the tube may be coming out. Call your health care provider if you notice increasing G-tube length.  Oral care, such as brushing teeth, must be continued.  You may need to remove excess air (vent) from the G-tube. Your health care provider will tell you if this is needed.  Always call your health care provider if you have questions or problems with the G-tube. SEEK IMMEDIATE MEDICAL CARE IF:   You have severe abdominal pain, tenderness, or abdominal bloating (distension).  You have nausea or vomiting.  You are constipated or have problems moving your bowels.  The G-tube insertion site is red, swollen, has a foul smell, or has yellow or brown drainage.  You have difficulty breathing or shortness of breath.  You have a fever.  You have a large amount of feeding tube residuals.  The G-tube is clogged and cannot be  flushed. MAKE SURE YOU:   Understand these instructions.  Will watch your condition.  Will get help right away if you are not doing well or get worse. Document Released: 11/11/2001 Document Revised: 01/17/2014 Document Reviewed: 05/10/2013 [facility] Patient Information 2015 [place], [place]. This information is not intended to replace advice given to you by your health care provider. Make sure you discuss any questions you have with your health care provider.   Care of a Feeding Tube People who have trouble swallowing or cannot take food or medicine by mouth are sometimes given feeding tubes. A feeding tube can go into the nose and down to the stomach or through the skin in the abdomen and into the stomach or small bowel. Some of the names of these feeding tubes are gastrostomy tubes, PEG lines, nasogastric tubes, and gastrojejunostomy tubes.  SUPPLIES NEEDED TO CARE FOR THE TUBE SITE  Clean gloves.  Clean wash cloth, gauze pads, or soft paper towel.  Cotton swabs.  Skin barrier ointment or cream.  Soap and water.  Pre-cut foam pads or gauze (that go around the tube).  Tube tape. TUBE SITE CARE 5. Have all supplies ready and available. 6. Wash hands well. 7. Put on clean gloves. 8. Remove the soiled foam pad or gauze, if present, that is found under the tube stabilizer. Change the foam pad or gauze daily or when soiled or moist. 9. Check the skin around the tube site for redness, rash, swelling, drainage, or extra tissue growth. If you notice any of these, call your caregiver. 10. Moisten gauze and cotton swabs with water and soap. 11. Wipe the area closest to the tube (right near the stoma) with cotton swabs. Wipe the surrounding skin with moistened gauze. Rinse with water. 12. Dry the skin and stoma site with a dry gauze pad or soft paper towel. Do not use antibiotic ointments at the tube site. 13. If the skin is red, apply a skin barrier cream or ointment (such as petroleum  jelly) in a circular motion, using a cotton swab. The cream or ointment will provide a moisture barrier for the skin and helps with wound healing. 14. Apply a new pre-cut foam pad or gauze around the tube. Secure it with tape around the edges. If no drainage is present, foam pads or gauze may be left off. 15. Use tape or an anchoring device to fasten the feeding tube to the skin for comfort or as directed. Rotate where you tape the tube to avoid skin damage  from the adhesive. 16. Position the person in a semi-upright position (30-45 degree angle). 38. Throw away used supplies. 18. Remove gloves. 19. Wash hands. SUPPLIES NEEDED TO FLUSH A FEEDING TUBE  Clean gloves.  60 mL syringe (that connects to the feeding tube).  Towel.  Water. FLUSHING A FEEDING TUBE  2. Have all supplies ready and available. 3. Wash hands well. 4. Put on clean gloves. 5. Draw up 30 mL of water in the syringe. 6. Kink the feeding tube while disconnecting it from the feeding-bag tubing or while removing the plug at the end of the tube. Kinking closes the tube and prevents secretions in the tube from spilling out. 7. Insert the tip of the syringe into the end of the feeding tube. Release the kink. Slowly inject the water. 8. If unable to inject the water, the person with the feeding tube should lay on his or her left side. The tip of the tube may be against the stomach wall, blocking fluid flow. Changing positions may move the tip away from the stomach wall. After repositioning, try injecting the water again. 9. After injecting the water, remove the syringe. 10. Always flush before giving the first medicine, between medicines, and after the final medicine before starting a feeding. This prevents medicines from clogging the tube. 11. Throw away used supplies. 12. Remove gloves. 13. Wash hands. Document Released: 09/02/2005 Document Revised: 08/19/2012 Document Reviewed: 04/16/2012 [facility] Patient Information 2015  [place], [place]. This information is not intended to replace advice given to you by your health care provider. Make sure you discuss any questions you have with your health care provider.

## 2014-10-20 NOTE — ED Provider Notes (Signed)
Pt signed out to me at shift change by Antonietta Breach PA-C  Patient with history of esophageal cancer currently undergoing radiation treatments with gastrotomy tube. In agitation patient pulled out gastrotomy tube. No evidence of infection or complications.  Plan for GI to evaluate and IR to replace PEG tube.  IR successfully placed PEG tube. No immediate complications. Abdominal binder in place. Pt cooperative and family bedside to take him home. Patient is afebrile, nontoxic, and in no acute distress. Patient is appropriate for outpatient management and is stable for discharge.   Pura Spice, PA-C 10/21/14 2112  Janice Norrie, MD 10/24/14 2259

## 2014-10-20 NOTE — ED Notes (Addendum)
Pt very agitated in room. Family at bedside. Pt trying to climb out of bed and pulling at IV. Al Corpus PA notified.

## 2014-10-20 NOTE — ED Notes (Signed)
PA at bedside.

## 2014-10-20 NOTE — ED Notes (Signed)
Pt accidentally pulled feeding tube out. Family concerned because pt due pain meds at 3am and is unable to swallow.

## 2014-10-20 NOTE — ED Notes (Signed)
Kyle Bauer, [place] notified that pt has become more agitated since the ativan was given

## 2014-10-20 NOTE — ED Provider Notes (Signed)
Dr. Donne Hazel called before patient arrived to the ED. He states patient has lung and esophageal cancer in has a gastrostomy tube for medication administration, hydration and dietary supplementation. Patient has been getting confused at night and tonight he pulled out his feeding tube which have been placed only about 2 weeks ago. Due to patient being on OxyContin every 4 hours family was concerned about him not getting any pain medication until patient can have his gastrostomy tube replaced today. Dr. Donne Hazel states for [place] to not attempt to replace the gastrostomy tube that IR can do it in the morning. He wants patient to be made comfortable and he will see him in the morning.  Patient is awake however he appears to be confused. On exam of his abdomen the gastrostomy tube has been removed however the flange is still sutured in place. There is no drainage from it. There is no redness around the site.   Medical screening examination/treatment/procedure(s) were conducted as a shared visit with non-physician practitioner(s) and myself.  I personally evaluated the patient during the encounter.   EKG Interpretation None       Rolland Porter, MD, [facility] Sander   Janice Norrie, MD 10/20/14 [phone]

## 2014-10-20 NOTE — ED Notes (Signed)
Pt taken to IR for tube placement.

## 2014-10-20 NOTE — Telephone Encounter (Signed)
Patients daughter called Janace Hoard) stating pt pulled out his gtube at 1am this morning (10/20/14). Pt was taken to [facility] ER. Daugher is requesting medication to help manage pts continued agitation.  I spoke with Alfonso Ramus RN who cared for this pt in the ER. She reports gtube was replaced with no complications. Pt was given Dilaudid for pain control and Ativan for agitation. Noted pt had increased agitation with Ativan. Pt discharged from ER to return home. Notified Dr. Tammi Klippel

## 2014-10-20 NOTE — Procedures (Signed)
New 20 french balloon retention gastrostomy tube inserted into previous insertion site without immediate complications. Fluoroscopy confirms appropriate placement.

## 2014-10-20 NOTE — ED Provider Notes (Signed)
CSN: [id number]     Arrival date & time 10/20/14  0302 History   First MD Initiated Contact with Patient 10/20/14 [phone]     Chief Complaint  Patient presents with  . Feeding tube replacement     (Consider location/radiation/quality/duration/timing/severity/associated sxs/prior Treatment) HPI Comments: Patient is an 81 year old male with a history of esophageal cancer, currently undergoing radiation treatments, and hx of lung CA who presents to the ED for further evaluation of gastrostomy tube removal. Daughter states that patient has become increasingly agitated at night time which caused him to pull out his gastrostomy tube this evening. Daughter believes patient has recently started to suffer from dementia and believes his symptoms with agitation at nighttime are c/w "sundowning". This was placed by Dr. Leighton Ruff of general surgery 2 weeks ago. Patient has had no difficulty with his gastrostomy tube up until this point. Daughter was concerned about the patient not receiving pain medication this evening which prompted their presentation to the emergency department. ED notified of patient's pending arrival by Dr. Donne Hazel who recommended that gastrostomy tube the placed in the morning by interventional radiology. Patient resting comfortably in exam room. He has no complaints of pain at this time.  The history is provided by a relative. No language interpreter was used.    Past Medical History  Diagnosis Date  . Rheumatic fever age 22  . Allergic rhinitis   . Strabismus   . Cancer     lung ca dx'd 2006   Past Surgical History  Procedure Laterality Date  . Tonsillectomy    . Cholecystectomy    . Right elbow surgery    . Appendectomy    . Total hip arthroplasty      Left  . Colon resection x 2      for perforated colon-colostomy x 6 months  . Lobectomy      RUL-for adenoca T2,N0,MX  . Lul wedge resection      for adenoca with radioactive seeds  . Bilatteral vat's lung resections     . Nasal fracture surgery    . Right arm    . Laparotomy N/A 10/05/2014    Procedure: open gastrostomy tube;  Surgeon: Leighton Ruff, MD;  Location: [facility] ORS;  Service: General;  Laterality: N/A;  open G-tube   Family History  Problem Relation Age of Onset  . Other Father     cardiac arrythmia  . Heart disease Father    History  Substance Use Topics  . Smoking status: Former Smoker    Quit date: 09/17/1983  . Smokeless tobacco: Not on file  . Alcohol Use: Yes    Review of Systems  Constitutional: Negative for fever.  Respiratory: Negative for shortness of breath.   Gastrointestinal: Negative for vomiting.       Gastrostomy tube out of place  Psychiatric/Behavioral: Positive for agitation (at nighttime).  All other systems reviewed and are negative.   Allergies  Review of patient's allergies indicates no known allergies.  Home Medications   Prior to Admission medications   Medication Sig Start Date End Date Taking? Authorizing Provider  fexofenadine (ALLEGRA) 180 MG tablet Take 180 mg by mouth daily.   Yes Historical Provider, MD  magnesium citrate SOLN Take 0.5 Bottles by mouth once.   Yes Historical Provider, MD  Nutritional Supplements (FEEDING SUPPLEMENT, OSMOLITE 1.2 CAL,) LIQD Place 360 mLs into feeding tube 3 (three) times daily. 10/11/14  Yes Nita Sells, MD  ondansetron (ZOFRAN ODT) 4 MG disintegrating tablet Take 1  tablet (4 mg total) by mouth every 8 (eight) hours as needed for nausea or vomiting. 10/11/14  Yes Nita Sells, MD  oxyCODONE (OXY IR/ROXICODONE) 5 MG immediate release tablet Take 1-2 tablets (5-10 mg total) by mouth every 4 (four) hours as needed for moderate pain, severe pain or breakthrough pain. 10/14/14  Yes Lora Paula, MD  Water For Irrigation, Sterile (FREE WATER) SOLN Place 240 mLs into feeding tube every 8 (eight) hours. 10/11/14  Yes Nita Sells, MD  Aspirin-Acetaminophen (GOODYS BODY PAIN PO) Take 1 packet by mouth  every 6 (six) hours as needed (for pain).     Historical Provider, MD  HYDROcodone-acetaminophen (NORCO) 7.5-325 MG per tablet Take 1 tablet by mouth 3 (three) times daily as needed for moderate pain.    Historical Provider, MD   BP 129/68 mmHg  Pulse 96  Temp(Src) 97.7 F (36.5 C) (Oral)  Resp 15  SpO2 94%   Physical Exam  Constitutional: He appears well-developed and well-nourished. No distress.  Nontoxic/nonseptic appearing  HENT:  Head: Normocephalic and atraumatic.  No drooling noted  Eyes: Conjunctivae and EOM are normal. No scleral icterus.  Neck: Normal range of motion.  Cardiovascular: Normal rate, regular rhythm and intact distal pulses.   Pulmonary/Chest: Effort normal. No respiratory distress. He has no wheezes. He has no rales.  Respirations even and unlabored  Abdominal: Soft. There is no tenderness. There is no rebound.  Retention ring present in the L mid abdomen; sutured in place. No surrounding induration or purulence. Abdomen soft, nontender.  Musculoskeletal: Normal range of motion.  Neurological: He is alert. He exhibits normal muscle tone. Coordination normal.  Patient alert and answers questions appropriately, but does appear mildly confused. Patient moving extremities without ataxia.  Skin: Skin is warm and dry. No rash noted. He is not diaphoretic. No erythema. No pallor.  Psychiatric: He has a normal mood and affect. His behavior is normal.  Nursing note and vitals reviewed.   ED Course  Procedures (including critical care time) Labs Review Labs Reviewed  I-STAT CHEM 8, ED - Abnormal; Notable for the following:    Sodium 130 (*)    Chloride 89 (*)    Glucose, Bld 125 (*)    All other components within normal limits  URINALYSIS, ROUTINE W REFLEX MICROSCOPIC    Imaging Review No results found.   EKG Interpretation None      MDM   Final diagnoses:  Gastrostomy complication    81 year old male present to the emergency department for  problems with his gastrostomy tube. Patient noted to have retention ring in place in the left mid abdomen. He. His tube prior to arrival while agitated yesterday evening. Labs show mild dehydration. Will hydrate with IV fluids. Patient also given 0.5 mg IV Dilaudid for pain control. He has no complaints of pain on my evaluation. Plan to have gastrostomy tube placed by interventional radiology in the morning. Anticipate that patient will be able to be discharged from the ED upon completion of this procedure. Disposition to be determined by oncoming ED provider.   Filed Vitals:   10/20/14 0314  BP: 129/68  Pulse: 96  Temp: 97.7 F (36.5 C)  TempSrc: Oral  Resp: 15  SpO2: 94%       Antonietta Breach, PA-C 10/20/14 0559  Janice Norrie, MD 10/20/14 [phone]

## 2014-10-21 ENCOUNTER — Telehealth: Payer: Self-pay | Admitting: Radiation Oncology

## 2014-10-21 ENCOUNTER — Other Ambulatory Visit: Payer: Self-pay | Admitting: Radiation Oncology

## 2014-10-21 ENCOUNTER — Ambulatory Visit: Payer: Medicare Other

## 2014-10-21 DIAGNOSIS — R4689 Other symptoms and signs involving appearance and behavior: Secondary | ICD-10-CM

## 2014-10-21 DIAGNOSIS — R451 Restlessness and agitation: Secondary | ICD-10-CM

## 2014-10-21 DIAGNOSIS — F05 Delirium due to known physiological condition: Secondary | ICD-10-CM

## 2014-10-21 MED ORDER — OLANZAPINE 2.5 MG PO TABS: 2.5000 mg | ORAL_TABLET | Freq: Every day | ORAL | Status: DC

## 2014-10-21 NOTE — Telephone Encounter (Signed)
Confirmed with Marcie @ [facility] that Zyrexa can be crushed and put down a feeding tube. Per Dr. Johny Shears order called in Zyprexa 2.5 mg tablet one tablet in the morning and one tablet at night to [facility] at [place]. Called daughter, Janace Hoard. Explained script had been called in to [facility] and provided directions of use. Confirmed appointment Monday, February 8 at 1000 with Dr. Tammi Klippel and 1015 with Wadie Lessen. Angie verbalized understanding of all discussed and expressed great appreciation for the assistance.

## 2014-10-21 NOTE — Telephone Encounter (Signed)
Returned message left by Janace Hoard, patient's daughter. She reports her father has become increasingly agitated, confused and combative. Explains the only medications she is putting down his feeding tube is oxycodone, allegra and zofran. She explains he pulled out his feeding tube yesterday and they had to go to the ED to have it put back. She is requesting a medication to calm her father. ED notes confirmed Ativan increased anxiety.

## 2014-10-24 ENCOUNTER — Encounter: Payer: Self-pay | Admitting: Radiation Oncology

## 2014-10-24 ENCOUNTER — Ambulatory Visit
Admission: RE | Admit: 2014-10-24 | Discharge: 2014-10-24 | Disposition: A | Discharge status: Home / Self Care | Payer: Medicare Other | Source: Ambulatory Visit | Attending: Radiation Oncology | Admitting: Radiation Oncology

## 2014-10-24 ENCOUNTER — Ambulatory Visit (HOSPITAL_BASED_OUTPATIENT_CLINIC_OR_DEPARTMENT_OTHER)
Admission: RE | Admit: 2014-10-24 | Discharge: 2014-10-24 | Disposition: A | Discharge status: Home / Self Care | Payer: Medicare Other | Source: Ambulatory Visit | Attending: Radiation Oncology | Admitting: Radiation Oncology

## 2014-10-24 ENCOUNTER — Ambulatory Visit: Payer: Medicare Other

## 2014-10-24 ENCOUNTER — Telehealth: Payer: Self-pay | Admitting: *Deleted

## 2014-10-24 VITALS — BP 118/67 | HR 107 | Temp 98.1°F | Resp 16

## 2014-10-24 DIAGNOSIS — F05 Delirium due to known physiological condition: Secondary | ICD-10-CM

## 2014-10-24 DIAGNOSIS — B3781 Candidal esophagitis: Secondary | ICD-10-CM

## 2014-10-24 DIAGNOSIS — C139 Malignant neoplasm of hypopharynx, unspecified: Secondary | ICD-10-CM

## 2014-10-24 DIAGNOSIS — Z515 Encounter for palliative care: Secondary | ICD-10-CM

## 2014-10-24 DIAGNOSIS — R451 Restlessness and agitation: Secondary | ICD-10-CM

## 2014-10-24 DIAGNOSIS — B370 Candidal stomatitis: Secondary | ICD-10-CM

## 2014-10-24 DIAGNOSIS — R4689 Other symptoms and signs involving appearance and behavior: Secondary | ICD-10-CM

## 2014-10-24 DIAGNOSIS — Z66 Do not resuscitate: Secondary | ICD-10-CM

## 2014-10-24 MED ORDER — OXYCODONE HCL 5 MG PO TABS
5.0000 mg | ORAL_TABLET | ORAL | Status: AC | PRN
Start: 2014-10-24 — End: ?

## 2014-10-24 MED ORDER — OXYCODONE HCL 5 MG PO TABS: 5.0000 mg | ORAL_TABLET | ORAL | Status: DC | PRN

## 2014-10-24 MED ORDER — OLANZAPINE 5 MG PO TABS: 5.0000 mg | ORAL_TABLET | Freq: Two times a day (BID) | ORAL | Status: AC

## 2014-10-24 MED ORDER — OLANZAPINE 5 MG PO TABS: 5.0000 mg | ORAL_TABLET | Freq: Two times a day (BID) | ORAL | Status: DC

## 2014-10-24 NOTE — Addendum Note (Signed)
Encounter addended by: Heywood Footman, RN on: 10/24/2014  1:25 PM<BR>     Documentation filed: [facility] VN

## 2014-10-24 NOTE — Progress Notes (Signed)
Gurgling noted. Receiving nutrition via g tube only. Patient confused. Patient not oriented to place or time. Daughter giving Zyprexa 2.5 tid within the last two days because of agitation and combativeness. Productive cough with white sputum. Right neck nodule remains unchanged. Denies pain. Vitals stable. Daughter are concern vision is diminished.

## 2014-10-24 NOTE — Consult Note (Signed)
Patient FX:TKWIO W Boghosian      DOB: [birth year]      XBD:[id number]     Consult Note from the Palliative Medicine Team at [place] [facility] Requested by: Dr Tammi Klippel    PCP: Leonard Downing, MD Reason for Consultation: Clarification of [place] and options              Phone                                                                                                                                [phone]  Assessment of patients Current state:   Continued physical, functional and cognitive decline 2/2 to disease progression of lung cancer.  Patient and family faced with advanced directive decisions and anticipatory care needs.  Consult is for introduction to the concept of Palliative Medicine, clarification of Advanced Directives,  holistic support and symptom management as indicated   This NP Wadie Lessen reviewed medical records, received report from team, assessed the patient and then meet with  patietn and family to include his three daughter  Duanne Guess and Arlyss Queen # # [phone]               in the outpatient oncology clinic.   A detailed discussion was had today regarding advanced directives.  Concepts specific to code status, artifical feeding and hydration, continued IV antibiotics and rehospitalization was had.  The difference between a aggressive medical intervention path  and a palliative comfort care path for this patient at this time was had.  Values and goals of care important to patient and family were attempted to be elicited.  Concepts of Hospice and Palliative Care were discussed  Natural trajectory and expectations at EOL were discussed.  Questions and concerns addressed.  Hard Choices booklet left for review. Family encouraged to call with questions or concerns.  PMT will continue to support holistically.    Goals of Care: 1.  Code Status:  DNR/DNI-comfort is main focus of care   2. Scope of Treatment:  Focus is comfort and  quality.  They are open to hospice services in the home, will trigger referral.  Depending on the next few weeks they will f/u with Dr Tammi Klippel for further radiation hoping for increased quality.  4. Symptom Management:   1. Agitation:       -Increase Zyprexa to 5 mg po bid      -Discussed safety in the home 2.  Sore throat/Thrush: Diflucan 150 mg via tube/one time   5. Psychosocial:  Emotional support offered to patient and his family.  Patient and his family able to verbalize understanding of limited prognosis. Patient's wife died 7 months ago, all family members experiencing complicated grief with multiple losses.      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  Per family patient is declining quickly and now with significant agitated behavior, worsening at night.  ROS: per family, increased weakness, increased oral secretions, increased agitation (improved on Zyprexa)   PMH:  Past Medical History  Diagnosis Date  . Rheumatic fever age 89  . Allergic rhinitis   . Strabismus   . Cancer     lung ca dx'd 2006     PSH: Past Surgical History  Procedure Laterality Date  . Tonsillectomy    . Cholecystectomy    . Right elbow surgery    . Appendectomy    . Total hip arthroplasty      Left  . Colon resection x 2      for perforated colon-colostomy x 6 months  . Lobectomy      RUL-for adenoca T2,N0,MX  . Lul wedge resection      for adenoca with radioactive seeds  . Bilatteral vat's lung resections    . Nasal fracture surgery    . Right arm    . Laparotomy N/A 10/05/2014    Procedure: open gastrostomy tube;   Surgeon: Leighton Ruff, MD;  Location: [facility];  Service: General;  Laterality: N/A;  open G-tube   I have reviewed the [place] and [facility] and  If appropriate update it with new information. No Known Allergies Scheduled Meds: Continuous Infusions: PRN Meds:.    There were no vitals taken for this visit.     No intake or output data in the 24 hours ending 10/24/14 1238  Physical Exam:  General: chronically ill appearing, weak, NAD HEENT: noted eurythermic oral pharynx with white patchy areas   Labs: CBC    Component Value Date/Time   WBC 17.7* 10/10/2014 0413   WBC 19.1* 09/29/2014 0936   RBC 3.95* 10/10/2014 0413   RBC 4.70 09/29/2014 0936   HGB 14.3 10/20/2014 0516   HGB 15.3 09/29/2014 0936   HCT 42.0 10/20/2014 0516   HCT 44.2 09/29/2014 0936   PLT 498* 10/10/2014 0413   PLT 433* 09/29/2014 0936   MCV 94.9 10/10/2014 0413   MCV 94.0 09/29/2014 0936   MCH 31.9 10/10/2014 0413   MCH 32.6 09/29/2014 0936   MCHC 33.6 10/10/2014 0413   MCHC 34.6 09/29/2014 0936   RDW 13.0 10/10/2014 0413   RDW 13.3 09/29/2014 0936   LYMPHSABS 1.2 10/08/2014 0525   LYMPHSABS 1.0 09/29/2014 0936   MONOABS 1.6* 10/08/2014 0525   MONOABS 2.3* 09/29/2014 0936   EOSABS 0.2 10/08/2014 0525   EOSABS 0.0 09/29/2014 0936   BASOSABS 0.1 10/08/2014 0525   BASOSABS 0.0 09/29/2014 0936    BMET    Component Value Date/Time   NA 130* 10/20/2014 0516   NA 135* 09/29/2014 0937   NA 142 08/26/2011 1108   K 4.3 10/20/2014 0516   K 4.1 09/29/2014 0937   K 4.3 08/26/2011 1108   CL 89* 10/20/2014 0516   CL 101 08/24/2012 0954   CL 102 08/26/2011 1108   CO2 30 10/10/2014 0413   CO2 29 09/29/2014 0937   CO2 27 08/26/2011 1108   GLUCOSE 125* 10/20/2014 0516   GLUCOSE 138 09/29/2014 0937   GLUCOSE 118* 08/24/2012 0954   GLUCOSE 109  08/26/2011 1108   BUN 10 10/20/2014 0516   BUN 21.3 09/29/2014 0937   BUN 14 08/26/2011 1108   CREATININE 0.60 10/20/2014 0516   CREATININE 0.8 09/29/2014 0937    CREATININE 0.9 08/26/2011 1108   CALCIUM 8.7 10/10/2014 0413   CALCIUM 10.0 09/29/2014 0937   CALCIUM 9.2 08/26/2011 1108   GFRNONAA >90 10/10/2014 0413   GFRAA >90 10/10/2014 0413    CMP     Component Value Date/Time   NA 130* 10/20/2014 0516   NA 135* 09/29/2014 0937   NA 142 08/26/2011 1108   K 4.3 10/20/2014 0516   K 4.1 09/29/2014 0937   K 4.3 08/26/2011 1108   CL 89* 10/20/2014 0516   CL 101 08/24/2012 0954   CL 102 08/26/2011 1108   CO2 30 10/10/2014 0413   CO2 29 09/29/2014 0937   CO2 27 08/26/2011 1108   GLUCOSE 125* 10/20/2014 0516   GLUCOSE 138 09/29/2014 0937   GLUCOSE 118* 08/24/2012 0954   GLUCOSE 109 08/26/2011 1108   BUN 10 10/20/2014 0516   BUN 21.3 09/29/2014 0937   BUN 14 08/26/2011 1108   CREATININE 0.60 10/20/2014 0516   CREATININE 0.8 09/29/2014 0937   CREATININE 0.9 08/26/2011 1108   CALCIUM 8.7 10/10/2014 0413   CALCIUM 10.0 09/29/2014 0937   CALCIUM 9.2 08/26/2011 1108   PROT 5.9* 10/08/2014 0525   PROT 7.1 09/29/2014 0937   PROT 6.9 08/26/2011 1108   ALBUMIN 2.5* 10/08/2014 0525   ALBUMIN 3.0* 09/29/2014 0937   AST 18 10/08/2014 0525   AST 19 09/29/2014 0937   AST 31 08/26/2011 1108   ALT 19 10/08/2014 0525   ALT 12 09/29/2014 0937   ALT 25 08/26/2011 1108   ALKPHOS 86 10/08/2014 0525   ALKPHOS 127 09/29/2014 0937   ALKPHOS 87* 08/26/2011 1108   BILITOT 0.5 10/08/2014 0525   BILITOT 0.90 09/29/2014 0937   BILITOT 1.20 08/26/2011 1108   GFRNONAA >90 10/10/2014 0413   GFRAA >90 10/10/2014 0413   ECOG PERFORMANCE STATUS* (Eastern Cooperative Oncology Group)  0 Fully active, able to continue with all pre-disease activities without restriction. Pt score  1 Restricted in physically strenuous activity but ambulatory and able to carry out work of a light or sedentary nature, e.g., light house work, office work.   2 Ambulatory and capable of all self-care but unable to carry out any work activities. Up and about more than 50% of waking  hours.    3 Capable of only limited self-care. Confined to bed or chair more than 50% of waking hours. 3  4 Completely disabled. Cannot carry on any self-care. Totally confined to bed or chair.   5 Dead.    As published in Am. J. Clin. Oncol.: Eustace Pen, M.M., Colon Flattery., [place], D.C., [place], Sharen Hint., Drexel Iha, P.P.: Toxicity And Response Criteria Of [facility]. [place] 5:784-696, 1982.  The ECOG Performance Status is in the public domain therefore available for public use. To duplicate the scale, please cite the reference above and credit the [facility], Tyler Pita M.D., Group Chair    Time In Time Out Total Time Spent with Patient Total Overall Time  1000 1115 70 min 75 min    Greater than 50%  of this time was spent counseling and coordinating care related to the above assessment and plan.   Wadie Lessen NP  Palliative Medicine Team Team Phone # [phone] Pager [phone]  Discussed  with Dr Tammi Klippel

## 2014-10-24 NOTE — Progress Notes (Signed)
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 Squamous cell cancer of hypopharynx 148.9 C13.9 oxyCODONE (OXY IR/ROXICODONE) 5 MG immediate release tablet     DISCONTINUED: oxyCODONE (OXY IR/ROXICODONE) 5 MG immediate release tablet  2. Acute confusional state 293.0 F05 OLANZapine (ZYPREXA) 5 MG tablet     DISCONTINUED: OLANZapine (ZYPREXA) 5 MG tablet  3. Combative behavior 780.99 R46.89 OLANZapine (ZYPREXA) 5 MG tablet     DISCONTINUED: OLANZapine (ZYPREXA) 5 MG tablet  4. Agitated 307.9 R45.1 OLANZapine (ZYPREXA) 5 MG tablet     DISCONTINUED: OLANZapine (ZYPREXA) 5 MG tablet    Interval Since Last Radiation:  2  weeks  Narrative:  The patient returns today for routine follow-up.  Gurgling noted. Receiving nutrition via g tube only. Patient confused. Patient not oriented to place or time. Daughter giving Zyprexa 2.5 tid within the last two days because of agitation and combativeness. Productive cough with white sputum. Right neck nodule remains unchanged. Denies pain. Vitals stable. Daughter are concern vision is diminished.                              ALLERGIES:  has No Known Allergies.  Meds: Current Outpatient Prescriptions  Medication Sig Dispense Refill  . fexofenadine (ALLEGRA) 180 MG tablet Take 180 mg by mouth daily.    . Nutritional Supplements (FEEDING SUPPLEMENT, OSMOLITE 1.2 CAL,) LIQD Place 360 mLs into feeding tube 3 (three) times daily. 1000 mL 0  . ondansetron (ZOFRAN ODT) 4 MG disintegrating tablet Take 1 tablet (4 mg total) by mouth every 8 (eight) hours as needed for nausea or vomiting. 20 tablet 0  . oxyCODONE (OXY IR/ROXICODONE) 5 MG immediate release tablet Take 1-2 tablets (5-10 mg total) by mouth  every 4 (four) hours as needed for moderate pain, severe pain or breakthrough pain. 120 tablet 0  . Water For Irrigation, Sterile (FREE WATER) SOLN Place 240 mLs into feeding tube every 8 (eight) hours. 240 mL 0  . Aspirin-Acetaminophen (GOODYS BODY PAIN PO) Take 1 packet by mouth every 6 (six) hours as needed (for pain).     Marland Kitchen HYDROcodone-acetaminophen (NORCO) 7.5-325 MG per tablet Take 1 tablet by mouth 3 (three) times daily as needed for moderate pain.    . magnesium citrate SOLN Take 0.5 Bottles by mouth once.    Marland Kitchen OLANZapine (ZYPREXA) 5 MG tablet Take 1 tablet (5 mg total) by mouth 2 (two) times daily. Take one tablet each morning and one tablet at night. 60 tablet 3   No current facility-administered medications for this encounter.    Physical Findings: The patient is in no acute distress. Patient is alert and oriented.  oral temperature is 98.1 F (36.7 C). His blood pressure is 118/67 and his pulse is 107. His respiration is 16 and oxygen saturation is 92%. .  No significant changes.  Lab Findings: Lab Results  Component Value Date   WBC 17.7* 10/10/2014   HGB 14.3 10/20/2014   HCT 42.0 10/20/2014   MCV 94.9 10/10/2014   PLT 498* 10/10/2014    @LASTCHEM @  Radiographic Findings: Dg Chest 2 View  10/06/2014   CLINICAL DATA:  Followup pneumonia  EXAM: CHEST  2 VIEW  COMPARISON:  10/01/2014  FINDINGS: Cardiomediastinal silhouette is stable. Persistent small right pleural effusion. There is right base posteriorly atelectasis or infiltrate. Stable postsurgical changes left upper lobe. Again noted left lower lobe nodules.  IMPRESSION: Persistent small right pleural effusion. Right base posterior atelectasis or infiltrate. No pulmonary edema. Again noted left lower lobe nodules.   Electronically Signed   By: Lahoma Crocker M.D.   On: 10/06/2014 08:50   Dg Chest 2 View  10/01/2014   CLINICAL DATA:  CXR for aspiration into airway. Pt complains of right sided neck swelling, generalized SOB,  coughing. Hx of bilateral lung cancer, strabismus, allergic rhinitis, rheumatic fever. Prior RUL lobectomy, LUL wedge resection w/ radioactive seed, Bilateral VAT's resections. Former smoker.  EXAM: CHEST  2 VIEW  COMPARISON:  PET-CT dated 09/07/2014.  FINDINGS: Grossly normal-sized heart. Interval small right pleural effusion.a previously demonstrated left lower lobe lung nodules. Left upper chest surgical clips and staples. Cholecystectomy clips. Old, healed left clavicle fracture.  IMPRESSION: 1. Interval small right pleural effusion. 2. Previously noted left lower lobe nodules.   Electronically Signed   By: Enrique Sack M.D.   On: 10/01/2014 14:11   Ct Soft Tissue Neck W Contrast  10/01/2014   CLINICAL DATA:  Choking on spit, shortness of breath, lung cancer 2006, recently diagnosed with throat cancer with neck mass, difficulty clearing secretions  EXAM: CT NECK WITH CONTRAST  TECHNIQUE: Multidetector CT imaging of the neck was performed using the standard protocol following the bolus administration of intravenous contrast. Sagittal and coronal MPR images reconstructed from axial data set.  CONTRAST:  132mL OMNIPAQUE IOHEXOL 300 MG/ML  SOLN IV  COMPARISON:  PET-CT 09/07/2014  FINDINGS: Pharynx and larynx: Large soft tissue mass identified at the RIGHT lateral aspect of the hypopharynx, 2.7 x 2.7 x 3.6 cm in size and extending into the RIGHT lateral aspect of the upper larynx. Mass extends posterior to the larynx into the upper esophagus where it measures approximately 4.1 x 2.0 cm image 70 extending 3.6 cm length. Mass extends through the laryngeal cartilage laterally. Mass corresponds to area of hyper metabolism on recent PET-CT.  Salivary glands: Symmetric normal appearing parotid and submandibular glands.  Thyroid: Normal appearing LEFT thyroid lobe. Compression and mild displacement of the upper pole of the RIGHT thyroid lobe by extrinsic fluid collection.  Lymph nodes: Fluid collection containing foci of  gas lateral to the RIGHT hypopharynx mass question necrotic versus abscessed lymph node 2.4 x 2.2 x 4.2 cm or potentially an unusual lateral hypo pharyngeal diverticulum. More cranially, a fluid collection with an adjacent enhancing soft tissue nodule is identified the anterior margin of the RIGHT sternocleidomastoid, chronic tumor versus necrotic lymph node, 3.5 x 3.5 x 3.5 cm. Additional normal sized anterior cervical lymph nodes bilaterally.  Vascular: Significant scattered atherosclerotic calcifications in the carotid systems including carotid bifurcations and carotid siphons. Compression of the RIGHT jugular vein. Major vascular structures appear patent.  Limited intracranial: Unremarkable  Mastoids and visualized paranasal sinuses: Clear. Concha bullosa middle turbinates. Middle ear cavities appear clear bilaterally.  Skeleton: Degenerative disc and facet disease changes cervical spine. No osseous metastases.  Upper chest: 6 mm RIGHT upper lobe nodule image 30 previously 5 mm. Postsurgical changes and anterior LEFT upper lobe with brachytherapy seed implants and scarring. Ground-glass nodule LEFT upper lobe 14 x 10 mm unchanged.  IMPRESSION: Large trans spatial tumor involving the RIGHT lateral aspect the hypopharynx and RIGHT piriform sinus region, upper esophagus,  and extending into the RIGHT larynx.  This tumor may either represent a hypo pharyngeal or esophageal origin.  Mass is likely obstructing the esophagus/hypopharynx.  Partially necrotic metastatic lymph node at anterior RIGHT sternocleidomastoid muscle.  Additional gas and fluid collection lateral to the tumor at the level of the upper larynx could represent a necrotic or abscessed nodal disease or an unusual hypo pharyngeal diverticulum.  Minimal increase in size of a RIGHT upper lobe nodule question metastasis.  Stable ground-glass nodule at LEFT apex.   Electronically Signed   By: Lavonia Dana M.D.   On: 10/01/2014 17:01   Ir Replc  Gastro/colonic Tube Percut W/fluoro  10/20/2014   CLINICAL DATA:  Patient with history of esophageal and lung carcinoma and surgically placed balloon retention gastrostomy tube on 10/05/2014. The tube was inadvertently removed by the patient earlier this morning and request is now made for tube replacement  EXAM: GASTROSTOMY CATHETER REPLACEMENT  ANESTHESIA/SEDATION: None  MEDICATIONS: None  CONTRAST:  10 cc's of Omnipaque; fluoroscopy time 12 seconds  PROCEDURE: Following written informed consent the patient was placed supine on the exam table. The previous gastrostomy tube insertion site was recanalized utilizing a dilator and Amplatz guidewire. The tract was subsequently expanded with a 24 [place] dilator and a new 20 French balloon retention gastrostomy tube was inserted with tip coated with viscous lidocaine. Fluoroscopic guided gastrostomy tube contrast injection confirmed appropriate placement.  COMPLICATIONS: None immediate  FINDINGS: Complete dislodgement of previously placed 26 French balloon retention gastrostomy tube. Insertion site with minimal erythema.  IMPRESSION: Successful 69 French balloon retention gastrostomy tube replacement. Fluoroscopic guided contrast injection of gastrostomy tube confirms appropriate placement.  Read by: Rowe Robert, PA-C   Electronically Signed   By: Aletta Edouard M.D.   On: 10/20/2014 10:54    Impression:  The patient is recovering and ready for hospice  Plan:  Plan follow-up on 2/19 and decide about 2nd cycle of quadshot.  _____________________________________  Sheral Apley. Tammi Klippel, M.D.

## 2014-10-24 NOTE — Telephone Encounter (Signed)
Hospice referral was called in today. Ok per Dr Julien Nordmann, to be the attending MD with symptom management through hospice.  Will inform Radiation Oncology.

## 2014-10-25 ENCOUNTER — Ambulatory Visit: Payer: Medicare Other

## 2014-10-26 ENCOUNTER — Ambulatory Visit: Payer: Medicare Other

## 2014-10-27 ENCOUNTER — Ambulatory Visit: Payer: Medicare Other

## 2014-10-28 ENCOUNTER — Ambulatory Visit: Payer: Medicare Other

## 2014-10-31 ENCOUNTER — Ambulatory Visit: Payer: Medicare Other

## 2014-11-01 ENCOUNTER — Telehealth: Payer: Self-pay | Admitting: Internal Medicine

## 2014-11-01 ENCOUNTER — Ambulatory Visit: Payer: Medicare Other

## 2014-11-01 NOTE — Telephone Encounter (Signed)
Received death certificate 2/62/03

## 2014-11-02 ENCOUNTER — Ambulatory Visit: Payer: Medicare Other

## 2014-11-03 ENCOUNTER — Ambulatory Visit: Payer: Medicare Other

## 2014-11-04 ENCOUNTER — Ambulatory Visit: Payer: Medicare Other | Admitting: Radiation Oncology

## 2014-11-04 ENCOUNTER — Ambulatory Visit: Admission: RE | Admit: 2014-11-04 | Payer: Medicare Other | Source: Ambulatory Visit | Admitting: Radiation Oncology

## 2014-11-04 ENCOUNTER — Ambulatory Visit: Payer: Medicare Other

## 2014-11-04 DIAGNOSIS — B370 Candidal stomatitis: Secondary | ICD-10-CM | POA: Insufficient documentation

## 2014-11-04 DIAGNOSIS — Z515 Encounter for palliative care: Secondary | ICD-10-CM | POA: Insufficient documentation

## 2014-11-04 DIAGNOSIS — R451 Restlessness and agitation: Secondary | ICD-10-CM | POA: Insufficient documentation

## 2014-11-04 DIAGNOSIS — B3781 Candidal esophagitis: Secondary | ICD-10-CM

## 2014-11-04 DIAGNOSIS — Z66 Do not resuscitate: Secondary | ICD-10-CM | POA: Insufficient documentation

## 2014-11-07 ENCOUNTER — Ambulatory Visit: Payer: Medicare Other

## 2014-11-08 ENCOUNTER — Ambulatory Visit: Payer: Medicare Other

## 2014-11-09 ENCOUNTER — Ambulatory Visit: Payer: Medicare Other

## 2014-11-09 ENCOUNTER — Other Ambulatory Visit: Payer: Self-pay | Admitting: Radiation Oncology

## 2014-11-09 ENCOUNTER — Institutional Professional Consult (permissible substitution): Payer: Medicare Other | Admitting: Radiation Oncology

## 2014-11-10 ENCOUNTER — Ambulatory Visit: Payer: Medicare Other

## 2014-11-10 ENCOUNTER — Ambulatory Visit: Payer: Medicare Other | Admitting: Radiation Oncology

## 2014-11-10 ENCOUNTER — Ambulatory Visit: Payer: Medicare Other | Admitting: Internal Medicine

## 2014-11-11 ENCOUNTER — Ambulatory Visit: Payer: Medicare Other

## 2014-11-11 NOTE — Progress Notes (Signed)
  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     Indication for treatment:  Palliation       Radiation treatment dates:   10/10/2014-10/11/2014  Site/dose:   The patient's grossly evident primary tumor and gross adenopathy was treated conformally to 14.8 Gy in 4 BID fractions of 3.7 Gy.  Beams/energy: He was treated using helical intensity modulated radiotherapy delivering 6 megavolt photons. Image guidance was performed with megavoltage CT studies prior to each fraction. He was immobilized with a thermoplastic mask.  Narrative: The patient tolerated radiation treatment relatively well.     Plan: The patient has completed radiation treatment. The patient will return to radiation oncology clinic for routine followup in one month. I advised him to call or return sooner if he has any questions or concerns related to his recovery or treatment. ________________________________  Sheral Apley. Tammi Klippel, M.D.

## 2014-11-14 ENCOUNTER — Ambulatory Visit: Payer: Medicare Other

## 2014-11-15 ENCOUNTER — Ambulatory Visit: Payer: Medicare Other

## 2014-11-15 DEATH — deceased

## 2014-11-16 ENCOUNTER — Ambulatory Visit: Payer: Medicare Other

## 2014-11-17 ENCOUNTER — Ambulatory Visit: Payer: Medicare Other

## 2014-11-18 ENCOUNTER — Ambulatory Visit: Payer: Medicare Other

## 2015-01-18 ENCOUNTER — Ambulatory Visit: Payer: Medicare Other | Admitting: Internal Medicine

## 2015-05-04 ENCOUNTER — Encounter: Payer: Self-pay | Admitting: Internal Medicine

## 2016-08-19 IMAGING — MR MR HEAD WO/W CM
10 of 13 series · 34 of 48 positions shown · IV contrast (multihance)
Comparison: CT head 08/02/2005

CLINICAL DATA: Left lung cancer.  Restaging.

EXAM:
MRI HEAD WITHOUT AND WITH CONTRAST
TECHNIQUE: Multiplanar, multiecho pulse sequences of the brain and surrounding
structures were obtained without and with intravenous contrast.
CONTRAST:  16mL MULTIHANCE GADOBENATE DIMEGLUMINE 529 MG/ML IV SOLN

[Series 3: T1 · sagittal · 5.0mm · 0.47mm/px · 3 of 24 slices shown]
[im 1/24]
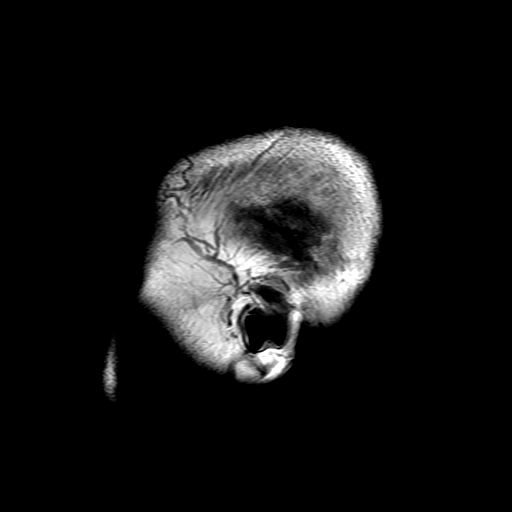
[im 12/24]
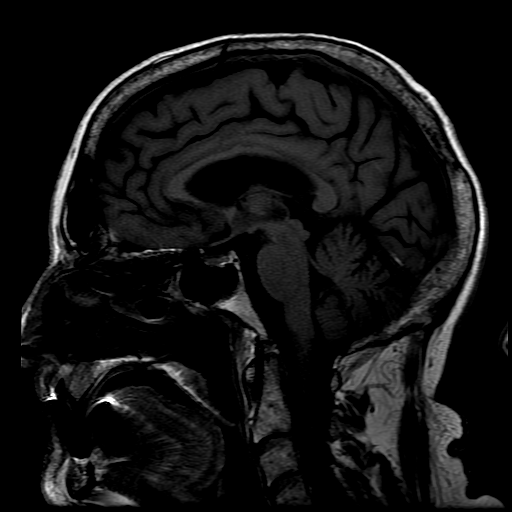
[im 24/24]
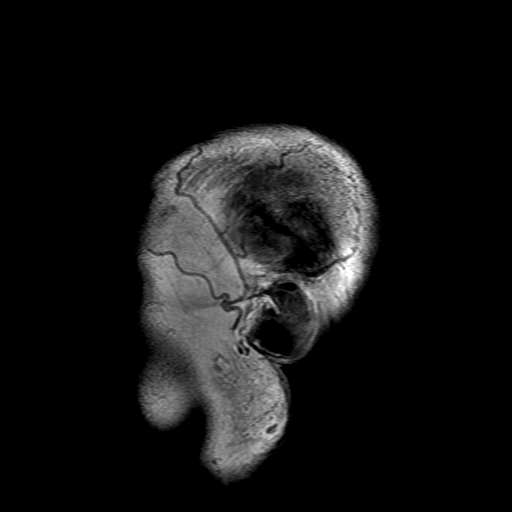

[Series 4: DWI · axial · 3.0mm · 1.09mm/px · z∈[-31,+122]mm · 8 of 104 slices shown (1 of 4)]
[im 1/104]
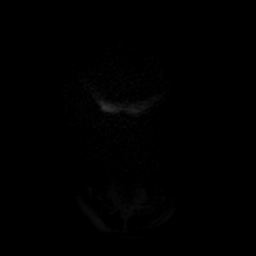
[im 12/104]
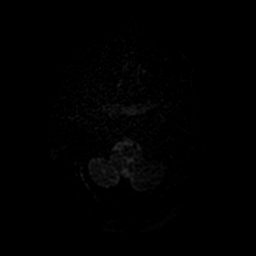
[im 35/104]
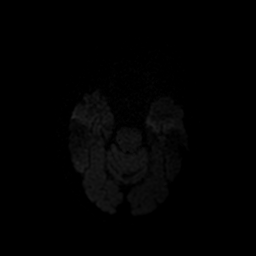
[im 46/104]
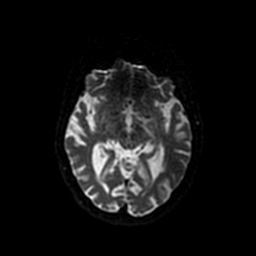
[im 58/104]
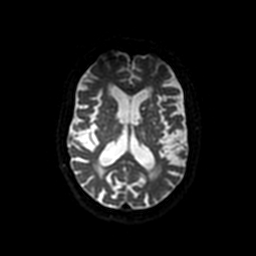
[im 69/104]
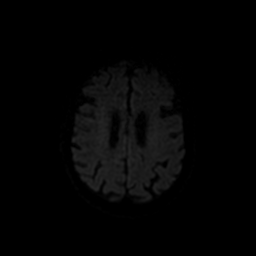
[im 92/104]
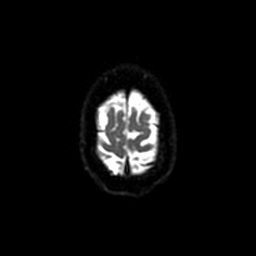
[im 104/104]
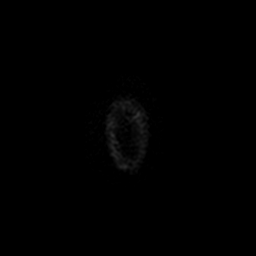

[Series 5: DWI · coronal · 5.0mm · 1.09mm/px · 6 of 68 slices shown (2 of 4)]
[im 1/68]
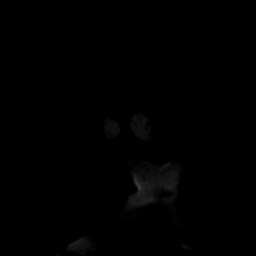
[im 14/68]
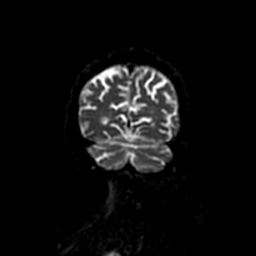
[im 27/68]
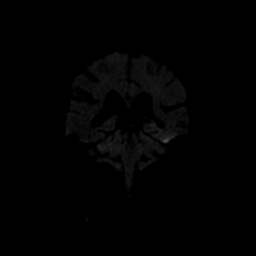
[im 41/68]
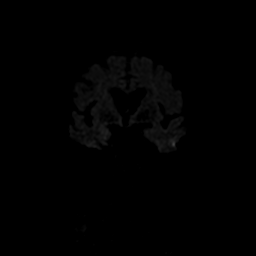
[im 54/68]
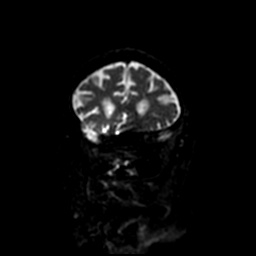
[im 68/68]
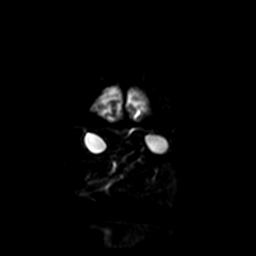

[Series 6: T2 · axial · 5.0mm · 0.43mm/px · z∈[-45,+117]mm · 2 of 26 slices shown]
[im 1/26]
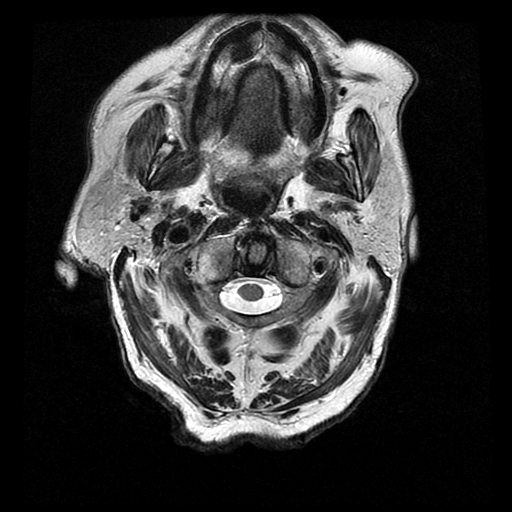
[im 26/26]
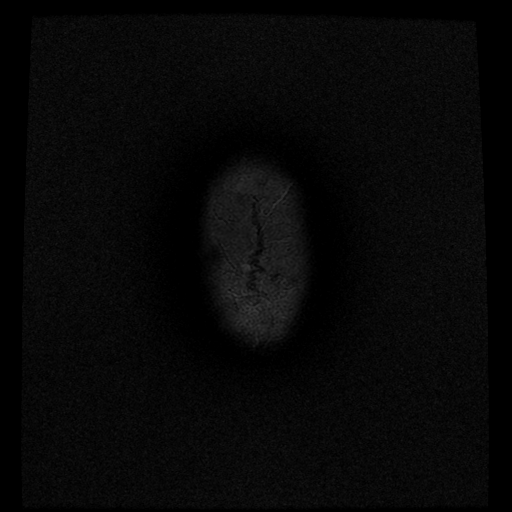

[Series 7: FLAIR · axial · 5.0mm · 0.43mm/px · z∈[-51,+123]mm · 2 of 26 slices shown]
[im 1/26]
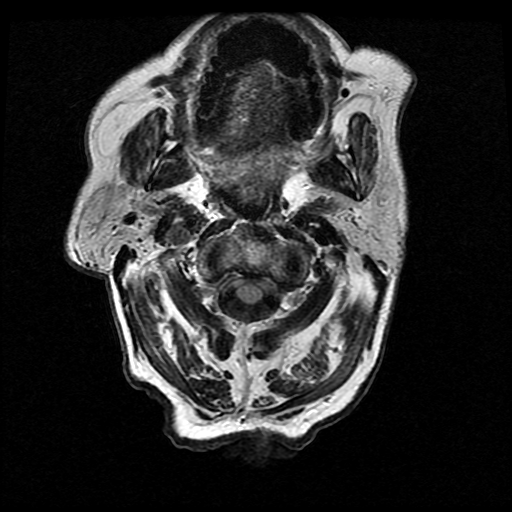
[im 26/26]
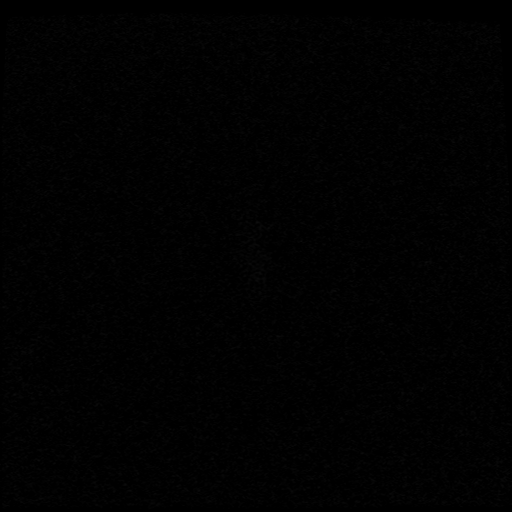

[Series 10: T2 post-contrast · coronal · 5.0mm · 0.45mm/px · 2 of 27 slices shown]
[im 1/27]
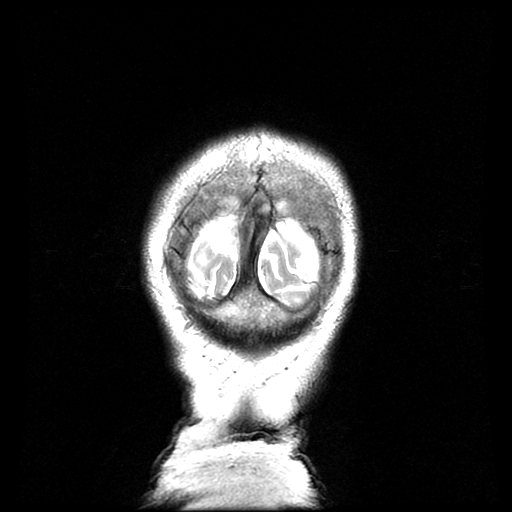
[im 27/27]
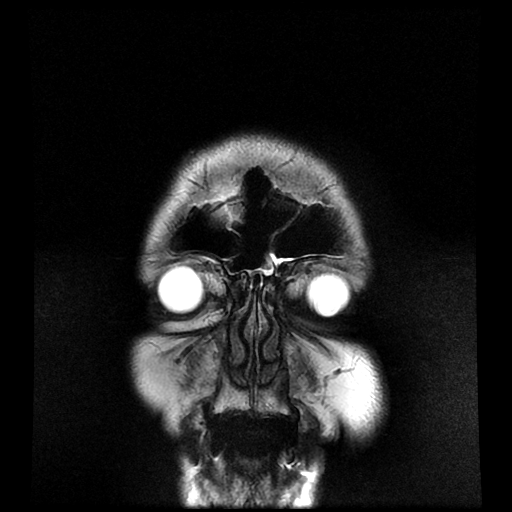

[Series 12: T1 post-contrast · coronal · 5.0mm · 0.45mm/px · 2 of 27 slices shown (1 of 2)]
[im 1/27]
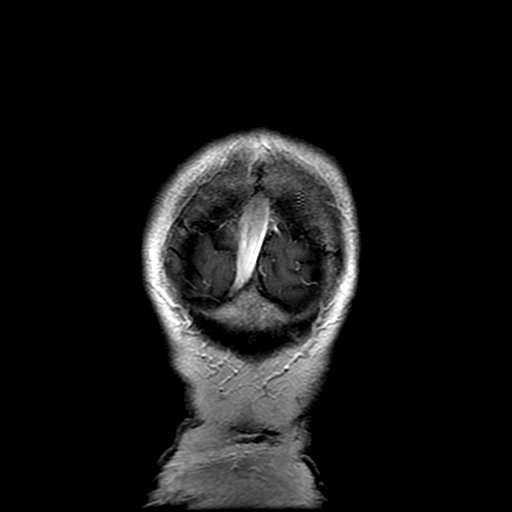
[im 27/27]
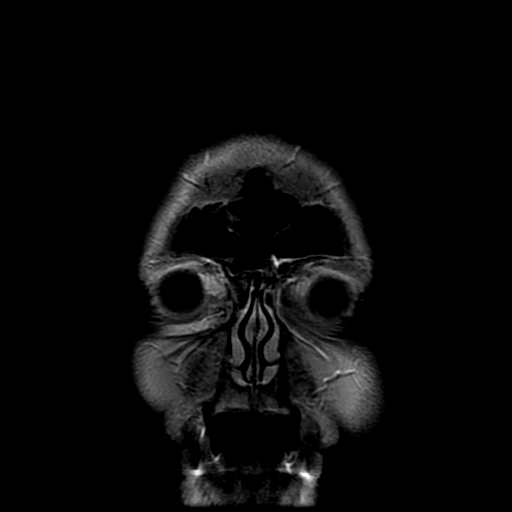

[Series 13: T1 post-contrast · sagittal · 5.0mm · 0.47mm/px · 2 of 24 slices shown (2 of 2)]
[im 1/24]
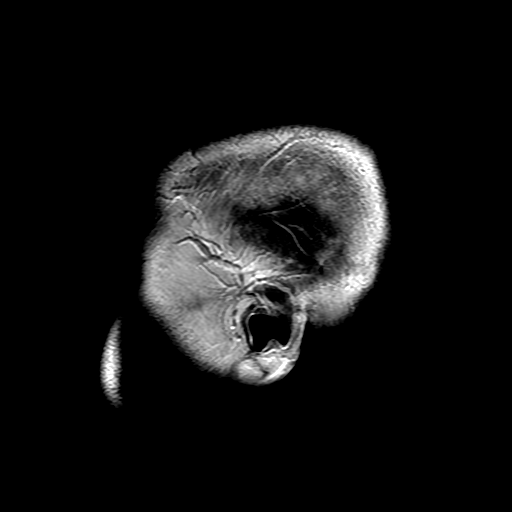
[im 24/24]
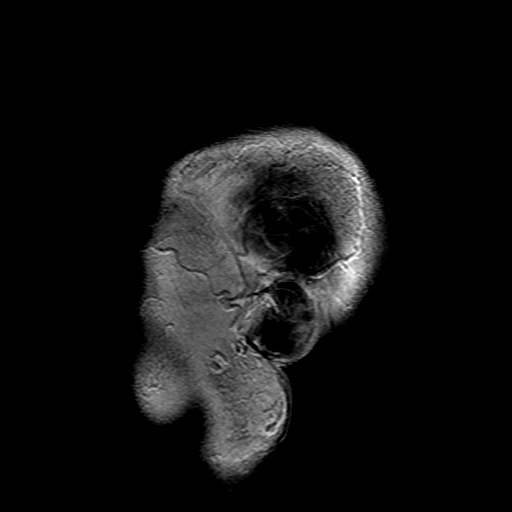

[Series 400: DWI · axial · 3.0mm · 1.09mm/px · z∈[-31,+122]mm · 4 of 52 slices shown (3 of 4)]
[im 1/52]
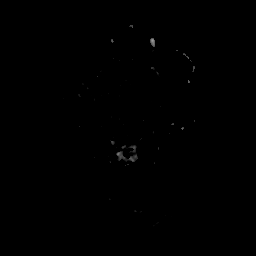
[im 18/52]
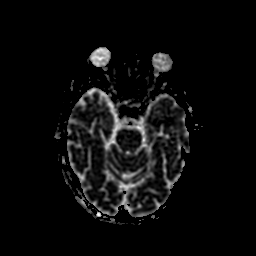
[im 35/52]
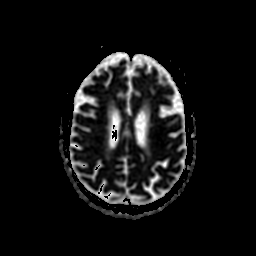
[im 52/52]
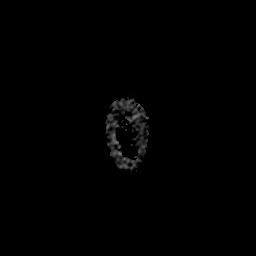

[Series 500: DWI · coronal · 5.0mm · 1.09mm/px · 3 of 34 slices shown (4 of 4)]
[im 1/34]
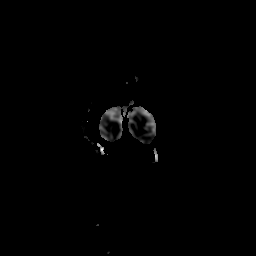
[im 17/34]
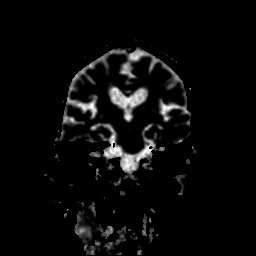
[im 34/34]
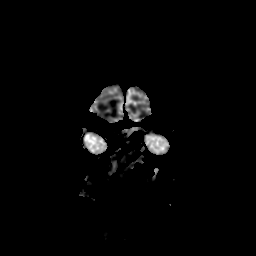

[34 of 48 positions shown; findings below may reference images not displayed]

FINDINGS: Moderate atrophy. Negative for hydrocephalus. Pituitary not
enlarged. Craniocervical junction normal. Calvarium intact.

Negative for acute infarct.

Chronic microvascular ischemic changes. Periventricular and deep
white matter hyperintensity throughout the cerebral white matter
bilaterally. Hyperintensity in the pons bilaterally. Small chronic
infarcts in the right cerebellum.

Negative for hemorrhage

Negative for mass or edema

No enhancing lesions are seen postcontrast administration.

Paranasal sinuses are clear.
IMPRESSION: Negative for metastatic disease.

Atrophy and chronic microvascular ischemia.  No acute infarct.
